# Patient Record
Sex: Male | Born: 1955 | Race: White | Hispanic: No | Marital: Single | Smoking: Never smoker
Health system: Southern US, Community
[De-identification: ages and names within clinical notes are randomized; demographics above are authoritative.]

## PROBLEM LIST (undated history)

## (undated) DIAGNOSIS — N400 Enlarged prostate without lower urinary tract symptoms: Secondary | ICD-10-CM

## (undated) DIAGNOSIS — I10 Essential (primary) hypertension: Secondary | ICD-10-CM

---

## 2006-11-12 ENCOUNTER — Emergency Department: Payer: Self-pay | Admitting: Emergency Medicine

## 2012-11-06 ENCOUNTER — Ambulatory Visit: Payer: Self-pay | Admitting: Otolaryngology

## 2012-11-14 ENCOUNTER — Emergency Department: Payer: Self-pay | Admitting: Emergency Medicine

## 2012-11-14 ENCOUNTER — Ambulatory Visit: Payer: Self-pay | Admitting: Otolaryngology

## 2012-11-14 LAB — URINALYSIS, COMPLETE
Glucose,UR: >=500 mg/dL (ref 0–75)
Ketone: NEGATIVE
Leukocyte Esterase: NEGATIVE
Nitrite: NEGATIVE
Protein: NEGATIVE
RBC,UR: 1 /HPF (ref 0–5)
Squamous Epithelial: NONE SEEN
WBC UR: <1 /HPF (ref 0–5)

## 2012-11-15 LAB — PATHOLOGY REPORT

## 2014-10-30 NOTE — Op Note (Signed)
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   POSTOPERATIVE DIAGNOSIS: right infranasal Schneiderian  papilloma.   PROCEDURE:  Resection right infranasal Schneiderian  papilloma.   SURGEON: [name], MD   DESCRIPTION OF PROCEDURE: The patient was placed in supine position on the operating room table. After general endotracheal anesthesia had been induced, the patient was turned 90 degrees counterclockwise from anesthesia and placed in the beach chair position. The right side of the nose was topically anesthetized with topical lidocaine with phenylephrine foam pledgets x 3.  Local anesthesia was injected around the mass. The patient was then prepped and draped in the usual fashion. A #15 blade was used to resect the mucosa around the lesion. The deep margin of the resection felt very firm and desmoplastic; therefore, an additional margin of cartilage where the upper lateral cartilage articulates with the dorsal septum was resected. There was ossification of the dorsal septum at that point, and it was felt as though this may represent additional desmoplastic response and potential for malignant transformation.  Therefore, these 2 pieces of tissue which were oriented for the pathologist by me were taken down and once again reviewed with the pathologist, and frozen section analysis was negative. Therefore, meticulous hemostasis was achieved in the wound. Surgiflo was placed, followed by a temporary Telfa which was taped to the side of the cheek. The patient was returned to Anesthesia, allowed to emerge from anesthesia in the operating room, and taken to the recovery room in stable condition. There were no complications.  ESTIMATED BLOOD LOSS: 10 mL.   ____________________________ J. Gertie BaronMadison Lola Czerwonka, MD [email or web address]:cb D: 11/14/2012 21:40:10 ET T: 11/14/2012 23:15:52  ET JOB#: [id number]  cc: Zackery BarefootJ. Madison Yarnell Kozloski, MD, <Dictator>

## 2014-10-30 NOTE — Op Note (Signed)
PATIENT NAME:  Cipriano BunkerSTUART, Kell R MR#:  [id number] DATE OF BIRTH:  1956-03-23  DATE OF PROCEDURE:  11/14/2012  PREOPERATIVE DIAGNOSIS: Right intranasal Schneiderian  papilloma.   POSTOPERATIVE DIAGNOSIS: right intranasal Schneiderian  papilloma.   PROCEDURE:  Resection right intranasal Schneiderian  papilloma.   SURGEON: Zackery BarefootJ. Madison Vincy Feliz, MD   DESCRIPTION OF PROCEDURE: The patient was placed in supine position on the operating room table. After general endotracheal anesthesia had been induced, the patient was turned 90 degrees counterclockwise from anesthesia and placed in the beach chair position. The right side of the nose was topically anesthetized with topical lidocaine with phenylephrine foam pledgets x 3.  Local anesthesia was injected around the mass. The patient was then prepped and draped in the usual fashion. A #15 blade was used to resect the mucosa around the lesion. The deep margin of the resection felt very firm and desmoplastic; therefore, an additional margin of cartilage where the upper lateral cartilage articulates with the dorsal septum was resected. There was ossification of the dorsal septum at that point, and it was felt as though this may represent additional desmoplastic response and potential for malignant transformation.  Therefore, these 2 pieces of tissue which were oriented for the pathologist by me were taken down and once again reviewed with the pathologist, and frozen section analysis was negative. Therefore, meticulous hemostasis was achieved in the wound. Surgiflo was placed, followed by a temporary Telfa which was taped to the side of the cheek. The patient was returned to Anesthesia, allowed to emerge from anesthesia in the operating room, and taken to the recovery room in stable condition. There were no complications.  ESTIMATED BLOOD LOSS: 10 mL.   ____________________________ J. Gertie BaronMadison Nelda Luckey, MD [email or web address]:cb D: 11/14/2012 21:40:00 ET T: 11/14/2012 23:15:52  ET JOB#: [id number]  cc: Zackery BarefootJ. Madison Jamyah Folk, MD, <Dictator> Wendee CoppJMADISON Branson Kranz MD ELECTRONICALLY SIGNED 11/26/2012 6:23

## 2015-02-04 ENCOUNTER — Ambulatory Visit
Admission: EM | Admit: 2015-02-04 | Discharge: 2015-02-04 | Disposition: A | Discharge status: Home / Self Care | Payer: BLUE CROSS/BLUE SHIELD | Attending: Internal Medicine | Admitting: Internal Medicine

## 2015-02-04 ENCOUNTER — Encounter: Payer: Self-pay | Admitting: Emergency Medicine

## 2015-02-04 DIAGNOSIS — R519 Headache, unspecified: Secondary | ICD-10-CM

## 2015-02-04 DIAGNOSIS — R51 Headache: Secondary | ICD-10-CM | POA: Diagnosis not present

## 2015-02-04 DIAGNOSIS — G43909 Migraine, unspecified, not intractable, without status migrainosus: Secondary | ICD-10-CM | POA: Diagnosis present

## 2015-02-04 DIAGNOSIS — T673XXA Heat exhaustion, anhydrotic, initial encounter: Secondary | ICD-10-CM

## 2015-02-04 DIAGNOSIS — I10 Essential (primary) hypertension: Secondary | ICD-10-CM | POA: Insufficient documentation

## 2015-02-04 DIAGNOSIS — N400 Enlarged prostate without lower urinary tract symptoms: Secondary | ICD-10-CM | POA: Insufficient documentation

## 2015-02-04 DIAGNOSIS — X58XXXA Exposure to other specified factors, initial encounter: Secondary | ICD-10-CM | POA: Diagnosis not present

## 2015-02-04 DIAGNOSIS — R1110 Vomiting, unspecified: Secondary | ICD-10-CM | POA: Diagnosis present

## 2015-02-04 HISTORY — DX: Benign prostatic hyperplasia without lower urinary tract symptoms: N40.0

## 2015-02-04 HISTORY — DX: Essential (primary) hypertension: I10

## 2015-02-04 LAB — COMPREHENSIVE METABOLIC PANEL
ALT: 18 U/L (ref 17–63)
ANION GAP: 9 (ref 5–15)
AST: 18 U/L (ref 15–41)
Albumin: 4.3 g/dL (ref 3.5–5.0)
Alkaline Phosphatase: 49 U/L (ref 38–126)
BUN: 19 mg/dL (ref 6–20)
CHLORIDE: 99 mmol/L — AB (ref 101–111)
CO2: 27 mmol/L (ref 22–32)
Calcium: 9.3 mg/dL (ref 8.9–10.3)
Creatinine, Ser: 0.84 mg/dL (ref 0.61–1.24)
GFR calc Af Amer: >60 mL/min (ref >60)
GFR calc non Af Amer: >60 mL/min (ref >60)
Glucose, Bld: 166 mg/dL — ABNORMAL HIGH (ref 65–99)
Potassium: 4.4 mmol/L (ref 3.5–5.1)
SODIUM: 135 mmol/L (ref 135–145)
Total Bilirubin: 1.3 mg/dL — ABNORMAL HIGH (ref 0.3–1.2)
Total Protein: 7.6 g/dL (ref 6.5–8.1)

## 2015-02-04 LAB — CBC WITH DIFFERENTIAL/PLATELET
BASOS ABS: 0 10*3/uL (ref 0–0.1)
BASOS PCT: 0 %
EOS PCT: 0 %
Eosinophils Absolute: 0 10*3/uL (ref 0–0.7)
HEMATOCRIT: 41.2 % (ref 40.0–52.0)
Hemoglobin: 14.2 g/dL (ref 13.0–18.0)
LYMPHS ABS: 1 10*3/uL (ref 1.0–3.6)
Lymphocytes Relative: 7 %
MCH: 29.8 pg (ref 26.0–34.0)
MCHC: 34.5 g/dL (ref 32.0–36.0)
MCV: 86.4 fL (ref 80.0–100.0)
MONO ABS: 0.6 10*3/uL (ref 0.2–1.0)
Monocytes Relative: 4 %
NEUTROS PCT: 89 %
Neutro Abs: 11.9 10*3/uL — ABNORMAL HIGH (ref 1.4–6.5)
Platelets: 177 10*3/uL (ref 150–440)
RBC: 4.77 MIL/uL (ref 4.40–5.90)
RDW: 12.7 % (ref 11.5–14.5)
WBC: 13.5 10*3/uL — AB (ref 3.8–10.6)

## 2015-02-04 MED ORDER — KETOROLAC TROMETHAMINE 60 MG/2ML IM SOLN
60.0000 mg | Freq: Once | INTRAMUSCULAR | Status: AC
  Administered 2015-02-04: 60 mg via INTRAMUSCULAR

## 2015-02-04 MED ORDER — ONDANSETRON HCL 4 MG/2ML IJ SOLN
8.0000 mg | Freq: Once | INTRAMUSCULAR | Status: AC
  Administered 2015-02-04: 8 mg via INTRAMUSCULAR

## 2015-02-04 MED ORDER — SODIUM CHLORIDE 0.9 % IV BOLUS (SEPSIS)
1000.0000 mL | Freq: Once | INTRAVENOUS | Status: AC
  Administered 2015-02-04: 1000 mL via INTRAVENOUS

## 2015-02-04 NOTE — ED Notes (Signed)
Pt states that he has a headache and he has a history of them but nothing like the past day.he states that it has caused him to be very sick on his stomach

## 2015-02-04 NOTE — ED Provider Notes (Signed)
CSN: [id number]     Arrival date & time 02/04/15  1352 History   First MD Initiated Contact with Patient 02/04/15 1513     Chief Complaint  Patient presents with  . Migraine  . Emesis   HPI 59 year old gentleman with past medical history notable for hypertension, "sick" headaches. He presents today after the onset of a really bad headache last evening. Headache similar to previous headaches, and actually improving, but has been vomiting since 10 AM today. The vomiting makes his head feel like it's going to blow open. Additional history is that he works in an [employer] site, in the last 3 days have been extremely hot.  No cough, hasn't felt like he was coming down with anything. No diarrhea. He is achy, has some neck soreness, but is able to put his chin on his chest. Headache starts in the back of his head and neck and radiates across the top of his head to the frontal area and down even around the eyes and nose. This is like other headaches that he has had in the past, but they usually only last a couple of hours, and respond to Tylenol. No focal weakness or clumsiness of an arm or leg, has not been falling down, was able to ambulate into the urgent care today. Tactile temperature last night, resolved now. No speaking or swallowing difficulty, no big change in vision.  Past Medical History  Diagnosis Date  . Hypertension   . BPH (benign prostatic hyperplasia)   "sick" headaches  History reviewed. No pertinent past surgical history. History reviewed. No pertinent family history. History  Substance Use Topics  . Smoking status: Never Smoker   . Smokeless tobacco: Not on file  . Alcohol Use: No    Review of Systems  All other systems reviewed and are negative.   Allergies  Review of patient's allergies indicates no known allergies.  Home Medications   Prior to Admission medications   Medication Sig Start Date End Date Taking? Authorizing Provider  amLODipine  (NORVASC) 5 MG tablet Take 5 mg by mouth daily.   Yes Historical Provider, MD  benazepril (LOTENSIN) 20 MG tablet Take 40 mg by mouth daily.   Yes Historical Provider, MD  finasteride (PROSCAR) 5 MG tablet Take 5 mg by mouth daily.   Yes Historical Provider, MD  tamsulosin (FLOMAX) 0.4 MG CAPS capsule Take 0.4 mg by mouth daily.   Yes Historical Provider, MD   BP 112/54 mmHg  Pulse 77  Temp(Src) 98 F (36.7 C) (Oral)  Resp 16  SpO2 99% Physical Exam  Constitutional: He is oriented to person, place, and time.  Alert, nicely groomed Reclining on stretcher  HENT:  Head: Atraumatic.  Eyes:  Conjugate gaze, no eye redness/drainage  Neck: Neck supple.  Able to put chin on chest  Cardiovascular: Normal rate and regular rhythm.   Pulmonary/Chest: No respiratory distress.  Lungs clear, symmetric breath sounds  Abdominal: Soft. He exhibits no distension. There is no tenderness. There is no guarding.  Musculoskeletal: Normal range of motion.  Neurological: He is alert and oriented to person, place, and time.  Face symmetric, speech clear/coherent  Skin: Skin is warm.  No cyanosis Slightly diaphoretic, maybe a little pale  Nursing note and vitals reviewed.  Orthostatic VS for the past 24 hrs:  BP- Lying Pulse- Lying BP- Sitting Pulse- Sitting BP- Standing at 0 minutes Pulse- Standing at 0 minutes  02/04/15 1554 114/65 mmHg 77 123/53 mmHg 83 107/55 mmHg 80  ED Course  Procedures Results for orders placed or performed during the hospital encounter of 02/04/15  Comprehensive metabolic panel  Result Value Ref Range   Sodium 135 135 - 145 mmol/L   Potassium 4.4 3.5 - 5.1 mmol/L   Chloride 99 (L) 101 - 111 mmol/L   CO2 27 22 - 32 mmol/L   Glucose, Bld 166 (H) 65 - 99 mg/dL   BUN 19 6 - 20 mg/dL   Creatinine, Ser 1.61 0.61 - 1.24 mg/dL   Calcium 9.3 8.9 - 09.6 mg/dL   Total Protein 7.6 6.5 - 8.1 g/dL   Albumin 4.3 3.5 - 5.0 g/dL   AST 18 15 - 41 U/L   ALT 18 17 - 63 U/L    Alkaline Phosphatase 49 38 - 126 U/L   Total Bilirubin 1.3 (H) 0.3 - 1.2 mg/dL   GFR calc non Af Amer >60 >60 mL/min   GFR calc Af Amer >60 >60 mL/min   Anion gap 9 5 - 15  CBC with Differential  Result Value Ref Range   WBC 13.5 (H) 3.8 - 10.6 K/uL   RBC 4.77 4.40 - 5.90 MIL/uL   Hemoglobin 14.2 13.0 - 18.0 g/dL   HCT 04.5 40.9 - 81.1 %   MCV 86.4 80.0 - 100.0 fL   MCH 29.8 26.0 - 34.0 pg   MCHC 34.5 32.0 - 36.0 g/dL   RDW 91.4 78.2 - 95.6 %   Platelets 177 150 - 440 K/uL   Neutrophils Relative % 89 %   Neutro Abs 11.9 (H) 1.4 - 6.5 K/uL   Lymphocytes Relative 7 %   Lymphs Abs 1.0 1.0 - 3.6 K/uL   Monocytes Relative 4 %   Monocytes Absolute 0.6 0.2 - 1.0 K/uL   Eosinophils Relative 0 %   Eosinophils Absolute 0.0 0 - 0.7 K/uL   Basophils Relative 0 %   Basophils Absolute 0.0 0 - 0.1 K/uL     Medications  ketorolac (TORADOL) injection 60 mg (60 mg Intramuscular Given 02/04/15 1442)  ondansetron (ZOFRAN) injection 8 mg (8 mg Intramuscular Given 02/04/15 1441)  sodium chloride 0.9 % bolus 1,000 mL (1,000 mLs Intravenous Given 02/04/15 1553)  sodium chloride 0.9 % bolus 1,000 mL (1,000 mLs Intravenous Given 02/04/15 1636)       MDM   1. Headache disorder   2. Heat exhaustion due to water depletion, initial encounter    Push fluids, rest.  Note for work tomorrow. Recheck if not continuing to improve in the next few days.    Eustace Moore, MD 02/04/15 2022

## 2015-02-04 NOTE — Discharge Instructions (Signed)
Injections of toradol (ketorolac, an anti-inflammatory) and zofran (ondansetron, for vomiting) were given today. 2 liters of normal saline iv fluid was given today. Rest and push fluids today and tomorrow; can return to work 02/06/15 if feeling ok. You should be drinking enough liquid, when it is hot outside, that urine is clear/pale yellow.  Heat-Related Illness Heat-related illnesses occur when the body is unable to properly cool itself. The body normally cools itself by sweating. However, under some conditions sweating is not enough. In these cases, a person's body temperature rises rapidly. Very high body temperatures may damage the brain or other vital organs. Some examples of heat-related illnesses include:  Heat stroke. This occurs when the body is unable to regulate its temperature. The body's temperature rises rapidly, the sweating mechanism fails, and the body is unable to cool down. Body temperature may rise to 106 F (41 C) or higher within 10 to 15 minutes. Heat stroke can cause death or permanent disability if emergency treatment is not provided.  Heat exhaustion. This is a milder form of heat-related illness that can develop after several days of exposure to high temperatures and not enough fluids. It is the body's response to an excessive loss of the water and salt contained in sweat.  Heat cramps. These usually affect people who sweat a lot during heavy activity. This sweating drains the body's salt and moisture. The low salt level in the muscles causes painful cramps. Heat cramps may also be a symptom of heat exhaustion. Heat cramps usually occur in the abdomen, arms, or legs. Get medical attention for cramps if you have heart problems or are on a low-sodium diet. Those that are at greatest risk for heat-related illnesses include:   The elderly.  Infant and the very young.  People with mental illness and chronic diseases.  People who are overweight (obese).  Young and healthy  people can even succumb to heat if they participate in strenuous physical activities during hot weather. CAUSES  Several factors affect the body's ability to cool itself during extremely hot weather. When the humidity is high, sweat will not evaporate as quickly. This prevents the body from releasing heat quickly. Other factors that can affect the body's ability to cool down include:   Age.  Obesity.  Fever.  Dehydration.  Heart disease.  Mental illness.  Poor circulation.  Sunburn.  Prescription drug use.  Alcohol use. SYMPTOMS  Heat stroke: Warning signs of heat stroke vary, but may include:  An extremely high body temperature (above 103F orally).  A fast, strong pulse.  Dizziness.  Confusion.  Red, hot, and dry skin.  No sweating.  Throbbing headache.  Feeling sick to your stomach (nauseous).  Unconsciousness. Heat exhaustion: Warning signs of heat exhaustion include:  Heavy sweating.  Tiredness.  Headache.  Paleness.  Weakness.  Feeling sick to your stomach (nauseous) or vomiting.  Muscle cramps. Heat cramps  Muscle pains or spasms. TREATMENT  Heat stroke  Get into a cool environment. An indoor place that is air-conditioned may be best.  Take a cool shower or bath. Have someone around to make sure you are okay.  Take your temperature. Make sure it is going down. Heat exhaustion  Drink plenty of fluids. Do not drink liquids that contain caffeine, alcohol, or large amounts of sugar. These cause you to lose more body fluid. Also, avoid very cold drinks. They can cause stomach cramps.  Get into a cool environment. An indoor place that is air-conditioned may be best.  Take a cool shower or bath. Have someone around to make sure you are okay.  Put on lightweight clothing. Heat cramps  Stop whatever activity you were doing. Do not attempt to do that activity for at least 3 hours after the cramps have gone away.  Get into a cool  environment. An indoor place that is air-conditioned may be best. HOME CARE INSTRUCTIONS  To protect your health when temperatures are extremely high, follow these tips:  During heavy exercise in a hot environment, drink two to four glasses (16-32 ounces) of cool fluids each hour. Do not wait until you are thirsty to drink. Warning: If your caregiver limits the amount of fluid you drink or has you on water pills, ask how much you should drink while the weather is hot.  Do not drink liquids that contain caffeine, alcohol, or large amounts of sugar. These cause you to lose more body fluid.  Avoid very cold drinks. They can cause stomach cramps.  Wear appropriate clothing. Choose lightweight, light-colored, loose-fitting clothing.  If you must be outdoors, try to limit your outdoor activity to morning and evening hours. Try to rest often in shady areas.  If you are not used to working or exercising in a hot environment, start slowly and pick up the pace gradually.  Stay cool in an air-conditioned place if possible. If your home does not have air conditioning, go to the shopping mall or [employer].  Taking a cool shower or bath may help you cool off. SEEK MEDICAL CARE IF:   You see any of the symptoms listed above. You may be dealing with a life-threatening emergency.  Symptoms worsen or last longer than 1 hour.  Heat cramps do not get better in 1 hour. MAKE SURE YOU:   Understand these instructions.  Will watch your condition.  Will get help right away if you are not doing well or get worse. Document Released: 04/04/2008 Document Revised: 09/18/2011 Document Reviewed: 04/04/2008 [facility] Patient Information 2015 [place], [place]. This information is not intended to replace advice given to you by your health care provider. Make sure you discuss any questions you have with your health care provider.

## 2015-02-12 ENCOUNTER — Encounter: Payer: Self-pay | Admitting: Family Medicine

## 2015-02-12 ENCOUNTER — Ambulatory Visit (INDEPENDENT_AMBULATORY_CARE_PROVIDER_SITE_OTHER): Payer: BLUE CROSS/BLUE SHIELD | Admitting: Family Medicine

## 2015-02-12 VITALS — BP 125/73 | HR 65 | Temp 99.1°F | Ht 69.2 in | Wt 198.4 lb

## 2015-02-12 DIAGNOSIS — R5383 Other fatigue: Secondary | ICD-10-CM | POA: Diagnosis not present

## 2015-02-12 DIAGNOSIS — R519 Headache, unspecified: Secondary | ICD-10-CM

## 2015-02-12 DIAGNOSIS — R51 Headache: Secondary | ICD-10-CM

## 2015-02-12 DIAGNOSIS — M791 Myalgia, unspecified site: Secondary | ICD-10-CM

## 2015-02-12 DIAGNOSIS — J0100 Acute maxillary sinusitis, unspecified: Secondary | ICD-10-CM | POA: Diagnosis not present

## 2015-02-12 LAB — CBC WITH DIFFERENTIAL/PLATELET
HEMATOCRIT: 39.8 % (ref 37.5–51.0)
Hemoglobin: 14.3 g/dL (ref 12.6–17.7)
LYMPHS: 23 %
Lymphocytes Absolute: 2 10*3/uL (ref 0.7–3.1)
MCH: 31.5 pg (ref 26.6–33.0)
MCHC: 35.9 g/dL — ABNORMAL HIGH (ref 31.5–35.7)
MCV: 88 fL (ref 79–97)
MID (Absolute): 0.5 10*3/uL (ref 0.1–1.6)
MID: 6 %
NEUTROS ABS: 6.1 10*3/uL (ref 1.4–7.0)
Neutrophils: 71 %
Platelets: 217 10*3/uL (ref 150–379)
RBC: 4.54 x10E6/uL (ref 4.14–5.80)
RDW: 12.6 % (ref 12.3–15.4)
WBC: 8.6 10*3/uL (ref 3.4–10.8)

## 2015-02-12 MED ORDER — AMOXICILLIN-POT CLAVULANATE 875-125 MG PO TABS: 1.0000 | ORAL_TABLET | Freq: Two times a day (BID) | ORAL | Status: DC

## 2015-02-12 NOTE — Progress Notes (Signed)
BP 125/73 mmHg  Pulse 65  Temp(Src) 99.1 F (37.3 C)  Ht 5' 9.2" (1.758 m)  Wt 198 lb 6.4 oz (89.994 kg)  BMI 29.12 kg/m2  SpO2 99%   Subjective:    Patient ID: Scott Hester, male    DOB: [birth year], 59 y.o.   MRN: [id number]  HPI: Scott Hester is a 59 y.o. male  Chief Complaint  Patient presents with  . Heat Exposure    patient states that he still does not feel well, he started vomiting again yesterday. He had a bad headache on Wednesday night, after the headache went away he started vomiting, which lasted about 12 hours.Went to UC, did x-ray, blood work, and was given fluid this past  Sunday . He also states that his legs are very sore and stiff when he stretches it out  . URI    Sinus pressure, ear ache, humming in ear, unsure if this may be contributing to the other issues.    UPPER RESPIRATORY TRACT INFECTION X 1-2 WEEKS Worst symptom: headache Fever: yes Cough: yes Shortness of breath: no Wheezing: no Chest pain: no Chest tightness: no Chest congestion: no Nasal congestion: yes Runny nose: yes Post nasal drip: yes Sneezing: no Sore throat: no Swollen glands: no Sinus pressure: yes Headache: yes Face pain: yes Toothache: yes Ear pain: no  Ear pressure: yes "right Eyes red/itching:no Eye drainage/crusting: no  Vomiting: yes Rash: no Fatigue: yes Sick contacts: no Strep contacts: no  Context: stable Recurrent sinusitis: no Relief with OTC cold/cough medications: no  Treatments attempted: none   Has been vomiting and not feeling good. Bad headaches. Works in the heat. No air conditioning at work.    Relevant past medical, surgical, family and social history reviewed and updated as indicated. Interim medical history since our last visit reviewed. Allergies and medications reviewed and updated.  Review of Systems  Constitutional: Negative.   Respiratory: Negative.   Cardiovascular: Negative.   Gastrointestinal: Positive for nausea and vomiting.  Negative for abdominal pain, diarrhea, constipation, blood in stool, abdominal distention, anal bleeding and rectal pain.  Musculoskeletal: Positive for myalgias and arthralgias. Negative for back pain, joint swelling, gait problem, neck pain and neck stiffness.  Psychiatric/Behavioral: Negative.     Per HPI unless specifically indicated above     Objective:    BP 125/73 mmHg  Pulse 65  Temp(Src) 99.1 F (37.3 C)  Ht 5' 9.2" (1.758 m)  Wt 198 lb 6.4 oz (89.994 kg)  BMI 29.12 kg/m2  SpO2 99%  Wt Readings from Last 3 Encounters:  02/12/15 198 lb 6.4 oz (89.994 kg)  09/28/14 211 lb (95.709 kg)    Physical Exam  Constitutional: He is oriented to person, place, and time. He appears well-developed and well-nourished. No distress.  HENT:  Head: Normocephalic and atraumatic.  Right Ear: Hearing normal.  Left Ear: Hearing normal.  Nose: Nose normal.  Eyes: Conjunctivae and lids are normal. Right eye exhibits no discharge. Left eye exhibits no discharge. No scleral icterus.  Cardiovascular: Normal rate, regular rhythm and normal heart sounds.  Exam reveals no gallop and no friction rub.   No murmur heard. Pulmonary/Chest: Effort normal and breath sounds normal. No respiratory distress. He has no wheezes. He has no rales. He exhibits no tenderness.  Abdominal: Soft. Bowel sounds are normal. He exhibits no distension and no mass. There is no tenderness. There is no rebound and no guarding.  Musculoskeletal: Normal range of motion.  Neurological: He is  alert and oriented to person, place, and time.  Skin: Skin is warm, dry and intact. No rash noted. No erythema. No pallor.  Psychiatric: He has a normal mood and affect. His speech is normal and behavior is normal. Judgment and thought content normal. Cognition and memory are normal.  Nursing note and vitals reviewed.   Results for orders placed or performed in visit on 02/12/15  CBC With Differential/Platelet  Result Value Ref Range    WBC 8.6 3.4 - 10.8 x10E3/uL   RBC 4.54 4.14 - 5.80 x10E6/uL   Hemoglobin 14.3 12.6 - 17.7 g/dL   Hematocrit 16.1 09.6 - 51.0 %   MCV 88 79 - 97 fL   MCH 31.5 26.6 - 33.0 pg   MCHC 35.9 (H) 31.5 - 35.7 g/dL   RDW 04.5 40.9 - 81.1 %   Platelets 217 150 - 379 x10E3/uL   Neutrophils 71 %   Lymphs 23 %   MID 6 %   Neutrophils Absolute 6.1 1.4 - 7.0 x10E3/uL   Lymphocytes Absolute 2.0 0.7 - 3.1 x10E3/uL   MID (Absolute) 0.5 0.1 - 1.6 X10E3/uL      Assessment & Plan:   Problem List Items Addressed This Visit    None    Visit Diagnoses    Acute maxillary sinusitis, recurrence not specified    -  Primary    Will treat with augmentin. Call if not getting better or getting worse.     Relevant Medications    amoxicillin-clavulanate (AUGMENTIN) 875-125 MG per tablet    Acute nonintractable headache, unspecified headache type        Possibly due to heat or due to sinusitis. Will check blood work including tick bourne illnesses. Call if not getting better.     Relevant Orders    Comprehensive metabolic panel    TSH    CBC With Differential/Platelet (Completed)    Lyme Ab/Western Blot Reflex    Babesia microti Antibody Panel    Ehrlichia Antibody Panel    Rocky mtn spotted fvr abs pnl(IgG+IgM)    Carboxyhemoglobin    Other fatigue        Advised him to push fluids and rest over the weekend.     Relevant Orders    Comprehensive metabolic panel    TSH    CBC With Differential/Platelet (Completed)    Lyme Ab/Western Blot Reflex    Babesia microti Antibody Panel    Ehrlichia Antibody Panel    Rocky mtn spotted fvr abs pnl(IgG+IgM)    Myalgia        Will check for tick bourne illnesses.     Relevant Orders    Comprehensive metabolic panel    TSH    CBC With Differential/Platelet (Completed)    Lyme Ab/Western Blot Reflex    Babesia microti Antibody Panel    Ehrlichia Antibody Panel    Rocky mtn spotted fvr abs pnl(IgG+IgM)        Follow up plan: No Follow-up on  file.

## 2015-02-13 LAB — COMPREHENSIVE METABOLIC PANEL
A/G RATIO: 1.6 (ref 1.1–2.5)
ALK PHOS: 45 IU/L (ref 39–117)
ALT: 12 IU/L (ref 0–44)
AST: 6 IU/L (ref 0–40)
Albumin: 4.1 g/dL (ref 3.5–5.5)
BILIRUBIN TOTAL: 0.7 mg/dL (ref 0.0–1.2)
BUN/Creatinine Ratio: 14 (ref 9–20)
BUN: 9 mg/dL (ref 6–24)
CHLORIDE: 98 mmol/L (ref 97–108)
CO2: 29 mmol/L (ref 18–29)
Calcium: 9.3 mg/dL (ref 8.7–10.2)
Creatinine, Ser: 0.66 mg/dL — ABNORMAL LOW (ref 0.76–1.27)
GFR calc non Af Amer: 107 mL/min/{1.73_m2} (ref >59)
GFR, EST AFRICAN AMERICAN: 123 mL/min/{1.73_m2} (ref >59)
Globulin, Total: 2.6 g/dL (ref 1.5–4.5)
Glucose: 135 mg/dL — ABNORMAL HIGH (ref 65–99)
Potassium: 5 mmol/L (ref 3.5–5.2)
SODIUM: 140 mmol/L (ref 134–144)
TOTAL PROTEIN: 6.7 g/dL (ref 6.0–8.5)

## 2015-02-15 ENCOUNTER — Encounter: Payer: Self-pay | Admitting: Family Medicine

## 2015-02-15 LAB — LYME AB/WESTERN BLOT REFLEX
LYME DISEASE AB, QUANT, IGM: <0.8 index (ref 0.00–0.79)
Lyme IgG/IgM Ab: <0.91 {ISR} (ref 0.00–0.90)

## 2015-02-15 LAB — CARBOXYHEMOGLOBIN: Carbon Monoxide, Blood: 3 % — ABNORMAL HIGH (ref 0.0–1.9)

## 2015-02-16 LAB — ROCKY MTN SPOTTED FVR ABS PNL(IGG+IGM)
RMSF IgG: NEGATIVE
RMSF IgM: 0.24 index (ref 0.00–0.89)

## 2015-02-16 LAB — EHRLICHIA ANTIBODY PANEL
E. CHAFFEENSIS (HME) IGM TITER: NEGATIVE
E. CHAFFEENSIS IGG AB: NEGATIVE
HGE IGM TITER: NEGATIVE
HGE IgG Titer: NEGATIVE

## 2015-02-17 ENCOUNTER — Encounter: Payer: Self-pay | Admitting: Family Medicine

## 2015-02-17 ENCOUNTER — Telehealth: Payer: Self-pay | Admitting: Family Medicine

## 2015-02-17 LAB — BABESIA MICROTI ANTIBODY PANEL

## 2015-02-17 NOTE — Telephone Encounter (Signed)
No answer, unable to leave message °

## 2015-02-17 NOTE — Telephone Encounter (Signed)
Please let Caileb know that his labs came back normal. Thanks!

## 2015-02-17 NOTE — Telephone Encounter (Signed)
Letter generated and sent to patient with normal results.

## 2015-04-15 ENCOUNTER — Encounter: Payer: Self-pay | Admitting: Family Medicine

## 2015-04-15 ENCOUNTER — Ambulatory Visit (INDEPENDENT_AMBULATORY_CARE_PROVIDER_SITE_OTHER): Payer: BLUE CROSS/BLUE SHIELD | Admitting: Family Medicine

## 2015-04-15 VITALS — BP 113/67 | HR 56 | Temp 97.7°F | Ht 69.0 in | Wt 190.0 lb

## 2015-04-15 DIAGNOSIS — Z0000 Encounter for general adult medical examination without abnormal findings: Secondary | ICD-10-CM | POA: Diagnosis not present

## 2015-04-15 DIAGNOSIS — N401 Enlarged prostate with lower urinary tract symptoms: Secondary | ICD-10-CM | POA: Diagnosis not present

## 2015-04-15 DIAGNOSIS — I10 Essential (primary) hypertension: Secondary | ICD-10-CM | POA: Diagnosis not present

## 2015-04-15 DIAGNOSIS — N138 Other obstructive and reflux uropathy: Secondary | ICD-10-CM

## 2015-04-15 DIAGNOSIS — E1159 Type 2 diabetes mellitus with other circulatory complications: Secondary | ICD-10-CM | POA: Insufficient documentation

## 2015-04-15 LAB — URINALYSIS, ROUTINE W REFLEX MICROSCOPIC
BILIRUBIN UA: NEGATIVE
Glucose, UA: NEGATIVE
KETONES UA: NEGATIVE
Leukocytes, UA: NEGATIVE
NITRITE UA: NEGATIVE
Protein, UA: NEGATIVE
RBC UA: NEGATIVE
SPEC GRAV UA: 1.015 (ref 1.005–1.030)
Urobilinogen, Ur: 0.2 mg/dL (ref 0.2–1.0)
pH, UA: 7 (ref 5.0–7.5)

## 2015-04-15 MED ORDER — TAMSULOSIN HCL 0.4 MG PO CAPS: 0.4000 mg | ORAL_CAPSULE | Freq: Every day | ORAL | Status: DC

## 2015-04-15 MED ORDER — BENAZEPRIL HCL 40 MG PO TABS: 40.0000 mg | ORAL_TABLET | Freq: Every day | ORAL | Status: DC

## 2015-04-15 MED ORDER — FINASTERIDE 5 MG PO TABS: 5.0000 mg | ORAL_TABLET | Freq: Every day | ORAL | Status: DC

## 2015-04-15 MED ORDER — AMLODIPINE BESYLATE 5 MG PO TABS: 5.0000 mg | ORAL_TABLET | Freq: Every day | ORAL | Status: DC

## 2015-04-15 NOTE — Assessment & Plan Note (Signed)
The current medical regimen is effective;  continue present plan and medications.  

## 2015-04-15 NOTE — Progress Notes (Signed)
BP 113/67 mmHg  Pulse 56  Temp(Src) 97.7 F (36.5 C)  Ht  (1.753 m)  Wt 190 lb (86.183 kg)  BMI 28.05 kg/m2  SpO2 99%   Subjective:    Patient ID: Scott Hester, male    DOB: 13-Aug-1955, 59 y.o.   MRN: [id number]  HPI: Scott Hester is a 59 y.o. male  Chief Complaint  Patient presents with  . Annual Exam   patient also taken medicines for blood pressure and BPH doing well with no complaints from medicines   Relevant past medical, surgical, family and social history reviewed and updated as indicated. Interim medical history since our last visit reviewed. Allergies and medications reviewed and updated.  Review of Systems  Constitutional: Negative.   HENT: Negative.   Eyes: Negative.   Respiratory: Negative.   Cardiovascular: Negative.   Gastrointestinal: Negative.   Endocrine: Negative.   Genitourinary: Negative.   Musculoskeletal: Negative.   Skin: Negative.   Allergic/Immunologic: Negative.   Neurological: Negative.   Hematological: Negative.   Psychiatric/Behavioral: Negative.     Per HPI unless specifically indicated above     Objective:    BP 113/67 mmHg  Pulse 56  Temp(Src) 97.7 F (36.5 C)  Ht  (1.753 m)  Wt 190 lb (86.183 kg)  BMI 28.05 kg/m2  SpO2 99%  Wt Readings from Last 3 Encounters:  04/15/15 190 lb (86.183 kg)  02/12/15 198 lb 6.4 oz (89.994 kg)  09/28/14 211 lb (95.709 kg)    Physical Exam  Constitutional: He is oriented to person, place, and time. He appears well-developed and well-nourished.  HENT:  Head: Normocephalic and atraumatic.  Right Ear: External ear normal.  Left Ear: External ear normal.  Eyes: Conjunctivae and EOM are normal. Pupils are equal, round, and reactive to light.  Neck: Normal range of motion. Neck supple.  Cardiovascular: Normal rate, regular rhythm, normal heart sounds and intact distal pulses.   Pulmonary/Chest: Effort normal and breath sounds normal.  Abdominal: Soft. Bowel sounds are normal.  There is no splenomegaly or hepatomegaly.  Genitourinary: Rectum normal and penis normal.  Enlarged prostate  Musculoskeletal: Normal range of motion.  Neurological: He is alert and oriented to person, place, and time. He has normal reflexes.  Skin: No rash noted. No erythema.  Psychiatric: He has a normal mood and affect. His behavior is normal. Judgment and thought content normal.    Results for orders placed or performed in visit on 02/12/15  Comprehensive metabolic panel  Result Value Ref Range   Glucose 135 (H) 65 - 99 mg/dL   BUN 9 6 - 24 mg/dL   Creatinine, Ser 8.41 (L) 0.76 - 1.27 mg/dL   GFR calc non Af Amer 107 >59 mL/min/1.73   GFR calc Af Amer 123 >59 mL/min/1.73   BUN/Creatinine Ratio 14 9 - 20   Sodium 140 134 - 144 mmol/L   Potassium 5.0 3.5 - 5.2 mmol/L   Chloride 98 97 - 108 mmol/L   CO2 29 18 - 29 mmol/L   Calcium 9.3 8.7 - 10.2 mg/dL   Total Protein 6.7 6.0 - 8.5 g/dL   Albumin 4.1 3.5 - 5.5 g/dL   Globulin, Total 2.6 1.5 - 4.5 g/dL   Albumin/Globulin Ratio 1.6 1.1 - 2.5   Bilirubin Total 0.7 0.0 - 1.2 mg/dL   Alkaline Phosphatase 45 39 - 117 IU/L   AST 6 0 - 40 IU/L   ALT 12 0 - 44 IU/L  CBC With  Differential/Platelet  Result Value Ref Range   WBC 8.6 3.4 - 10.8 x10E3/uL   RBC 4.54 4.14 - 5.80 x10E6/uL   Hemoglobin 14.3 12.6 - 17.7 g/dL   Hematocrit 71.6 96.7 - 51.0 %   MCV 88 79 - 97 fL   MCH 31.5 26.6 - 33.0 pg   MCHC 35.9 (H) 31.5 - 35.7 g/dL   RDW 89.3 81.0 - 17.5 %   Platelets 217 150 - 379 x10E3/uL   Neutrophils 71 %   Lymphs 23 %   MID 6 %   Neutrophils Absolute 6.1 1.4 - 7.0 x10E3/uL   Lymphocytes Absolute 2.0 0.7 - 3.1 x10E3/uL   MID (Absolute) 0.5 0.1 - 1.6 X10E3/uL  Lyme Ab/Western Blot Reflex  Result Value Ref Range   Lyme IgG/IgM Ab <0.91 0.00 - 0.90 ISR   LYME DISEASE AB, QUANT, IGM <0.80 0.00 - 0.79 index  Babesia microti Antibody Panel  Result Value Ref Range   Babesia microti IgM <1:10 Neg:<1:10   Babesia microti IgG <1:10  Neg:<1:10  Ehrlichia Antibody Panel  Result Value Ref Range   E.Chaffeensis (HME) IgG Negative Neg:<1:64   E. Chaffeensis (HME) IgM Titer Negative Neg:<1:20   HGE IgG Titer Negative Neg:<1:64   HGE IgM Titer Negative Neg:<1:20  Rocky mtn spotted fvr abs pnl(IgG+IgM)  Result Value Ref Range   RMSF IgG Negative Negative   RMSF IgM 0.24 0.00 - 0.89 index  Carboxyhemoglobin  Result Value Ref Range   Carbon Monoxide, Blood 3.0 (H) 0.0 - 1.9 %      Assessment & Plan:   Problem List Items Addressed This Visit      Cardiovascular and Mediastinum   Essential hypertension    The current medical regimen is effective;  continue present plan and medications.       Relevant Medications   benazepril (LOTENSIN) 40 MG tablet   amLODipine (NORVASC) 5 MG tablet     Genitourinary   BPH with obstruction/lower urinary tract symptoms    The current medical regimen is effective;  continue present plan and medications.       Relevant Medications   tamsulosin (FLOMAX) 0.4 MG CAPS capsule   finasteride (PROSCAR) 5 MG tablet    Other Visit Diagnoses    PE (physical exam), annual    -  Primary    Relevant Orders    Comprehensive metabolic panel    Lipid panel    CBC with Differential/Platelet    PSA    Urinalysis, Routine w reflex microscopic (not at [facility])    TSH        Follow up plan: Return in about 6 months (around 10/14/2015), or if symptoms worsen or fail to improve, for BMP.

## 2015-04-16 ENCOUNTER — Encounter: Payer: Self-pay | Admitting: Family Medicine

## 2015-04-16 LAB — COMPREHENSIVE METABOLIC PANEL
ALBUMIN: 4.3 g/dL (ref 3.5–5.5)
ALK PHOS: 47 IU/L (ref 39–117)
ALT: 13 IU/L (ref 0–44)
AST: 14 IU/L (ref 0–40)
Albumin/Globulin Ratio: 1.5 (ref 1.1–2.5)
BUN/Creatinine Ratio: 13 (ref 9–20)
BUN: 11 mg/dL (ref 6–24)
Bilirubin Total: 1 mg/dL (ref 0.0–1.2)
CO2: 26 mmol/L (ref 18–29)
CREATININE: 0.85 mg/dL (ref 0.76–1.27)
Calcium: 9.4 mg/dL (ref 8.7–10.2)
Chloride: 98 mmol/L (ref 97–108)
GFR calc Af Amer: 111 mL/min/{1.73_m2} (ref >59)
GFR, EST NON AFRICAN AMERICAN: 96 mL/min/{1.73_m2} (ref >59)
GLOBULIN, TOTAL: 2.8 g/dL (ref 1.5–4.5)
Glucose: 125 mg/dL — ABNORMAL HIGH (ref 65–99)
Potassium: 4.5 mmol/L (ref 3.5–5.2)
SODIUM: 140 mmol/L (ref 134–144)
Total Protein: 7.1 g/dL (ref 6.0–8.5)

## 2015-04-16 LAB — CBC WITH DIFFERENTIAL/PLATELET
BASOS ABS: 0 10*3/uL (ref 0.0–0.2)
BASOS: 0 %
EOS (ABSOLUTE): 0.2 10*3/uL (ref 0.0–0.4)
Eos: 3 %
Hematocrit: 42.6 % (ref 37.5–51.0)
Hemoglobin: 14.4 g/dL (ref 12.6–17.7)
IMMATURE GRANS (ABS): 0 10*3/uL (ref 0.0–0.1)
Immature Granulocytes: 0 %
LYMPHS: 29 %
Lymphocytes Absolute: 1.7 10*3/uL (ref 0.7–3.1)
MCH: 29.9 pg (ref 26.6–33.0)
MCHC: 33.8 g/dL (ref 31.5–35.7)
MCV: 88 fL (ref 79–97)
MONOS ABS: 0.4 10*3/uL (ref 0.1–0.9)
Monocytes: 7 %
Neutrophils Absolute: 3.6 10*3/uL (ref 1.4–7.0)
Neutrophils: 61 %
PLATELETS: 192 10*3/uL (ref 150–379)
RBC: 4.82 x10E6/uL (ref 4.14–5.80)
RDW: 13.3 % (ref 12.3–15.4)
WBC: 5.9 10*3/uL (ref 3.4–10.8)

## 2015-04-16 LAB — LIPID PANEL
CHOL/HDL RATIO: 4.7 ratio (ref 0.0–5.0)
CHOLESTEROL TOTAL: 182 mg/dL (ref 100–199)
HDL: 39 mg/dL — ABNORMAL LOW (ref >39)
LDL CALC: 121 mg/dL — AB (ref 0–99)
Triglycerides: 111 mg/dL (ref 0–149)
VLDL CHOLESTEROL CAL: 22 mg/dL (ref 5–40)

## 2015-04-16 LAB — PSA: PROSTATE SPECIFIC AG, SERUM: 0.9 ng/mL (ref 0.0–4.0)

## 2015-04-16 LAB — TSH: TSH: 1.11 u[IU]/mL (ref 0.450–4.500)

## 2015-10-21 ENCOUNTER — Encounter: Payer: Self-pay | Admitting: Family Medicine

## 2015-10-21 ENCOUNTER — Ambulatory Visit (INDEPENDENT_AMBULATORY_CARE_PROVIDER_SITE_OTHER): Payer: BLUE CROSS/BLUE SHIELD | Admitting: Family Medicine

## 2015-10-21 VITALS — BP 114/69 | HR 61 | Temp 97.5°F | Ht 68.8 in | Wt 198.0 lb

## 2015-10-21 DIAGNOSIS — N401 Enlarged prostate with lower urinary tract symptoms: Secondary | ICD-10-CM

## 2015-10-21 DIAGNOSIS — N138 Other obstructive and reflux uropathy: Secondary | ICD-10-CM

## 2015-10-21 DIAGNOSIS — I10 Essential (primary) hypertension: Secondary | ICD-10-CM | POA: Diagnosis not present

## 2015-10-21 NOTE — Assessment & Plan Note (Signed)
The current medical regimen is effective;  continue present plan and medications.  

## 2015-10-21 NOTE — Progress Notes (Signed)
BP 114/69 mmHg  Pulse 61  Temp(Src) 97.5 F (36.4 C)  Ht 5' 8.8" (1.748 m)  Wt 198 lb (89.812 kg)  BMI 29.39 kg/m2  SpO2 96%   Subjective:    Patient ID: Scott Hester, male    DOB: 03-03-1956, 60 y.o.   MRN: [id number]  HPI: Scott Hester is a 60 y.o. male  Chief Complaint  Patient presents with  . Hypertension  . URI  A she'll recheck blood pressure doing well no complaints takes amlodipine Benzapril without problems takes faithfully good control of blood pressure no side effects. BPH symptoms well controlled no side effects or issues with medications takes faithfully. Patient having some chest congestion for the last 2-3 days not really bad and a mild cough fever or chills  Relevant past medical, surgical, family and social history reviewed and updated as indicated. Interim medical history since our last visit reviewed. Allergies and medications reviewed and updated.  Review of Systems  Constitutional: Negative.   Respiratory: Positive for cough. Negative for choking and chest tightness.   Cardiovascular: Negative.     Per HPI unless specifically indicated above     Objective:    BP 114/69 mmHg  Pulse 61  Temp(Src) 97.5 F (36.4 C)  Ht 5' 8.8" (1.748 m)  Wt 198 lb (89.812 kg)  BMI 29.39 kg/m2  SpO2 96%  Wt Readings from Last 3 Encounters:  10/21/15 198 lb (89.812 kg)  04/15/15 190 lb (86.183 kg)  02/12/15 198 lb 6.4 oz (89.994 kg)    Physical Exam  Constitutional: He is oriented to person, place, and time. He appears well-developed and well-nourished. No distress.  HENT:  Head: Normocephalic and atraumatic.  Right Ear: Hearing normal.  Left Ear: Hearing normal.  Nose: Nose normal.  Eyes: Conjunctivae and lids are normal. Right eye exhibits no discharge. Left eye exhibits no discharge. No scleral icterus.  Cardiovascular: Normal rate, regular rhythm and normal heart sounds.   Pulmonary/Chest: Effort normal and breath sounds normal. No respiratory  distress.  Musculoskeletal: Normal range of motion.  Neurological: He is alert and oriented to person, place, and time.  Skin: Skin is intact. No rash noted.  Psychiatric: He has a normal mood and affect. His speech is normal and behavior is normal. Judgment and thought content normal. Cognition and memory are normal.    Results for orders placed or performed in visit on 04/15/15  Comprehensive metabolic panel  Result Value Ref Range   Glucose 125 (H) 65 - 99 mg/dL   BUN 11 6 - 24 mg/dL   Creatinine, Ser 3.080.85 0.76 - 1.27 mg/dL   GFR calc non Af Amer 96 >59 mL/min/1.73   GFR calc Af Amer 111 >59 mL/min/1.73   BUN/Creatinine Ratio 13 9 - 20   Sodium 140 134 - 144 mmol/L   Potassium 4.5 3.5 - 5.2 mmol/L   Chloride 98 97 - 108 mmol/L   CO2 26 18 - 29 mmol/L   Calcium 9.4 8.7 - 10.2 mg/dL   Total Protein 7.1 6.0 - 8.5 g/dL   Albumin 4.3 3.5 - 5.5 g/dL   Globulin, Total 2.8 1.5 - 4.5 g/dL   Albumin/Globulin Ratio 1.5 1.1 - 2.5   Bilirubin Total 1.0 0.0 - 1.2 mg/dL   Alkaline Phosphatase 47 39 - 117 IU/L   AST 14 0 - 40 IU/L   ALT 13 0 - 44 IU/L  Lipid panel  Result Value Ref Range   Cholesterol, Total 182 100 -  199 mg/dL   Triglycerides 045 0 - 149 mg/dL   HDL 39 (L) >40 mg/dL   VLDL Cholesterol Cal 22 5 - 40 mg/dL   LDL Calculated 981 (H) 0 - 99 mg/dL   Chol/HDL Ratio 4.7 0.0 - 5.0 ratio units  CBC with Differential/Platelet  Result Value Ref Range   WBC 5.9 3.4 - 10.8 x10E3/uL   RBC 4.82 4.14 - 5.80 x10E6/uL   Hemoglobin 14.4 12.6 - 17.7 g/dL   Hematocrit 19.1 47.8 - 51.0 %   MCV 88 79 - 97 fL   MCH 29.9 26.6 - 33.0 pg   MCHC 33.8 31.5 - 35.7 g/dL   RDW 29.5 62.1 - 30.8 %   Platelets 192 150 - 379 x10E3/uL   Neutrophils 61 %   Lymphs 29 %   Monocytes 7 %   Eos 3 %   Basos 0 %   Neutrophils Absolute 3.6 1.4 - 7.0 x10E3/uL   Lymphocytes Absolute 1.7 0.7 - 3.1 x10E3/uL   Monocytes Absolute 0.4 0.1 - 0.9 x10E3/uL   EOS (ABSOLUTE) 0.2 0.0 - 0.4 x10E3/uL   Basophils  Absolute 0.0 0.0 - 0.2 x10E3/uL   Immature Granulocytes 0 %   Immature Grans (Abs) 0.0 0.0 - 0.1 x10E3/uL  PSA  Result Value Ref Range   Prostate Specific Ag, Serum 0.9 0.0 - 4.0 ng/mL  Urinalysis, Routine w reflex microscopic (not at [facility])  Result Value Ref Range   Specific Gravity, UA 1.015 1.005 - 1.030   pH, UA 7.0 5.0 - 7.5   Color, UA Yellow Yellow   Appearance Ur Clear Clear   Leukocytes, UA Negative Negative   Protein, UA Negative Negative/Trace   Glucose, UA Negative Negative   Ketones, UA Negative Negative   RBC, UA Negative Negative   Bilirubin, UA Negative Negative   Urobilinogen, Ur 0.2 0.2 - 1.0 mg/dL   Nitrite, UA Negative Negative  TSH  Result Value Ref Range   TSH 1.110 0.450 - 4.500 uIU/mL      Assessment & Plan:   Problem List Items Addressed This Visit      Cardiovascular and Mediastinum   Essential hypertension - Primary    The current medical regimen is effective;  continue present plan and medications.       Relevant Orders   Basic metabolic panel     Genitourinary   BPH with obstruction/lower urinary tract symptoms    The current medical regimen is effective;  continue present plan and medications.          Discuss URI care and treatment use of over-the-counter medications Follow up plan: Return in about 6 months (around 04/21/2016) for Physical Exam.

## 2015-10-22 LAB — BASIC METABOLIC PANEL
BUN/Creatinine Ratio: 22 — ABNORMAL HIGH (ref 9–20)
BUN: 17 mg/dL (ref 6–24)
CALCIUM: 9.2 mg/dL (ref 8.7–10.2)
CO2: 24 mmol/L (ref 18–29)
Chloride: 99 mmol/L (ref 96–106)
Creatinine, Ser: 0.79 mg/dL (ref 0.76–1.27)
GFR calc Af Amer: 114 mL/min/{1.73_m2} (ref >59)
GFR calc non Af Amer: 99 mL/min/{1.73_m2} (ref >59)
GLUCOSE: 126 mg/dL — AB (ref 65–99)
Potassium: 4.4 mmol/L (ref 3.5–5.2)
Sodium: 138 mmol/L (ref 134–144)

## 2015-10-25 ENCOUNTER — Encounter: Payer: Self-pay | Admitting: Family Medicine

## 2016-03-28 ENCOUNTER — Encounter (INDEPENDENT_AMBULATORY_CARE_PROVIDER_SITE_OTHER): Payer: Self-pay

## 2016-04-22 ENCOUNTER — Other Ambulatory Visit: Payer: Self-pay | Admitting: Family Medicine

## 2016-04-22 DIAGNOSIS — I10 Essential (primary) hypertension: Secondary | ICD-10-CM

## 2016-04-27 ENCOUNTER — Encounter: Payer: Self-pay | Admitting: Family Medicine

## 2016-04-27 ENCOUNTER — Ambulatory Visit (INDEPENDENT_AMBULATORY_CARE_PROVIDER_SITE_OTHER): Payer: BLUE CROSS/BLUE SHIELD | Admitting: Family Medicine

## 2016-04-27 VITALS — BP 127/73 | HR 56 | Temp 97.8°F | Ht 69.5 in | Wt 201.0 lb

## 2016-04-27 DIAGNOSIS — N138 Other obstructive and reflux uropathy: Secondary | ICD-10-CM

## 2016-04-27 DIAGNOSIS — Z0000 Encounter for general adult medical examination without abnormal findings: Secondary | ICD-10-CM

## 2016-04-27 DIAGNOSIS — I10 Essential (primary) hypertension: Secondary | ICD-10-CM | POA: Diagnosis not present

## 2016-04-27 DIAGNOSIS — N401 Enlarged prostate with lower urinary tract symptoms: Secondary | ICD-10-CM | POA: Diagnosis not present

## 2016-04-27 LAB — URINALYSIS, ROUTINE W REFLEX MICROSCOPIC
Bilirubin, UA: NEGATIVE
GLUCOSE, UA: NEGATIVE
Ketones, UA: NEGATIVE
Leukocytes, UA: NEGATIVE
Nitrite, UA: NEGATIVE
PROTEIN UA: NEGATIVE
RBC, UA: NEGATIVE
SPEC GRAV UA: 1.01 (ref 1.005–1.030)
UUROB: 0.2 mg/dL (ref 0.2–1.0)
pH, UA: 7 (ref 5.0–7.5)

## 2016-04-27 MED ORDER — FINASTERIDE 5 MG PO TABS: 5.0000 mg | ORAL_TABLET | Freq: Every day | ORAL | 4 refills | Status: DC

## 2016-04-27 MED ORDER — AMLODIPINE BESYLATE 5 MG PO TABS: 5.0000 mg | ORAL_TABLET | Freq: Every day | ORAL | 4 refills | Status: DC

## 2016-04-27 MED ORDER — TAMSULOSIN HCL 0.4 MG PO CAPS: 0.4000 mg | ORAL_CAPSULE | Freq: Every day | ORAL | 4 refills | Status: DC

## 2016-04-27 MED ORDER — BENAZEPRIL HCL 40 MG PO TABS: 40.0000 mg | ORAL_TABLET | Freq: Every day | ORAL | 4 refills | Status: DC

## 2016-04-27 NOTE — Assessment & Plan Note (Signed)
The current medical regimen is effective;  continue present plan and medications.  

## 2016-04-27 NOTE — Progress Notes (Signed)
BP 127/73   Pulse (!) 56   Temp 97.8 F (36.6 C)   Ht 5' 9.5" (1.765 m)   Wt 201 lb (91.2 kg)   SpO2 99%   BMI 29.26 kg/m    Subjective:    Patient ID: Scott Hester, male    DOB: [birth year], 60 y.o.   MRN: [id number]  HPI: Scott Hester is a 60 y.o. male  Chief Complaint  Patient presents with  . Annual Exam    He is not sure if he wants a colonoscopy.    Discussed options for colonoscopy gave: Guarded information patient will pursue with his insurance company Blood pressure doing well no complaints from medications BPH doing well no complaints Taking medications faithfully  Relevant past medical, surgical, family and social history reviewed and updated as indicated. Interim medical history since our last visit reviewed. Allergies and medications reviewed and updated.  Review of Systems  Constitutional: Negative.   HENT: Negative.   Eyes: Negative.   Respiratory: Negative.   Cardiovascular: Negative.   Gastrointestinal: Negative.   Endocrine: Negative.   Genitourinary: Negative.   Musculoskeletal: Negative.   Skin: Negative.   Allergic/Immunologic: Negative.   Neurological: Negative.   Hematological: Negative.   Psychiatric/Behavioral: Negative.     Per HPI unless specifically indicated above     Objective:    BP 127/73   Pulse (!) 56   Temp 97.8 F (36.6 C)   Ht 5' 9.5" (1.765 m)   Wt 201 lb (91.2 kg)   SpO2 99%   BMI 29.26 kg/m   Wt Readings from Last 3 Encounters:  04/27/16 201 lb (91.2 kg)  10/21/15 198 lb (89.8 kg)  04/15/15 190 lb (86.2 kg)    Physical Exam  Constitutional: He is oriented to person, place, and time. He appears well-developed and well-nourished.  HENT:  Head: Normocephalic and atraumatic.  Right Ear: External ear normal.  Left Ear: External ear normal.  Eyes: Conjunctivae and EOM are normal. Pupils are equal, round, and reactive to light.  Neck: Normal range of motion. Neck supple.  Cardiovascular: Normal rate,  regular rhythm, normal heart sounds and intact distal pulses.   Pulmonary/Chest: Effort normal and breath sounds normal.  Abdominal: Soft. Bowel sounds are normal. There is no splenomegaly or hepatomegaly.  Genitourinary: Rectum normal, prostate normal and penis normal.  Musculoskeletal: Normal range of motion.  Neurological: He is alert and oriented to person, place, and time. He has normal reflexes.  Skin: No rash noted. No erythema.  Psychiatric: He has a normal mood and affect. His behavior is normal. Judgment and thought content normal.    Results for orders placed or performed in visit on 10/21/15  Basic metabolic panel  Result Value Ref Range   Glucose 126 (H) 65 - 99 mg/dL   BUN 17 6 - 24 mg/dL   Creatinine, Ser 4.09 0.76 - 1.27 mg/dL   GFR calc non Af Amer 99 >59 mL/min/1.73   GFR calc Af Amer 114 >59 mL/min/1.73   BUN/Creatinine Ratio 22 (H) 9 - 20   Sodium 138 134 - 144 mmol/L   Potassium 4.4 3.5 - 5.2 mmol/L   Chloride 99 96 - 106 mmol/L   CO2 24 18 - 29 mmol/L   Calcium 9.2 8.7 - 10.2 mg/dL      Assessment & Plan:   Problem List Items Addressed This Visit      Cardiovascular and Mediastinum   Essential hypertension    The current medical  regimen is effective;  continue present plan and medications.       Relevant Medications   benazepril (LOTENSIN) 40 MG tablet   amLODipine (NORVASC) 5 MG tablet     Genitourinary   BPH with obstruction/lower urinary tract symptoms    The current medical regimen is effective;  continue present plan and medications.       Relevant Medications   tamsulosin (FLOMAX) 0.4 MG CAPS capsule   finasteride (PROSCAR) 5 MG tablet    Other Visit Diagnoses    PE (physical exam), annual    -  Primary   Relevant Orders   Comprehensive metabolic panel   Lipid panel   CBC with Differential/Platelet   TSH   Urinalysis, Routine w reflex microscopic (not at [facility])   PSA       Follow up plan: Return in about 6 months (around  10/26/2016) for BMP.

## 2016-04-28 LAB — CBC WITH DIFFERENTIAL/PLATELET
Basophils Absolute: 0 10*3/uL (ref 0.0–0.2)
Basos: 0 %
EOS (ABSOLUTE): 0.2 10*3/uL (ref 0.0–0.4)
Eos: 3 %
HEMOGLOBIN: 14.5 g/dL (ref 12.6–17.7)
Hematocrit: 42.9 % (ref 37.5–51.0)
IMMATURE GRANULOCYTES: 0 %
Immature Grans (Abs): 0 10*3/uL (ref 0.0–0.1)
Lymphocytes Absolute: 1.9 10*3/uL (ref 0.7–3.1)
Lymphs: 32 %
MCH: 30.3 pg (ref 26.6–33.0)
MCHC: 33.8 g/dL (ref 31.5–35.7)
MCV: 90 fL (ref 79–97)
MONOCYTES: 7 %
Monocytes Absolute: 0.4 10*3/uL (ref 0.1–0.9)
NEUTROS PCT: 58 %
Neutrophils Absolute: 3.5 10*3/uL (ref 1.4–7.0)
Platelets: 195 10*3/uL (ref 150–379)
RBC: 4.79 x10E6/uL (ref 4.14–5.80)
RDW: 12.7 % (ref 12.3–15.4)
WBC: 6 10*3/uL (ref 3.4–10.8)

## 2016-04-28 LAB — COMPREHENSIVE METABOLIC PANEL
ALK PHOS: 53 IU/L (ref 39–117)
ALT: 18 IU/L (ref 0–44)
AST: 17 IU/L (ref 0–40)
Albumin/Globulin Ratio: 1.6 (ref 1.2–2.2)
Albumin: 4.4 g/dL (ref 3.5–5.5)
BUN/Creatinine Ratio: 16 (ref 9–20)
BUN: 10 mg/dL (ref 6–24)
Bilirubin Total: 0.7 mg/dL (ref 0.0–1.2)
CO2: 30 mmol/L — AB (ref 18–29)
Calcium: 9.5 mg/dL (ref 8.7–10.2)
Chloride: 99 mmol/L (ref 96–106)
Creatinine, Ser: 0.63 mg/dL — ABNORMAL LOW (ref 0.76–1.27)
GFR calc Af Amer: 125 mL/min/{1.73_m2} (ref >59)
GFR calc non Af Amer: 108 mL/min/{1.73_m2} (ref >59)
GLOBULIN, TOTAL: 2.7 g/dL (ref 1.5–4.5)
GLUCOSE: 138 mg/dL — AB (ref 65–99)
Potassium: 4.4 mmol/L (ref 3.5–5.2)
Sodium: 141 mmol/L (ref 134–144)
Total Protein: 7.1 g/dL (ref 6.0–8.5)

## 2016-04-28 LAB — PSA: PROSTATE SPECIFIC AG, SERUM: 1.1 ng/mL (ref 0.0–4.0)

## 2016-04-28 LAB — LIPID PANEL
CHOL/HDL RATIO: 5 ratio (ref 0.0–5.0)
CHOLESTEROL TOTAL: 199 mg/dL (ref 100–199)
HDL: 40 mg/dL (ref >39)
LDL Calculated: 140 mg/dL — ABNORMAL HIGH (ref 0–99)
TRIGLYCERIDES: 96 mg/dL (ref 0–149)
VLDL CHOLESTEROL CAL: 19 mg/dL (ref 5–40)

## 2016-04-28 LAB — TSH: TSH: 1.34 u[IU]/mL (ref 0.450–4.500)

## 2016-08-30 ENCOUNTER — Ambulatory Visit (INDEPENDENT_AMBULATORY_CARE_PROVIDER_SITE_OTHER): Payer: BLUE CROSS/BLUE SHIELD | Admitting: Family Medicine

## 2016-08-30 ENCOUNTER — Encounter: Payer: Self-pay | Admitting: Family Medicine

## 2016-08-30 VITALS — BP 117/64 | HR 78 | Temp 100.6°F | Resp 17 | Ht 69.5 in | Wt 210.0 lb

## 2016-08-30 DIAGNOSIS — J101 Influenza due to other identified influenza virus with other respiratory manifestations: Secondary | ICD-10-CM | POA: Diagnosis not present

## 2016-08-30 LAB — VERITOR FLU A/B WAIVED
INFLUENZA A: POSITIVE — AB
Influenza B: NEGATIVE

## 2016-08-30 MED ORDER — ALBUTEROL SULFATE HFA 108 (90 BASE) MCG/ACT IN AERS: 2.0000 | INHALATION_SPRAY | Freq: Four times a day (QID) | RESPIRATORY_TRACT | 0 refills | Status: DC | PRN

## 2016-08-30 MED ORDER — OSELTAMIVIR PHOSPHATE 75 MG PO CAPS: 75.0000 mg | ORAL_CAPSULE | Freq: Two times a day (BID) | ORAL | 0 refills | Status: DC

## 2016-08-30 NOTE — Patient Instructions (Signed)
Follow up as needed

## 2016-08-30 NOTE — Progress Notes (Signed)
BP 117/64 (BP Location: Left Arm, Patient Position: Sitting, Cuff Size: Normal)   Pulse 78   Temp (!) 100.6 F (38.1 C) (Oral)   Resp 17   Ht 5' 9.5" (1.765 m)   Wt 210 lb (95.3 kg)   SpO2 99%   BMI 30.57 kg/m    Subjective:    Patient ID: Scott Hester, male    DOB: October 20, 1955, 61 y.o.   MRN: [id number]  HPI: Scott Hester is a 61 y.o. male  Chief Complaint  Patient presents with  . Generalized Body Aches    onset yesterday  . Fatigue   Body aches, sweats, chills, sore throat, cough, congestion x 1 day. Denies CP, SOB, N/V/D, ear pain. Taking tylenol and mucinex cough syrup with minimal relief. No sick contacts.   Past Medical History:  Diagnosis Date  . BPH (benign prostatic hyperplasia)   . Hypertension    Social History   Social History  . Marital status: Single    Spouse name: N/A  . Number of children: N/A  . Years of education: N/A   Occupational History  . Not on file.   Social History Main Topics  . Smoking status: Never Smoker  . Smokeless tobacco: Never Used  . Alcohol use No  . Drug use: No  . Sexual activity: Yes   Other Topics Concern  . Not on file   Social History Narrative  . No narrative on file    Relevant past medical, surgical, family and social history reviewed and updated as indicated. Interim medical history since our last visit reviewed. Allergies and medications reviewed and updated.  Review of Systems  Constitutional: Positive for chills, diaphoresis, fatigue and fever.  HENT: Positive for congestion and sore throat.   Respiratory: Positive for cough.   Cardiovascular: Negative.   Gastrointestinal: Negative.   Genitourinary: Negative.   Musculoskeletal: Positive for myalgias.  Neurological: Negative.   Psychiatric/Behavioral: Negative.     Per HPI unless specifically indicated above     Objective:    BP 117/64 (BP Location: Left Arm, Patient Position: Sitting, Cuff Size: Normal)   Pulse 78   Temp (!) 100.6 F  (38.1 C) (Oral)   Resp 17   Ht 5' 9.5" (1.765 m)   Wt 210 lb (95.3 kg)   SpO2 99%   BMI 30.57 kg/m   Wt Readings from Last 3 Encounters:  08/30/16 210 lb (95.3 kg)  04/27/16 201 lb (91.2 kg)  10/21/15 198 lb (89.8 kg)    Physical Exam  Constitutional: He is oriented to person, place, and time. He appears well-developed and well-nourished. No distress.  HENT:  Head: Atraumatic.  Right Ear: External ear normal.  Left Ear: External ear normal.  Mouth/Throat: Oropharynx is clear and moist.  Eyes: Conjunctivae are normal. Pupils are equal, round, and reactive to light. No scleral icterus.  Neck: Normal range of motion. Neck supple.  Cardiovascular: Normal rate and normal heart sounds.   Pulmonary/Chest: Effort normal. No respiratory distress. He has wheezes (mild wheezes).  Musculoskeletal: Normal range of motion.  Lymphadenopathy:    He has no cervical adenopathy.  Neurological: He is alert and oriented to person, place, and time.  Skin: Skin is warm and dry.  Psychiatric: He has a normal mood and affect. His behavior is normal.  Nursing note and vitals reviewed.     Assessment & Plan:   Problem List Items Addressed This Visit    None    Visit Diagnoses  Influenza A    -  Primary   Tamiflu sent, discussed that albuterol is being sent and only needs to take it if he feels chest tightness or wheezing.    Relevant Medications   oseltamivir (TAMIFLU) 75 MG capsule   Other Relevant Orders   Influenza A & B (STAT) (Completed)       Follow up plan: Return for as scheduled.

## 2016-10-26 ENCOUNTER — Ambulatory Visit: Payer: BLUE CROSS/BLUE SHIELD | Admitting: Family Medicine

## 2016-10-31 ENCOUNTER — Ambulatory Visit (INDEPENDENT_AMBULATORY_CARE_PROVIDER_SITE_OTHER): Payer: BLUE CROSS/BLUE SHIELD | Admitting: Family Medicine

## 2016-10-31 ENCOUNTER — Encounter: Payer: Self-pay | Admitting: Family Medicine

## 2016-10-31 VITALS — BP 125/72 | HR 59 | Ht 71.0 in | Wt 210.0 lb

## 2016-10-31 DIAGNOSIS — N401 Enlarged prostate with lower urinary tract symptoms: Secondary | ICD-10-CM

## 2016-10-31 DIAGNOSIS — I10 Essential (primary) hypertension: Secondary | ICD-10-CM

## 2016-10-31 DIAGNOSIS — N138 Other obstructive and reflux uropathy: Secondary | ICD-10-CM

## 2016-10-31 NOTE — Assessment & Plan Note (Signed)
The current medical regimen is effective;  continue present plan and medications.  

## 2016-10-31 NOTE — Progress Notes (Signed)
   BP 125/72   Pulse (!) 59   Ht  (1.803 m)   Wt 210 lb (95.3 kg)   SpO2 99%   BMI 29.29 kg/m    Subjective:    Patient ID: Scott Hester, male    DOB: 01-Aug-1955, 61 y.o.   MRN: [id number]  HPI: Scott Hester is a 61 y.o. male  Chief Complaint  Patient presents with  . Follow-up   Patient follow-up blood pressure BPH doing well with no complaints taking medications faithfully with no side effects BPH symptoms are stable [place] doing okay. Relevant past medical, surgical, family and social history reviewed and updated as indicated. Interim medical history since our last visit reviewed. Allergies and medications reviewed and updated.  Review of Systems  Constitutional: Negative.   Respiratory: Negative.   Cardiovascular: Negative.     Per HPI unless specifically indicated above     Objective:    BP 125/72   Pulse (!) 59   Ht  (1.803 m)   Wt 210 lb (95.3 kg)   SpO2 99%   BMI 29.29 kg/m   Wt Readings from Last 3 Encounters:  10/31/16 210 lb (95.3 kg)  08/30/16 210 lb (95.3 kg)  04/27/16 201 lb (91.2 kg)    Physical Exam  Constitutional: He is oriented to person, place, and time. He appears well-developed and well-nourished.  HENT:  Head: Normocephalic and atraumatic.  Eyes: Conjunctivae and EOM are normal.  Neck: Normal range of motion.  Cardiovascular: Normal rate, regular rhythm and normal heart sounds.   Pulmonary/Chest: Effort normal and breath sounds normal.  Musculoskeletal: Normal range of motion.  Neurological: He is alert and oriented to person, place, and time.  Skin: No erythema.  Psychiatric: He has a normal mood and affect. His behavior is normal. Judgment and thought content normal.    Results for orders placed or performed in visit on 08/30/16  Influenza A & B (STAT)  Result Value Ref Range   Influenza A Positive (A) Negative   Influenza B Negative Negative      Assessment & Plan:   Problem List Items Addressed This  Visit      Cardiovascular and Mediastinum   Essential hypertension - Primary    The current medical regimen is effective;  continue present plan and medications.       Relevant Orders   Basic metabolic panel     Genitourinary   BPH with obstruction/lower urinary tract symptoms    The current medical regimen is effective;  continue present plan and medications.           Follow up plan: Return in about 6 months (around 05/02/2017) for Physical Exam.

## 2016-11-01 ENCOUNTER — Encounter: Payer: Self-pay | Admitting: Family Medicine

## 2016-11-01 LAB — BASIC METABOLIC PANEL
BUN/Creatinine Ratio: 12 (ref 9–20)
BUN: 9 mg/dL (ref 6–24)
CO2: 27 mmol/L (ref 18–29)
CREATININE: 0.77 mg/dL (ref 0.76–1.27)
Calcium: 9.8 mg/dL (ref 8.7–10.2)
Chloride: 100 mmol/L (ref 96–106)
GFR calc Af Amer: 115 mL/min/{1.73_m2} (ref >59)
GFR, EST NON AFRICAN AMERICAN: 99 mL/min/{1.73_m2} (ref >59)
Glucose: 136 mg/dL — ABNORMAL HIGH (ref 65–99)
Potassium: 4.9 mmol/L (ref 3.5–5.2)
Sodium: 139 mmol/L (ref 134–144)

## 2017-05-02 ENCOUNTER — Ambulatory Visit (INDEPENDENT_AMBULATORY_CARE_PROVIDER_SITE_OTHER): Payer: BLUE CROSS/BLUE SHIELD | Admitting: Family Medicine

## 2017-05-02 ENCOUNTER — Encounter: Payer: Self-pay | Admitting: Family Medicine

## 2017-05-02 VITALS — BP 137/74 | HR 66 | Wt 205.0 lb

## 2017-05-02 DIAGNOSIS — N138 Other obstructive and reflux uropathy: Secondary | ICD-10-CM

## 2017-05-02 DIAGNOSIS — N401 Enlarged prostate with lower urinary tract symptoms: Secondary | ICD-10-CM | POA: Diagnosis not present

## 2017-05-02 DIAGNOSIS — I10 Essential (primary) hypertension: Secondary | ICD-10-CM

## 2017-05-02 DIAGNOSIS — Z23 Encounter for immunization: Secondary | ICD-10-CM | POA: Diagnosis not present

## 2017-05-02 DIAGNOSIS — Z0000 Encounter for general adult medical examination without abnormal findings: Secondary | ICD-10-CM

## 2017-05-02 LAB — URINALYSIS, ROUTINE W REFLEX MICROSCOPIC
Bilirubin, UA: NEGATIVE
Glucose, UA: NEGATIVE
Ketones, UA: NEGATIVE
Leukocytes, UA: NEGATIVE
NITRITE UA: NEGATIVE
PH UA: 7 (ref 5.0–7.5)
Protein, UA: NEGATIVE
RBC, UA: NEGATIVE
Specific Gravity, UA: 1.01 (ref 1.005–1.030)
UUROB: 0.2 mg/dL (ref 0.2–1.0)

## 2017-05-02 LAB — MICROSCOPIC EXAMINATION: BACTERIA UA: NONE SEEN

## 2017-05-02 MED ORDER — BENAZEPRIL HCL 40 MG PO TABS: 40.0000 mg | ORAL_TABLET | Freq: Every day | ORAL | 4 refills | Status: DC

## 2017-05-02 MED ORDER — TAMSULOSIN HCL 0.4 MG PO CAPS: 0.4000 mg | ORAL_CAPSULE | Freq: Every day | ORAL | 4 refills | Status: DC

## 2017-05-02 MED ORDER — AMLODIPINE BESYLATE 5 MG PO TABS: 5.0000 mg | ORAL_TABLET | Freq: Every day | ORAL | 4 refills | Status: DC

## 2017-05-02 MED ORDER — FINASTERIDE 5 MG PO TABS: 5.0000 mg | ORAL_TABLET | Freq: Every day | ORAL | 4 refills | Status: DC

## 2017-05-02 NOTE — Assessment & Plan Note (Signed)
The current medical regimen is effective;  continue present plan and medications.  

## 2017-05-02 NOTE — Patient Instructions (Signed)

## 2017-05-02 NOTE — Progress Notes (Signed)
BP 137/74   Pulse 66   Wt 205 lb (93 kg)   SpO2 99%   BMI 28.59 kg/m    Subjective:    Patient ID: Scott Hester, male    DOB: 1955/10/20, 61 y.o.   MRN: [id number]  HPI: Scott Hester is a 61 y.o. male  Annual exam  Patient all in all doing well recheck hypertension no complaints. Taking medications faithfully without problems. BPH doing well taking medications without problems and good flow of water without issues retention blood in stool or urine. Patient also with fire ant bites on both legs reviewed fire ant control and treatment  Relevant past medical, surgical, family and social history reviewed and updated as indicated. Interim medical history since our last visit reviewed. Allergies and medications reviewed and updated.  Review of Systems  Constitutional: Negative.   HENT: Negative.   Eyes: Negative.   Respiratory: Negative.   Cardiovascular: Negative.   Gastrointestinal: Negative.   Endocrine: Negative.   Genitourinary: Negative.   Musculoskeletal: Negative.   Skin: Negative.   Allergic/Immunologic: Negative.   Neurological: Negative.   Hematological: Negative.   Psychiatric/Behavioral: Negative.     Per HPI unless specifically indicated above     Objective:    BP 137/74   Pulse 66   Wt 205 lb (93 kg)   SpO2 99%   BMI 28.59 kg/m   Wt Readings from Last 3 Encounters:  05/02/17 205 lb (93 kg)  10/31/16 210 lb (95.3 kg)  08/30/16 210 lb (95.3 kg)    Physical Exam  Constitutional: He is oriented to person, place, and time. He appears well-developed and well-nourished.  HENT:  Head: Normocephalic and atraumatic.  Right Ear: External ear normal.  Left Ear: External ear normal.  Eyes: Pupils are equal, round, and reactive to light. Conjunctivae and EOM are normal.  Neck: Normal range of motion. Neck supple.  Cardiovascular: Normal rate, regular rhythm, normal heart sounds and intact distal pulses.   Pulmonary/Chest: Effort normal and breath  sounds normal.  Abdominal: Soft. Bowel sounds are normal. There is no splenomegaly or hepatomegaly.  Genitourinary: Rectum normal and penis normal.  Genitourinary Comments: BPH changes  Musculoskeletal: Normal range of motion.  Neurological: He is alert and oriented to person, place, and time. He has normal reflexes.  Skin: No rash noted. No erythema.  Psychiatric: He has a normal mood and affect. His behavior is normal. Judgment and thought content normal.    Results for orders placed or performed in visit on 10/31/16  Basic metabolic panel  Result Value Ref Range   Glucose 136 (H) 65 - 99 mg/dL   BUN 9 6 - 24 mg/dL   Creatinine, Ser 7.82 0.76 - 1.27 mg/dL   GFR calc non Af Amer 99 >59 mL/min/1.73   GFR calc Af Amer 115 >59 mL/min/1.73   BUN/Creatinine Ratio 12 9 - 20   Sodium 139 134 - 144 mmol/L   Potassium 4.9 3.5 - 5.2 mmol/L   Chloride 100 96 - 106 mmol/L   CO2 27 18 - 29 mmol/L   Calcium 9.8 8.7 - 10.2 mg/dL      Assessment & Plan:   Problem List Items Addressed This Visit      Cardiovascular and Mediastinum   Essential hypertension - Primary    The current medical regimen is effective;  continue present plan and medications.       Relevant Medications   amLODipine (NORVASC) 5 MG tablet   benazepril (LOTENSIN)  40 MG tablet   Other Relevant Orders   CBC with Differential/Platelet     Genitourinary   BPH with obstruction/lower urinary tract symptoms    The current medical regimen is effective;  continue present plan and medications.       Relevant Medications   finasteride (PROSCAR) 5 MG tablet   tamsulosin (FLOMAX) 0.4 MG CAPS capsule   Other Relevant Orders   PSA    Other Visit Diagnoses    Needs flu shot       Relevant Orders   Flu Vaccine QUAD 6+ mos PF IM (Fluarix Quad PF)   PE (physical exam), annual       Relevant Orders   CBC with Differential/Platelet   Comprehensive metabolic panel   Lipid panel   PSA   TSH   Urinalysis, Routine w reflex  microscopic       Follow up plan: Return in about 6 months (around 10/31/2017) for BMP.

## 2017-05-03 LAB — COMPREHENSIVE METABOLIC PANEL
A/G RATIO: 1.6 (ref 1.2–2.2)
ALBUMIN: 4.4 g/dL (ref 3.6–4.8)
ALT: 21 IU/L (ref 0–44)
AST: 13 IU/L (ref 0–40)
Alkaline Phosphatase: 53 IU/L (ref 39–117)
BILIRUBIN TOTAL: 0.8 mg/dL (ref 0.0–1.2)
BUN/Creatinine Ratio: 14 (ref 10–24)
BUN: 12 mg/dL (ref 8–27)
CHLORIDE: 98 mmol/L (ref 96–106)
CO2: 27 mmol/L (ref 20–29)
Calcium: 9.4 mg/dL (ref 8.6–10.2)
Creatinine, Ser: 0.83 mg/dL (ref 0.76–1.27)
GFR calc non Af Amer: 96 mL/min/{1.73_m2} (ref >59)
GFR, EST AFRICAN AMERICAN: 111 mL/min/{1.73_m2} (ref >59)
GLOBULIN, TOTAL: 2.8 g/dL (ref 1.5–4.5)
GLUCOSE: 144 mg/dL — AB (ref 65–99)
Potassium: 4.2 mmol/L (ref 3.5–5.2)
SODIUM: 140 mmol/L (ref 134–144)
TOTAL PROTEIN: 7.2 g/dL (ref 6.0–8.5)

## 2017-05-03 LAB — CBC WITH DIFFERENTIAL/PLATELET
Basophils Absolute: 0 10*3/uL (ref 0.0–0.2)
Basos: 1 %
EOS (ABSOLUTE): 0.1 10*3/uL (ref 0.0–0.4)
EOS: 2 %
HEMATOCRIT: 42.3 % (ref 37.5–51.0)
HEMOGLOBIN: 14.8 g/dL (ref 13.0–17.7)
Immature Grans (Abs): 0 10*3/uL (ref 0.0–0.1)
Immature Granulocytes: 0 %
LYMPHS ABS: 1.8 10*3/uL (ref 0.7–3.1)
Lymphs: 29 %
MCH: 30.5 pg (ref 26.6–33.0)
MCHC: 35 g/dL (ref 31.5–35.7)
MCV: 87 fL (ref 79–97)
Monocytes Absolute: 0.5 10*3/uL (ref 0.1–0.9)
Monocytes: 7 %
NEUTROS ABS: 3.8 10*3/uL (ref 1.4–7.0)
Neutrophils: 61 %
Platelets: 191 10*3/uL (ref 150–379)
RBC: 4.85 x10E6/uL (ref 4.14–5.80)
RDW: 13.3 % (ref 12.3–15.4)
WBC: 6.3 10*3/uL (ref 3.4–10.8)

## 2017-05-03 LAB — LIPID PANEL
Chol/HDL Ratio: 4.9 ratio (ref 0.0–5.0)
Cholesterol, Total: 202 mg/dL — ABNORMAL HIGH (ref 100–199)
HDL: 41 mg/dL (ref >39)
LDL Calculated: 138 mg/dL — ABNORMAL HIGH (ref 0–99)
Triglycerides: 117 mg/dL (ref 0–149)
VLDL CHOLESTEROL CAL: 23 mg/dL (ref 5–40)

## 2017-05-03 LAB — TSH: TSH: 1.24 u[IU]/mL (ref 0.450–4.500)

## 2017-05-03 LAB — PSA: Prostate Specific Ag, Serum: 0.9 ng/mL (ref 0.0–4.0)

## 2017-05-07 ENCOUNTER — Telehealth: Payer: Self-pay | Admitting: Family Medicine

## 2017-05-07 DIAGNOSIS — R7309 Other abnormal glucose: Secondary | ICD-10-CM

## 2017-05-07 NOTE — Telephone Encounter (Signed)
-----   Message from Pablo LedgerBrittany N Russell, CMA sent at 05/07/2017 11:49 AM EDT ----- Phone call with patient's wife (signed DPR).

## 2017-05-07 NOTE — Telephone Encounter (Signed)
Phone call Discussed with patient's wife elevated blood glucose. Other blood work normal will check hemoglobin A1c

## 2017-05-22 ENCOUNTER — Encounter: Payer: Self-pay | Admitting: Family Medicine

## 2017-05-22 ENCOUNTER — Telehealth: Payer: Self-pay | Admitting: Family Medicine

## 2017-05-22 DIAGNOSIS — E1129 Type 2 diabetes mellitus with other diabetic kidney complication: Secondary | ICD-10-CM | POA: Insufficient documentation

## 2017-05-22 DIAGNOSIS — E119 Type 2 diabetes mellitus without complications: Secondary | ICD-10-CM | POA: Insufficient documentation

## 2017-05-22 DIAGNOSIS — E1169 Type 2 diabetes mellitus with other specified complication: Secondary | ICD-10-CM | POA: Insufficient documentation

## 2017-05-22 NOTE — Telephone Encounter (Signed)
Trying to close note

## 2017-05-22 NOTE — Telephone Encounter (Signed)
Wrong click  ignore this note

## 2017-05-29 ENCOUNTER — Telehealth: Payer: Self-pay | Admitting: Family Medicine

## 2017-05-29 ENCOUNTER — Encounter: Payer: Self-pay | Admitting: Family Medicine

## 2017-05-29 LAB — HGB A1C W/O EAG: HEMOGLOBIN A1C: 6.5 % — AB (ref 4.8–5.6)

## 2017-05-29 LAB — SPECIMEN STATUS REPORT

## 2017-05-29 NOTE — Telephone Encounter (Signed)
Phone call Discussed wit diabetes and glucose control will check hemoglobin A1cat next office visit

## 2017-10-31 ENCOUNTER — Encounter: Payer: Self-pay | Admitting: Family Medicine

## 2017-10-31 ENCOUNTER — Ambulatory Visit (INDEPENDENT_AMBULATORY_CARE_PROVIDER_SITE_OTHER): Payer: BLUE CROSS/BLUE SHIELD | Admitting: Family Medicine

## 2017-10-31 VITALS — BP 122/72 | HR 60 | Wt 198.0 lb

## 2017-10-31 DIAGNOSIS — N138 Other obstructive and reflux uropathy: Secondary | ICD-10-CM

## 2017-10-31 DIAGNOSIS — E119 Type 2 diabetes mellitus without complications: Secondary | ICD-10-CM | POA: Diagnosis not present

## 2017-10-31 DIAGNOSIS — I10 Essential (primary) hypertension: Secondary | ICD-10-CM | POA: Diagnosis not present

## 2017-10-31 DIAGNOSIS — N401 Enlarged prostate with lower urinary tract symptoms: Secondary | ICD-10-CM | POA: Diagnosis not present

## 2017-10-31 LAB — BAYER DCA HB A1C WAIVED: HB A1C: 6 % (ref <7.0)

## 2017-10-31 NOTE — Assessment & Plan Note (Signed)
The current medical regimen is effective;  continue present plan and medications.  

## 2017-10-31 NOTE — Progress Notes (Signed)
BP 122/72   Pulse 60   Wt 198 lb (89.8 kg)   SpO2 99%   BMI 27.62 kg/m    Subjective:    Patient ID: Scott Hester, male    DOB: May 12, 1956, 62 y.o.   MRN: [id number]  HPI: Scott Hester is a 62 y.o. male  Chief Complaint  Patient presents with  . Follow-up  . Hypertension  . Toe Pain    Pinky toe L foot  Patient with corn-like on the lateral left fifth toe area was pared and debrided with resolution of symptoms smaller wart like may need cryotherapy later. Blood pressure doing well no complaints BPH doing well with no complaints. Diet controlled diabetes no complaints. Relevant past medical, surgical, family and social history reviewed and updated as indicated. Interim medical history since our last visit reviewed. Allergies and medications reviewed and updated.  Review of Systems  Constitutional: Negative.   Respiratory: Negative.   Cardiovascular: Negative.     Per HPI unless specifically indicated above     Objective:    BP 122/72   Pulse 60   Wt 198 lb (89.8 kg)   SpO2 99%   BMI 27.62 kg/m   Wt Readings from Last 3 Encounters:  10/31/17 198 lb (89.8 kg)  05/02/17 205 lb (93 kg)  10/31/16 210 lb (95.3 kg)    Physical Exam  Constitutional: He is oriented to person, place, and time. He appears well-developed and well-nourished.  HENT:  Head: Normocephalic and atraumatic.  Eyes: Conjunctivae and EOM are normal.  Neck: Normal range of motion.  Cardiovascular: Normal rate, regular rhythm and normal heart sounds.  Pulmonary/Chest: Effort normal and breath sounds normal.  Musculoskeletal: Normal range of motion.  Neurological: He is alert and oriented to person, place, and time.  Skin: No erythema.  Wart debrided left fifth toe  Psychiatric: He has a normal mood and affect. His behavior is normal. Judgment and thought content normal.    Results for orders placed or performed in visit on 05/02/17  Microscopic Examination  Result Value Ref Range   WBC,  UA 0-5 0 - 5 /hpf   RBC, UA 0-2 0 - 2 /hpf   Epithelial Cells (non renal) CANCELED    Bacteria, UA None seen None seen/Few  CBC with Differential/Platelet  Result Value Ref Range   WBC 6.3 3.4 - 10.8 x10E3/uL   RBC 4.85 4.14 - 5.80 x10E6/uL   Hemoglobin 14.8 13.0 - 17.7 g/dL   Hematocrit 62.142.3 30.837.5 - 51.0 %   MCV 87 79 - 97 fL   MCH 30.5 26.6 - 33.0 pg   MCHC 35.0 31.5 - 35.7 g/dL   RDW 65.713.3 84.612.3 - 96.215.4 %   Platelets 191 150 - 379 x10E3/uL   Neutrophils 61 Not Estab. %   Lymphs 29 Not Estab. %   Monocytes 7 Not Estab. %   Eos 2 Not Estab. %   Basos 1 Not Estab. %   Neutrophils Absolute 3.8 1.4 - 7.0 x10E3/uL   Lymphocytes Absolute 1.8 0.7 - 3.1 x10E3/uL   Monocytes Absolute 0.5 0.1 - 0.9 x10E3/uL   EOS (ABSOLUTE) 0.1 0.0 - 0.4 x10E3/uL   Basophils Absolute 0.0 0.0 - 0.2 x10E3/uL   Immature Granulocytes 0 Not Estab. %   Immature Grans (Abs) 0.0 0.0 - 0.1 x10E3/uL  Comprehensive metabolic panel  Result Value Ref Range   Glucose 144 (H) 65 - 99 mg/dL   BUN 12 8 - 27 mg/dL   Creatinine,  Ser 0.83 0.76 - 1.27 mg/dL   GFR calc non Af Amer 96 >59 mL/min/1.73   GFR calc Af Amer 111 >59 mL/min/1.73   BUN/Creatinine Ratio 14 10 - 24   Sodium 140 134 - 144 mmol/L   Potassium 4.2 3.5 - 5.2 mmol/L   Chloride 98 96 - 106 mmol/L   CO2 27 20 - 29 mmol/L   Calcium 9.4 8.6 - 10.2 mg/dL   Total Protein 7.2 6.0 - 8.5 g/dL   Albumin 4.4 3.6 - 4.8 g/dL   Globulin, Total 2.8 1.5 - 4.5 g/dL   Albumin/Globulin Ratio 1.6 1.2 - 2.2   Bilirubin Total 0.8 0.0 - 1.2 mg/dL   Alkaline Phosphatase 53 39 - 117 IU/L   AST 13 0 - 40 IU/L   ALT 21 0 - 44 IU/L  Lipid panel  Result Value Ref Range   Cholesterol, Total 202 (H) 100 - 199 mg/dL   Triglycerides 161 0 - 149 mg/dL   HDL 41 >09 mg/dL   VLDL Cholesterol Cal 23 5 - 40 mg/dL   LDL Calculated 604 (H) 0 - 99 mg/dL   Chol/HDL Ratio 4.9 0.0 - 5.0 ratio  PSA  Result Value Ref Range   Prostate Specific Ag, Serum 0.9 0.0 - 4.0 ng/mL  TSH  Result  Value Ref Range   TSH 1.240 0.450 - 4.500 uIU/mL  Urinalysis, Routine w reflex microscopic  Result Value Ref Range   Specific Gravity, UA 1.010 1.005 - 1.030   pH, UA 7.0 5.0 - 7.5   Color, UA Yellow Yellow   Appearance Ur Clear Clear   Leukocytes, UA Negative Negative   Protein, UA Negative Negative/Trace   Glucose, UA Negative Negative   Ketones, UA Negative Negative   RBC, UA Negative Negative   Bilirubin, UA Negative Negative   Urobilinogen, Ur 0.2 0.2 - 1.0 mg/dL   Nitrite, UA Negative Negative   Microscopic Examination See below:   Hgb A1c w/o eAG  Result Value Ref Range   Hgb A1c MFr Bld 6.5 (H) 4.8 - 5.6 %  Specimen status report  Result Value Ref Range   specimen status report Comment       Assessment & Plan:   Problem List Items Addressed This Visit      Cardiovascular and Mediastinum   Essential hypertension - Primary    The current medical regimen is effective;  continue present plan and medications.       Relevant Orders   Basic metabolic panel     Endocrine   DM (diabetes mellitus), type 2 (HCC)    Diet controlled and doing well with hemoglobin A1c of 6 today.      Relevant Orders   Bayer DCA Hb A1c Waived     Genitourinary   BPH with obstruction/lower urinary tract symptoms    The current medical regimen is effective;  continue present plan and medications.           Follow up plan: Return in about 6 months (around 05/02/2018) for Hemoglobin A1c, Physical Exam.

## 2017-10-31 NOTE — Assessment & Plan Note (Signed)
Diet controlled and doing well with hemoglobin A1c of 6 today.

## 2018-05-08 ENCOUNTER — Encounter: Payer: Self-pay | Admitting: Family Medicine

## 2018-05-08 ENCOUNTER — Ambulatory Visit (INDEPENDENT_AMBULATORY_CARE_PROVIDER_SITE_OTHER): Payer: BLUE CROSS/BLUE SHIELD | Admitting: Family Medicine

## 2018-05-08 VITALS — BP 117/67 | HR 65 | Temp 98.1°F | Ht 69.75 in | Wt 197.8 lb

## 2018-05-08 DIAGNOSIS — N401 Enlarged prostate with lower urinary tract symptoms: Secondary | ICD-10-CM | POA: Diagnosis not present

## 2018-05-08 DIAGNOSIS — Z0000 Encounter for general adult medical examination without abnormal findings: Secondary | ICD-10-CM | POA: Diagnosis not present

## 2018-05-08 DIAGNOSIS — E119 Type 2 diabetes mellitus without complications: Secondary | ICD-10-CM | POA: Diagnosis not present

## 2018-05-08 DIAGNOSIS — I10 Essential (primary) hypertension: Secondary | ICD-10-CM

## 2018-05-08 DIAGNOSIS — Z23 Encounter for immunization: Secondary | ICD-10-CM

## 2018-05-08 DIAGNOSIS — N138 Other obstructive and reflux uropathy: Secondary | ICD-10-CM | POA: Diagnosis not present

## 2018-05-08 LAB — URINALYSIS, ROUTINE W REFLEX MICROSCOPIC
BILIRUBIN UA: NEGATIVE
GLUCOSE, UA: NEGATIVE
KETONES UA: NEGATIVE
Leukocytes, UA: NEGATIVE
Nitrite, UA: NEGATIVE
PROTEIN UA: NEGATIVE
RBC UA: NEGATIVE
SPEC GRAV UA: 1.01 (ref 1.005–1.030)
UUROB: 0.2 mg/dL (ref 0.2–1.0)
pH, UA: 7 (ref 5.0–7.5)

## 2018-05-08 LAB — BAYER DCA HB A1C WAIVED: HB A1C: 6.1 % (ref <7.0)

## 2018-05-08 MED ORDER — TAMSULOSIN HCL 0.4 MG PO CAPS: 0.4000 mg | ORAL_CAPSULE | Freq: Every day | ORAL | 4 refills | Status: DC

## 2018-05-08 MED ORDER — AMLODIPINE BESYLATE 5 MG PO TABS: 5.0000 mg | ORAL_TABLET | Freq: Every day | ORAL | 4 refills | Status: DC

## 2018-05-08 MED ORDER — BENAZEPRIL HCL 40 MG PO TABS: 40.0000 mg | ORAL_TABLET | Freq: Every day | ORAL | 4 refills | Status: DC

## 2018-05-08 MED ORDER — FINASTERIDE 5 MG PO TABS: 5.0000 mg | ORAL_TABLET | Freq: Every day | ORAL | 4 refills | Status: DC

## 2018-05-08 NOTE — Assessment & Plan Note (Signed)
The current medical regimen is effective;  continue present plan and medications.  

## 2018-05-08 NOTE — Assessment & Plan Note (Signed)
Diet controled 

## 2018-05-08 NOTE — Progress Notes (Signed)
BP 117/67 (BP Location: Left Arm, Patient Position: Sitting, Cuff Size: Normal)   Pulse 65   Temp 98.1 F (36.7 C) (Oral)   Ht 5' 9.75" (1.772 m)   Wt 197 lb 12.8 oz (89.7 kg)   SpO2 98%   BMI 28.59 kg/m    Subjective:    Patient ID: Scott Hester, male    DOB: [birth year], 62 y.o.   MRN: [id number]  HPI: Scott Hester is a 62 y.o. male  Chief Complaint  Patient presents with  . Annual Exam  Patient with right knee medially sore tender intermittently is been ongoing for a month especially bothersome when he does full flexion with weight.  No clicking locking no giving way.  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  Blood pressures been doing well no issues with medications. BPH is stable on medications. Patient's diabetes diet controlled has not been eating is much candy. Relevant past medical, surgical, family and social history reviewed and updated as indicated. Interim medical history since our last visit reviewed. Allergies and medications reviewed and updated.  Review of Systems  Constitutional: Negative.   HENT: Negative.   Eyes: Negative.   Respiratory: Negative.   Cardiovascular: Negative.   Gastrointestinal: Negative.   Endocrine: Negative.   Genitourinary: Negative.   Musculoskeletal: Negative.   Skin: Negative.   Allergic/Immunologic: Negative.   Neurological: Negative.   Hematological: Negative.   Psychiatric/Behavioral: Negative.     Per HPI unless specifically indicated above     Objective:    BP 117/67 (BP Location: Left Arm, Patient Position: Sitting, Cuff Size: Normal)   Pulse 65   Temp 98.1 F (36.7 C) (Oral)   Ht 5' 9.75" (1.772 m)   Wt 197 lb 12.8 oz (89.7 kg)   SpO2 98%   BMI 28.59 kg/m   Wt Readings from Last 3 Encounters:  05/08/18 197 lb 12.8 oz (89.7 kg)  10/31/17 198 lb (89.8 kg)  05/02/17 205 lb (93 kg)    Physical Exam  Constitutional: He is oriented to person, place, and time. He  appears well-developed and well-nourished.  HENT:  Head: Normocephalic and atraumatic.  Right Ear: External ear normal.  Left Ear: External ear normal.  Eyes: Pupils are equal, round, and reactive to light. Conjunctivae and EOM are normal.  Neck: Normal range of motion. Neck supple.  Cardiovascular: Normal rate, regular rhythm, normal heart sounds and intact distal pulses.  Pulmonary/Chest: Effort normal and breath sounds normal.  Abdominal: Soft. Bowel sounds are normal. There is no splenomegaly or hepatomegaly.  Genitourinary: Rectum normal and penis normal.  Genitourinary Comments: BPH changes  Musculoskeletal: Normal range of motion.  Neurological: He is alert and oriented to person, place, and time. He has normal reflexes.  Skin: No rash noted. No erythema.  Psychiatric: He has a normal mood and affect. His behavior is normal. Judgment and thought content normal.    Results for orders placed or performed in visit on 10/31/17  Bayer DCA Hb A1c Waived  Result Value Ref Range   HB A1C (BAYER DCA - WAIVED) 6.0 <7.0 %      Assessment & Plan:   Problem List Items Addressed This Visit      Cardiovascular and Mediastinum   Essential hypertension    The current medical regimen is effective;  continue present plan and medications.       Relevant Medications   benazepril (LOTENSIN) 40 MG tablet  amLODipine (NORVASC) 5 MG tablet   Other Relevant Orders   Comprehensive metabolic panel   Lipid panel   CBC with Differential/Platelet   TSH   Urinalysis, Routine w reflex microscopic     Endocrine   DM (diabetes mellitus), type 2 (HCC)    Diet controled      Relevant Medications   benazepril (LOTENSIN) 40 MG tablet   Other Relevant Orders   Bayer DCA Hb A1c Waived   Lipid panel   CBC with Differential/Platelet   TSH   Urinalysis, Routine w reflex microscopic     Genitourinary   BPH with obstruction/lower urinary tract symptoms    The current medical regimen is  effective;  continue present plan and medications.       Relevant Medications   tamsulosin (FLOMAX) 0.4 MG CAPS capsule   finasteride (PROSCAR) 5 MG tablet   Other Relevant Orders   Comprehensive metabolic panel   Lipid panel   CBC with Differential/Platelet   TSH   Urinalysis, Routine w reflex microscopic   PSA    Other Visit Diagnoses    Need for influenza vaccination    -  Primary   Relevant Orders   Flu Vaccine QUAD 6+ mos PF IM (Fluarix Quad PF)       Follow up plan: Return in about 6 months (around 11/07/2018) for Hemoglobin A1c, BMP.

## 2018-05-09 ENCOUNTER — Encounter: Payer: Self-pay | Admitting: Family Medicine

## 2018-05-09 LAB — COMPREHENSIVE METABOLIC PANEL
ALK PHOS: 49 IU/L (ref 39–117)
ALT: 24 IU/L (ref 0–44)
AST: 17 IU/L (ref 0–40)
Albumin/Globulin Ratio: 1.9 (ref 1.2–2.2)
Albumin: 4.5 g/dL (ref 3.6–4.8)
BUN/Creatinine Ratio: 17 (ref 10–24)
BUN: 13 mg/dL (ref 8–27)
Bilirubin Total: 0.6 mg/dL (ref 0.0–1.2)
CO2: 27 mmol/L (ref 20–29)
CREATININE: 0.77 mg/dL (ref 0.76–1.27)
Calcium: 9.3 mg/dL (ref 8.6–10.2)
Chloride: 100 mmol/L (ref 96–106)
GFR calc Af Amer: 113 mL/min/{1.73_m2} (ref >59)
GFR calc non Af Amer: 98 mL/min/{1.73_m2} (ref >59)
GLUCOSE: 145 mg/dL — AB (ref 65–99)
Globulin, Total: 2.4 g/dL (ref 1.5–4.5)
Potassium: 4.5 mmol/L (ref 3.5–5.2)
SODIUM: 139 mmol/L (ref 134–144)
Total Protein: 6.9 g/dL (ref 6.0–8.5)

## 2018-05-09 LAB — CBC WITH DIFFERENTIAL/PLATELET
BASOS: 1 %
Basophils Absolute: 0 10*3/uL (ref 0.0–0.2)
EOS (ABSOLUTE): 0.1 10*3/uL (ref 0.0–0.4)
Eos: 2 %
HEMOGLOBIN: 14.4 g/dL (ref 13.0–17.7)
Hematocrit: 43 % (ref 37.5–51.0)
IMMATURE GRANS (ABS): 0 10*3/uL (ref 0.0–0.1)
Immature Granulocytes: 0 %
LYMPHS ABS: 1.6 10*3/uL (ref 0.7–3.1)
LYMPHS: 27 %
MCH: 30.1 pg (ref 26.6–33.0)
MCHC: 33.5 g/dL (ref 31.5–35.7)
MCV: 90 fL (ref 79–97)
MONOCYTES: 6 %
Monocytes Absolute: 0.4 10*3/uL (ref 0.1–0.9)
NEUTROS ABS: 3.7 10*3/uL (ref 1.4–7.0)
Neutrophils: 64 %
Platelets: 195 10*3/uL (ref 150–450)
RBC: 4.79 x10E6/uL (ref 4.14–5.80)
RDW: 12.4 % (ref 12.3–15.4)
WBC: 5.8 10*3/uL (ref 3.4–10.8)

## 2018-05-09 LAB — LIPID PANEL
CHOLESTEROL TOTAL: 183 mg/dL (ref 100–199)
Chol/HDL Ratio: 4.7 ratio (ref 0.0–5.0)
HDL: 39 mg/dL — AB (ref >39)
LDL CALC: 125 mg/dL — AB (ref 0–99)
Triglycerides: 95 mg/dL (ref 0–149)
VLDL Cholesterol Cal: 19 mg/dL (ref 5–40)

## 2018-05-09 LAB — TSH: TSH: 1.24 u[IU]/mL (ref 0.450–4.500)

## 2018-05-09 LAB — PSA: Prostate Specific Ag, Serum: 0.8 ng/mL (ref 0.0–4.0)

## 2018-11-06 ENCOUNTER — Ambulatory Visit (INDEPENDENT_AMBULATORY_CARE_PROVIDER_SITE_OTHER): Payer: BLUE CROSS/BLUE SHIELD | Admitting: Family Medicine

## 2018-11-06 ENCOUNTER — Encounter: Payer: Self-pay | Admitting: Family Medicine

## 2018-11-06 ENCOUNTER — Other Ambulatory Visit: Payer: Self-pay

## 2018-11-06 DIAGNOSIS — J069 Acute upper respiratory infection, unspecified: Secondary | ICD-10-CM

## 2018-11-06 NOTE — Progress Notes (Signed)
There were no vitals taken for this visit.   Subjective:    Patient ID: Scott Hester, male    DOB: 10/26/1955, 63 y.o.   MRN: [id number]  HPI: Scott Hester is a 63 y.o. male  Chills   Telemedicine using audio/video telecommunications for a synchronous communication visit. Today's visit due to COVID-19 isolation precautions I connected with and verified that I am speaking with the correct person using two identifiers.   I discussed the limitations, risks, security and privacy concerns of performing an evaluation and management service by telecommunication and the availability of in person appointments. I also discussed with the patient that there may be a patient responsible charge related to this service. The patient expressed understanding and agreed to proceed. The patient's location is home I am at home.  Discussed with patient generalized fever chills achiness started yesterday.  Patient's been and in but possibly exposed to COVID-19.  Has tried some Tylenol with minimal relief.  Has dry cough.  Has had similar symptoms in the past and was diagnosed with sinus infection.  No known tick exposure. Reviewed medications and no complaints. Blood pressure stable BPH symptoms stable.  Relevant past medical, surgical, family and social history reviewed and updated as indicated. Interim medical history since our last visit reviewed. Allergies and medications reviewed and updated.  Review of Systems  Constitutional: Positive for chills, diaphoresis, fatigue and fever.  HENT: Negative.   Respiratory: Positive for cough.   Cardiovascular: Negative.     Per HPI unless specifically indicated above     Objective:    There were no vitals taken for this visit.  Wt Readings from Last 3 Encounters:  05/08/18 197 lb 12.8 oz (89.7 kg)  10/31/17 198 lb (89.8 kg)  05/02/17 205 lb (93 kg)    Physical Exam  Results for orders placed or performed in visit on 05/08/18  Bayer DCA Hb  A1c Waived  Result Value Ref Range   HB A1C (BAYER DCA - WAIVED) 6.1 <7.0 %  Comprehensive metabolic panel  Result Value Ref Range   Glucose 145 (H) 65 - 99 mg/dL   BUN 13 8 - 27 mg/dL   Creatinine, Ser 4.090.77 0.76 - 1.27 mg/dL   GFR calc non Af Amer 98 >59 mL/min/1.73   GFR calc Af Amer 113 >59 mL/min/1.73   BUN/Creatinine Ratio 17 10 - 24   Sodium 139 134 - 144 mmol/L   Potassium 4.5 3.5 - 5.2 mmol/L   Chloride 100 96 - 106 mmol/L   CO2 27 20 - 29 mmol/L   Calcium 9.3 8.6 - 10.2 mg/dL   Total Protein 6.9 6.0 - 8.5 g/dL   Albumin 4.5 3.6 - 4.8 g/dL   Globulin, Total 2.4 1.5 - 4.5 g/dL   Albumin/Globulin Ratio 1.9 1.2 - 2.2   Bilirubin Total 0.6 0.0 - 1.2 mg/dL   Alkaline Phosphatase 49 39 - 117 IU/L   AST 17 0 - 40 IU/L   ALT 24 0 - 44 IU/L  Lipid panel  Result Value Ref Range   Cholesterol, Total 183 100 - 199 mg/dL   Triglycerides 95 0 - 149 mg/dL   HDL 39 (L) >81>39 mg/dL   VLDL Cholesterol Cal 19 5 - 40 mg/dL   LDL Calculated 191125 (H) 0 - 99 mg/dL   Chol/HDL Ratio 4.7 0.0 - 5.0 ratio  CBC with Differential/Platelet  Result Value Ref Range   WBC 5.8 3.4 - 10.8 x10E3/uL   RBC 4.79  4.14 - 5.80 x10E6/uL   Hemoglobin 14.4 13.0 - 17.7 g/dL   Hematocrit 34.0 37.0 - 51.0 %   MCV 90 79 - 97 fL   MCH 30.1 26.6 - 33.0 pg   MCHC 33.5 31.5 - 35.7 g/dL   RDW 96.4 38.3 - 81.8 %   Platelets 195 150 - 450 x10E3/uL   Neutrophils 64 Not Estab. %   Lymphs 27 Not Estab. %   Monocytes 6 Not Estab. %   Eos 2 Not Estab. %   Basos 1 Not Estab. %   Neutrophils Absolute 3.7 1.4 - 7.0 x10E3/uL   Lymphocytes Absolute 1.6 0.7 - 3.1 x10E3/uL   Monocytes Absolute 0.4 0.1 - 0.9 x10E3/uL   EOS (ABSOLUTE) 0.1 0.0 - 0.4 x10E3/uL   Basophils Absolute 0.0 0.0 - 0.2 x10E3/uL   Immature Granulocytes 0 Not Estab. %   Immature Grans (Abs) 0.0 0.0 - 0.1 x10E3/uL  TSH  Result Value Ref Range   TSH 1.240 0.450 - 4.500 uIU/mL  Urinalysis, Routine w reflex microscopic  Result Value Ref Range   Specific  Gravity, UA 1.010 1.005 - 1.030   pH, UA 7.0 5.0 - 7.5   Color, UA Yellow Yellow   Appearance Ur Clear Clear   Leukocytes, UA Negative Negative   Protein, UA Negative Negative/Trace   Glucose, UA Negative Negative   Ketones, UA Negative Negative   RBC, UA Negative Negative   Bilirubin, UA Negative Negative   Urobilinogen, Ur 0.2 0.2 - 1.0 mg/dL   Nitrite, UA Negative Negative  PSA  Result Value Ref Range   Prostate Specific Ag, Serum 0.8 0.0 - 4.0 ng/mL      Assessment & Plan:   Problem List Items Addressed This Visit    None    Visit Diagnoses    Viral upper respiratory tract infection    -  Primary    Discussed possibility of upper respiratory tract viral type infection care and treatment with continued Mucinex Tylenol. But because of patient's potential exposure and mild COVID-19 symptoms will refer to the emergency room for further evaluation and testing as indicated.  Patient may also just have the flu. Consideration for RMSF or other tickborne illness also.  I discussed the assessment and treatment plan with the patient. The patient was provided an opportunity to ask questions and all were answered. The patient agreed with the plan and demonstrated an understanding of the instructions.   The patient was advised to call back or seek an in-person evaluation if the symptoms worsen or if the condition fails to improve as anticipated.   I provided 21+ minutes of time during this encounter. Follow up plan: Return if symptoms worsen or fail to improve, for As scheduled.

## 2018-11-14 ENCOUNTER — Emergency Department
Admission: EM | Admit: 2018-11-14 | Discharge: 2018-11-14 | Disposition: A | Discharge status: Other Acute Inpatient Hospital | Payer: HRSA Program | Attending: Student in an Organized Health Care Education/Training Program | Admitting: Student in an Organized Health Care Education/Training Program

## 2018-11-14 ENCOUNTER — Emergency Department: Payer: HRSA Program

## 2018-11-14 ENCOUNTER — Inpatient Hospital Stay (HOSPITAL_COMMUNITY)
Admission: AD | Admit: 2018-11-14 | Discharge: 2018-11-18 | DRG: 177 | Disposition: A | Discharge status: Home / Self Care | Payer: BLUE CROSS/BLUE SHIELD | Source: Other Acute Inpatient Hospital | Attending: Internal Medicine | Admitting: Internal Medicine

## 2018-11-14 ENCOUNTER — Encounter: Payer: Self-pay | Admitting: Emergency Medicine

## 2018-11-14 ENCOUNTER — Other Ambulatory Visit: Payer: Self-pay

## 2018-11-14 DIAGNOSIS — R0602 Shortness of breath: Secondary | ICD-10-CM | POA: Diagnosis present

## 2018-11-14 DIAGNOSIS — Z79899 Other long term (current) drug therapy: Secondary | ICD-10-CM | POA: Diagnosis not present

## 2018-11-14 DIAGNOSIS — R739 Hyperglycemia, unspecified: Secondary | ICD-10-CM | POA: Diagnosis not present

## 2018-11-14 DIAGNOSIS — J189 Pneumonia, unspecified organism: Secondary | ICD-10-CM

## 2018-11-14 DIAGNOSIS — Z833 Family history of diabetes mellitus: Secondary | ICD-10-CM

## 2018-11-14 DIAGNOSIS — E119 Type 2 diabetes mellitus without complications: Secondary | ICD-10-CM | POA: Insufficient documentation

## 2018-11-14 DIAGNOSIS — J181 Lobar pneumonia, unspecified organism: Secondary | ICD-10-CM

## 2018-11-14 DIAGNOSIS — E1165 Type 2 diabetes mellitus with hyperglycemia: Secondary | ICD-10-CM | POA: Diagnosis not present

## 2018-11-14 DIAGNOSIS — I10 Essential (primary) hypertension: Secondary | ICD-10-CM | POA: Insufficient documentation

## 2018-11-14 DIAGNOSIS — J1289 Other viral pneumonia: Secondary | ICD-10-CM | POA: Diagnosis present

## 2018-11-14 DIAGNOSIS — N400 Enlarged prostate without lower urinary tract symptoms: Secondary | ICD-10-CM | POA: Diagnosis present

## 2018-11-14 DIAGNOSIS — U071 COVID-19: Principal | ICD-10-CM | POA: Diagnosis present

## 2018-11-14 DIAGNOSIS — Z8249 Family history of ischemic heart disease and other diseases of the circulatory system: Secondary | ICD-10-CM | POA: Diagnosis not present

## 2018-11-14 DIAGNOSIS — D72819 Decreased white blood cell count, unspecified: Secondary | ICD-10-CM | POA: Diagnosis not present

## 2018-11-14 LAB — COMPREHENSIVE METABOLIC PANEL
ALT: 18 U/L (ref 0–44)
AST: 21 U/L (ref 15–41)
Albumin: 3.7 g/dL (ref 3.5–5.0)
Alkaline Phosphatase: 43 U/L (ref 38–126)
Anion gap: 11 (ref 5–15)
BUN: 16 mg/dL (ref 8–23)
CO2: 26 mmol/L (ref 22–32)
Calcium: 8 mg/dL — ABNORMAL LOW (ref 8.9–10.3)
Chloride: 96 mmol/L — ABNORMAL LOW (ref 98–111)
Creatinine, Ser: 0.89 mg/dL (ref 0.61–1.24)
GFR calc Af Amer: >60 mL/min (ref >60)
GFR calc non Af Amer: >60 mL/min (ref >60)
Glucose, Bld: 238 mg/dL — ABNORMAL HIGH (ref 70–99)
Potassium: 3.8 mmol/L (ref 3.5–5.1)
Sodium: 133 mmol/L — ABNORMAL LOW (ref 135–145)
Total Bilirubin: 0.8 mg/dL (ref 0.3–1.2)
Total Protein: 7 g/dL (ref 6.5–8.1)

## 2018-11-14 LAB — CBC WITH DIFFERENTIAL/PLATELET
Abs Immature Granulocytes: 0.01 10*3/uL (ref 0.00–0.07)
Basophils Absolute: 0 10*3/uL (ref 0.0–0.1)
Basophils Relative: 0 %
Eosinophils Absolute: 0 10*3/uL (ref 0.0–0.5)
Eosinophils Relative: 0 %
HCT: 37.5 % — ABNORMAL LOW (ref 39.0–52.0)
Hemoglobin: 13.1 g/dL (ref 13.0–17.0)
Immature Granulocytes: 0 %
Lymphocytes Relative: 10 %
Lymphs Abs: 0.4 10*3/uL — ABNORMAL LOW (ref 0.7–4.0)
MCH: 30 pg (ref 26.0–34.0)
MCHC: 34.9 g/dL (ref 30.0–36.0)
MCV: 86 fL (ref 80.0–100.0)
Monocytes Absolute: 0.3 10*3/uL (ref 0.1–1.0)
Monocytes Relative: 8 %
Neutro Abs: 3.5 10*3/uL (ref 1.7–7.7)
Neutrophils Relative %: 82 %
Platelets: 126 10*3/uL — ABNORMAL LOW (ref 150–400)
RBC: 4.36 MIL/uL (ref 4.22–5.81)
RDW: 11.6 % (ref 11.5–15.5)
WBC: 4.3 10*3/uL (ref 4.0–10.5)
nRBC: 0 % (ref 0.0–0.2)

## 2018-11-14 LAB — URINALYSIS, COMPLETE (UACMP) WITH MICROSCOPIC
Bacteria, UA: NONE SEEN
Bilirubin Urine: NEGATIVE
Glucose, UA: NEGATIVE mg/dL
Ketones, ur: NEGATIVE mg/dL
Leukocytes,Ua: NEGATIVE
Nitrite: NEGATIVE
Protein, ur: 30 mg/dL — AB
Specific Gravity, Urine: 1.027 (ref 1.005–1.030)
pH: 5 (ref 5.0–8.0)

## 2018-11-14 LAB — CBC
HCT: 35.3 % — ABNORMAL LOW (ref 39.0–52.0)
Hemoglobin: 12.3 g/dL — ABNORMAL LOW (ref 13.0–17.0)
MCH: 30.5 pg (ref 26.0–34.0)
MCHC: 34.8 g/dL (ref 30.0–36.0)
MCV: 87.6 fL (ref 80.0–100.0)
Platelets: 115 10*3/uL — ABNORMAL LOW (ref 150–400)
RBC: 4.03 MIL/uL — ABNORMAL LOW (ref 4.22–5.81)
RDW: 11.7 % (ref 11.5–15.5)
WBC: 3.9 10*3/uL — ABNORMAL LOW (ref 4.0–10.5)
nRBC: 0 % (ref 0.0–0.2)

## 2018-11-14 LAB — FIBRIN DERIVATIVES D-DIMER (ARMC ONLY): Fibrin derivatives D-dimer (ARMC): 563.51 ng/mL (FEU) — ABNORMAL HIGH (ref 0.00–499.00)

## 2018-11-14 LAB — LACTIC ACID, PLASMA
Lactic Acid, Venous: 2 mmol/L (ref 0.5–1.9)
Lactic Acid, Venous: 1.3 mmol/L (ref 0.5–1.9)

## 2018-11-14 LAB — CREATININE, SERUM
Creatinine, Ser: 0.8 mg/dL (ref 0.61–1.24)
GFR calc Af Amer: >60 mL/min (ref >60)
GFR calc non Af Amer: >60 mL/min (ref >60)

## 2018-11-14 LAB — PROCALCITONIN: Procalcitonin: <0.1 ng/mL

## 2018-11-14 LAB — FERRITIN: Ferritin: 598 ng/mL — ABNORMAL HIGH (ref 24–336)

## 2018-11-14 LAB — C-REACTIVE PROTEIN
CRP: 6.3 mg/dL — ABNORMAL HIGH (ref <1.0)
CRP: 7 mg/dL — ABNORMAL HIGH (ref <1.0)

## 2018-11-14 LAB — SARS CORONAVIRUS 2 BY RT PCR (HOSPITAL ORDER, PERFORMED IN ~~LOC~~ HOSPITAL LAB): SARS Coronavirus 2: POSITIVE — AB

## 2018-11-14 LAB — BRAIN NATRIURETIC PEPTIDE: B Natriuretic Peptide: 21.9 pg/mL (ref 0.0–100.0)

## 2018-11-14 LAB — D-DIMER, QUANTITATIVE: D-Dimer, Quant: 0.44 ug/mL-FEU (ref 0.00–0.50)

## 2018-11-14 LAB — ABO/RH: ABO/RH(D): O POS

## 2018-11-14 LAB — MAGNESIUM: Magnesium: 2.2 mg/dL (ref 1.7–2.4)

## 2018-11-14 MED ORDER — ONDANSETRON HCL 4 MG/2ML IJ SOLN: 4.0000 mg | Freq: Four times a day (QID) | INTRAMUSCULAR | Status: DC | PRN

## 2018-11-14 MED ORDER — LACTATED RINGERS IV SOLN
INTRAVENOUS | Status: DC
  Administered 2018-11-14 – 2018-11-15 (×2): via INTRAVENOUS

## 2018-11-14 MED ORDER — SODIUM CHLORIDE 0.9 % IV SOLN
500.0000 mg | INTRAVENOUS | Status: DC
  Administered 2018-11-14: 500 mg via INTRAVENOUS
  Filled 2018-11-14: qty 500

## 2018-11-14 MED ORDER — HYDRALAZINE HCL 20 MG/ML IJ SOLN: 10.0000 mg | Freq: Four times a day (QID) | INTRAMUSCULAR | Status: DC | PRN

## 2018-11-14 MED ORDER — METHYLPREDNISOLONE SODIUM SUCC 125 MG IJ SOLR
80.0000 mg | Freq: Two times a day (BID) | INTRAMUSCULAR | Status: DC
  Administered 2018-11-14 – 2018-11-15 (×2): 80 mg via INTRAVENOUS
  Filled 2018-11-14 (×2): qty 2

## 2018-11-14 MED ORDER — ONDANSETRON HCL 4 MG PO TABS: 4.0000 mg | ORAL_TABLET | Freq: Four times a day (QID) | ORAL | Status: DC | PRN

## 2018-11-14 MED ORDER — SODIUM CHLORIDE 0.9% FLUSH
3.0000 mL | Freq: Two times a day (BID) | INTRAVENOUS | Status: DC
  Administered 2018-11-14 – 2018-11-18 (×8): 3 mL via INTRAVENOUS

## 2018-11-14 MED ORDER — POLYETHYLENE GLYCOL 3350 17 G PO PACK: 17.0000 g | PACK | Freq: Every day | ORAL | Status: DC | PRN

## 2018-11-14 MED ORDER — SODIUM CHLORIDE 0.9 % IV SOLN
2.0000 g | INTRAVENOUS | Status: DC
  Administered 2018-11-14: 2 g via INTRAVENOUS
  Filled 2018-11-14: qty 20

## 2018-11-14 MED ORDER — ACETAMINOPHEN 325 MG PO TABS
650.0000 mg | ORAL_TABLET | Freq: Four times a day (QID) | ORAL | Status: DC | PRN
  Administered 2018-11-14 – 2018-11-16 (×2): 650 mg via ORAL
  Filled 2018-11-14 (×2): qty 2

## 2018-11-14 MED ORDER — LACTATED RINGERS IV SOLN
INTRAVENOUS | Status: DC
  Administered 2018-11-14: 15:00:00 via INTRAVENOUS

## 2018-11-14 MED ORDER — ENOXAPARIN SODIUM 40 MG/0.4ML ~~LOC~~ SOLN
40.0000 mg | SUBCUTANEOUS | Status: DC
  Administered 2018-11-14 – 2018-11-17 (×4): 40 mg via SUBCUTANEOUS
  Filled 2018-11-14 (×4): qty 0.4

## 2018-11-14 MED ORDER — ACETAMINOPHEN 500 MG PO TABS
1000.0000 mg | ORAL_TABLET | Freq: Once | ORAL | Status: AC
  Administered 2018-11-14: 1000 mg via ORAL
  Filled 2018-11-14: qty 2

## 2018-11-14 NOTE — ED Notes (Signed)
Wife Gavin Pound # [phone]

## 2018-11-14 NOTE — H&P (Signed)
TRH H&P   Patient Demographics:    Scott Hester, is a 63 y.o. male  MRN: [id number]   DOB - [birth year]  Admit Date - 11/14/2018  Outpatient Primary MD for the patient is Steele Sizer, MD  Patient coming from: Home >> [facility]  CC - Cough    HPI:    Scott Hester  is a 63 y.o. 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  Chest x-ray suggestive of COVID-19 pneumonia, he was thought to have mild exertional hypoxia and he was transferred to [facility] for further care.    Review of systems:    A full 10 point Review of Systems was done, except as stated above, all other Review of Systems were negative.   With Past History of the following :    Past Medical History:  Diagnosis Date  . BPH (benign prostatic hyperplasia)   . Hypertension      No major surgeries in the past.   Social History:     Social History   Tobacco Use  . Smoking status: Never Smoker  . Smokeless tobacco: Never Used  Substance Use Topics  . Alcohol use: No         Family History :     Family History  Problem Relation Age of Onset  . Hypertension Mother   . Hypertension Father   . Diabetes Father   . Hypertension Maternal Grandmother   . Diabetes Maternal Grandmother        Home Medications:   Prior to Admission medications   Medication Sig Start Date End Date Taking? Authorizing Provider  amLODipine (NORVASC) 5 MG tablet  Take 1 tablet (5 mg total) by mouth daily. 05/08/18   Steele Sizer, MD  benazepril (LOTENSIN) 40 MG tablet Take 1 tablet (40 mg total) by mouth daily. 05/08/18   Steele Sizer, MD  finasteride (PROSCAR) 5 MG tablet Take 1 tablet (5 mg total) by mouth daily. 05/08/18   Steele Sizer, MD  tamsulosin [facility])  0.4 MG CAPS capsule Take 1 capsule (0.4 mg total) by mouth daily. 05/08/18   Steele Sizerrissman, Mark A, MD     Allergies:    No Known Allergies   Physical Exam:   Vitals  There were no vitals taken for this visit.   1. General obese middle-aged Caucasian male lying in hospital bed in no distress,  2. Normal affect and insight, Not Suicidal or Homicidal, Awake Alert, Oriented X 3.  3. No F.N deficits, ALL C.Nerves Intact, Strength 5/5 all 4 extremities, Sensation intact all 4 extremities, Plantars down going.  4. Ears and Eyes appear Normal, Conjunctivae clear, PERRLA. Moist Oral Mucosa.  5. Supple Neck, No JVD, No cervical lymphadenopathy appriciated, No Carotid Bruits.  6. Symmetrical Chest wall movement, Good air movement bilaterally, fine crackles bilaterally,  7. RRR, No Gallops, Rubs or Murmurs, No Parasternal Heave.  8. Positive Bowel Sounds, Abdomen Soft, No tenderness, No organomegaly appriciated,No rebound -guarding or rigidity.  9.  No Cyanosis, Normal Skin Turgor, No Skin Rash or Bruise.  10. Good muscle tone,  joints appear normal , no effusions, Normal ROM.  11. No Palpable Lymph Nodes in Neck or Axillae       Data Review:    CBC Recent Labs  Lab 11/14/18 1136  WBC 4.3  HGB 13.1  HCT 37.5*  PLT 126*  MCV 86.0  MCH 30.0  MCHC 34.9  RDW 11.6  LYMPHSABS 0.4*  MONOABS 0.3  EOSABS 0.0  BASOSABS 0.0   ------------------------------------------------------------------------------------------------------------------  Chemistries  Recent Labs  Lab 11/14/18 1136 11/14/18 1401  NA 133*  --   K 3.8  --   CL 96*  --   CO2 26  --   GLUCOSE 238*   --   BUN 16  --   CREATININE 0.89  --   CALCIUM 8.0*  --   MG  --  2.2  AST 21  --   ALT 18  --   ALKPHOS 43  --   BILITOT 0.8  --    ------------------------------------------------------------------------------------------------------------------ estimated creatinine clearance is 90 mL/min (by C-G formula based on SCr of 0.89 mg/dL). ------------------------------------------------------------------------------------------------------------------ No results for input(s): TSH, T4TOTAL, T3FREE, THYROIDAB in the last 72 hours.  Invalid input(s): FREET3  Coagulation profile No results for input(s): INR, PROTIME in the last 168 hours. ------------------------------------------------------------------------------------------------------------------- No results for input(s): DDIMER in the last 72 hours. -------------------------------------------------------------------------------------------------------------------  Cardiac Enzymes No results for input(s): CKMB, TROPONINI, MYOGLOBIN in the last 168 hours.  Invalid input(s): CK ------------------------------------------------------------------------------------------------------------------ No results found for: BNP   ---------------------------------------------------------------------------------------------------------------  Urinalysis    Component Value Date/Time   COLORURINE AMBER (A) 11/14/2018 1136   APPEARANCEUR HAZY (A) 11/14/2018 1136   APPEARANCEUR Clear 05/08/2018 0842   LABSPEC 1.027 11/14/2018 1136   LABSPEC 1.008 11/14/2012 1949   PHURINE 5.0 11/14/2018 1136   GLUCOSEU NEGATIVE 11/14/2018 1136   GLUCOSEU >=500 11/14/2012 1949   HGBUR SMALL (A) 11/14/2018 1136   BILIRUBINUR NEGATIVE 11/14/2018 1136   BILIRUBINUR Negative 05/08/2018 0842   BILIRUBINUR Negative 11/14/2012 1949   KETONESUR NEGATIVE 11/14/2018 1136   PROTEINUR 30 (A) 11/14/2018 1136   NITRITE NEGATIVE 11/14/2018 1136   LEUKOCYTESUR NEGATIVE  11/14/2018 1136   LEUKOCYTESUR Negative 11/14/2012 1949    ----------------------------------------------------------------------------------------------------------------  COVID-19 Labs  Recent Labs    11/14/18 1401  FERRITIN 598*    Lab Results  Component Value Date   SARSCOV2NAA POSITIVE (A) 11/14/2018     Imaging Results:    [address]  Result Date: 11/14/2018 CLINICAL DATA:  Worsening shortness of breath and cough. EXAM: PORTABLE CHEST 1 VIEW COMPARISON:  None. FINDINGS: Heart size upper limits of normal. There appears to be pulmonary vascular prominence without frank edema. Patient has taken a poor inspiration. Question patchy bilateral pulmonary infiltrates without lobar consolidation, collapse or effusion. No acute bone finding. IMPRESSION: Poor inspiration. Heart size upper limits of normal. Question pulmonary venous hypertension without frank edema. Patchy bilateral pulmonary densities which could relate in part to the poor inspiration or could represent patchy bilateral bronchopneumonia. Consider two-view chest radiography when able. Electronically Signed   By: Paulina Fusi M.D.   On: 11/14/2018 11:54    My personal review of EKG: Rhythm NSR, no acute changes, QTC 415 ms   Assessment & Plan:      1.  Acute COVID 19 infection with pneumonia.  He is currently not hypoxic, there was some reported exertional hypoxia at [facility] ER but none here.  His ferritin was mildly elevated, will check d-dimer along with CRP.  He currently does not appear to be toxic, will place him on IV Solu-Medrol, continue supportive care with oxygen nebulizer treatments.  Monitor inflammatory markers.  Since he has fine crackles wheeze also check BNP x1.  Trend inflammatory markers, trend clinically, if develops hypoxia or inflammation increases low threshold for giving Actemra.  Gentle hydration overnight.  2.  Hypertension.  Vital signs noted from [place] ER blood pressure last was  127/81, will add hydralazine as needed for now.  3.  BPH.  Home medications will be continued.    DVT Prophylaxis    Lovenox    AM Labs Ordered, also please review Full Orders  Family Communication: Admission, patients condition and plan of care including tests being ordered have been discussed with the patient  who indicates understanding and agree with the plan and Code Status.  Code Status Full  Likely DC to  Home  Condition Fair  Consults called: None    Admission status: Inpt    Time spent in minutes : 35   Susa Raring M.D on 11/14/2018 at 6:06 PM  To page go to [email or web address] - password [email or web address]

## 2018-11-14 NOTE — ED Notes (Signed)
Patient signed transfer consent, [facility] here to transport patient to [facility]

## 2018-11-14 NOTE — ED Notes (Signed)
Patient's wife updated on [facility] room and talked to patient. [facility] 20 min out, given report.

## 2018-11-14 NOTE — ED Notes (Signed)
Report given to [facility] at [facility], will call wife before transfer

## 2018-11-14 NOTE — ED Notes (Signed)
Date and time results received: 11/14/18 1221 (use smartphrase ".now" to insert current time)  Test: lactic acid Critical Value: 2.0  Name of Provider Notified: Roxan Hockey  Orders Received? Or Actions Taken?: Actions Taken: abx

## 2018-11-14 NOTE — ED Notes (Signed)
EMTALA reviewed. 

## 2018-11-14 NOTE — ED Triage Notes (Signed)
Patient arrived to [facility] with persistent respiratory symptoms since he was seen last week for a sinus infection. Reports some shortness of breath, GI upset, cough, fatigue. [facility] sent patient over here with reports of "desats to low 80s". Patient 99% RA currently.

## 2018-11-14 NOTE — ED Provider Notes (Signed)
[facility] Emergency Department Provider Note    First MD Initiated Contact with Patient 11/14/18 1127     (approximate)  I have reviewed the triage vital signs and the nursing notes.   HISTORY  Chief Complaint Shortness of Breath    HPI Scott Hester is a 63 y.o. male [place] past medical history presents the ER for progressively worsening shortness of breath cough nausea and vomiting.  States that he was seen earlier this week for sinus infection was sent home on antibiotics without improvement.  States he is having frequent chills particularly at night.  Does have some muscle aches and myalgias associated with this.  Initially went to the [facility] this morning for reevaluation was found to be hypoxic to 80% on room air and directed to the ER for further evaluation.    Past Medical History:  Diagnosis Date  . BPH (benign prostatic hyperplasia)   . Hypertension    Family History  Problem Relation Age of Onset  . Hypertension Mother   . Hypertension Father   . Diabetes Father   . Hypertension Maternal Grandmother   . Diabetes Maternal Grandmother    History reviewed. No pertinent surgical history. Patient Active Problem List   Diagnosis Date Noted  . DM (diabetes mellitus), type 2 (HCC) 05/22/2017  . Essential hypertension 04/15/2015  . BPH with obstruction/lower urinary tract symptoms 04/15/2015      Prior to Admission medications   Medication Sig Start Date End Date Taking? Authorizing Provider  amLODipine (NORVASC) 5 MG tablet Take 1 tablet (5 mg total) by mouth daily. 05/08/18   Steele Sizer, MD  benazepril (LOTENSIN) 40 MG tablet Take 1 tablet (40 mg total) by mouth daily. 05/08/18   Steele Sizer, MD  finasteride (PROSCAR) 5 MG tablet Take 1 tablet (5 mg total) by mouth daily. 05/08/18   Steele Sizer, MD  tamsulosin (FLOMAX) 0.4 MG CAPS capsule Take 1 capsule (0.4 mg total) by mouth daily. 05/08/18   Steele Sizer, MD     Allergies Patient has no known allergies.    Social History Social History   Tobacco Use  . Smoking status: Never Smoker  . Smokeless tobacco: Never Used  Substance Use Topics  . Alcohol use: No  . Drug use: No    Review of Systems Patient denies headaches, rhinorrhea, blurry vision, numbness, shortness of breath, chest pain, edema, cough, abdominal pain, nausea, vomiting, diarrhea, dysuria, fevers, rashes or hallucinations unless otherwise stated above in HPI. ____________________________________________   PHYSICAL EXAM:  VITAL SIGNS: Vitals:   11/14/18 1127 11/14/18 1132  BP:  136/66  Pulse:  73  Resp:  (!) 21  Temp:  (!) 101.5 F (38.6 C)  SpO2: 96% 99%    Constitutional: Alert and oriented.  Eyes: Conjunctivae are normal.  Head: Atraumatic. Nose: No congestion/rhinnorhea. Mouth/Throat: Mucous membranes are moist.   Neck: No stridor. Painless ROM.  Cardiovascular: Normal rate, regular rhythm. Grossly normal heart sounds.  Good peripheral circulation. Respiratory: Normal respiratory effort.  No retractions. Lungs with coarse bibasilar breathsounds Gastrointestinal: Soft and nontender. No distention. No abdominal bruits. No CVA tenderness. Genitourinary:  Musculoskeletal: No lower extremity tenderness nor edema.  No joint effusions. Neurologic:  Normal speech and language. No gross focal neurologic deficits are appreciated. No facial droop Skin:  Skin is warm, dry and intact. No rash noted. Psychiatric: Mood and affect are normal. Speech and behavior are normal.  ____________________________________________   LABS (all labs ordered  are listed, but only abnormal results are displayed)  Results for orders placed or performed during the hospital encounter of 11/14/18 (from the past 24 hour(s))  Lactic acid, plasma     Status: Abnormal   Collection Time: 11/14/18 11:36 AM  Result Value Ref Range   Lactic Acid, Venous 2.0 (HH) 0.5 - 1.9 mmol/L   Comprehensive metabolic panel     Status: Abnormal   Collection Time: 11/14/18 11:36 AM  Result Value Ref Range   Sodium 133 (L) 135 - 145 mmol/L   Potassium 3.8 3.5 - 5.1 mmol/L   Chloride 96 (L) 98 - 111 mmol/L   CO2 26 22 - 32 mmol/L   Glucose, Bld 238 (H) 70 - 99 mg/dL   BUN 16 8 - 23 mg/dL   Creatinine, Ser 1.610.89 0.61 - 1.24 mg/dL   Calcium 8.0 (L) 8.9 - 10.3 mg/dL   Total Protein 7.0 6.5 - 8.1 g/dL   Albumin 3.7 3.5 - 5.0 g/dL   AST 21 15 - 41 U/L   ALT 18 0 - 44 U/L   Alkaline Phosphatase 43 38 - 126 U/L   Total Bilirubin 0.8 0.3 - 1.2 mg/dL   GFR calc non Af Amer >60 >60 mL/min   GFR calc Af Amer >60 >60 mL/min   Anion gap 11 5 - 15  CBC WITH DIFFERENTIAL     Status: Abnormal   Collection Time: 11/14/18 11:36 AM  Result Value Ref Range   WBC 4.3 4.0 - 10.5 K/uL   RBC 4.36 4.22 - 5.81 MIL/uL   Hemoglobin 13.1 13.0 - 17.0 g/dL   HCT 09.637.5 (L) 04.539.0 - 40.952.0 %   MCV 86.0 80.0 - 100.0 fL   MCH 30.0 26.0 - 34.0 pg   MCHC 34.9 30.0 - 36.0 g/dL   RDW 81.111.6 91.411.5 - 78.215.5 %   Platelets 126 (L) 150 - 400 K/uL   nRBC 0.0 0.0 - 0.2 %   Neutrophils Relative % 82 %   Neutro Abs 3.5 1.7 - 7.7 K/uL   Lymphocytes Relative 10 %   Lymphs Abs 0.4 (L) 0.7 - 4.0 K/uL   Monocytes Relative 8 %   Monocytes Absolute 0.3 0.1 - 1.0 K/uL   Eosinophils Relative 0 %   Eosinophils Absolute 0.0 0.0 - 0.5 K/uL   Basophils Relative 0 %   Basophils Absolute 0.0 0.0 - 0.1 K/uL   Immature Granulocytes 0 %   Abs Immature Granulocytes 0.01 0.00 - 0.07 K/uL  Procalcitonin     Status: None   Collection Time: 11/14/18 11:36 AM  Result Value Ref Range   Procalcitonin <0.10 ng/mL  SARS Coronavirus 2 [facility](Hospital order, Performed in [facility] lab)     Status: Abnormal   Collection Time: 11/14/18 11:51 AM  Result Value Ref Range   SARS Coronavirus 2 POSITIVE (A) NEGATIVE   ____________________________________________  EKG My review and personal interpretation at Time: 11:40   Indication: sob   Rate: 70  Rhythm: sinus Axis: normal Other: normal intervals, no stemi ____________________________________________  RADIOLOGY  I personally reviewed all radiographic images ordered to evaluate for the above acute complaints and reviewed radiology reports and findings.  These findings were personally discussed with the patient.  Please see medical record for radiology report.  ____________________________________________   PROCEDURES  Procedure(s) performed:  Procedures    Critical Care performed: no ____________________________________________   INITIAL IMPRESSION / ASSESSMENT AND PLAN / ED COURSE  Pertinent labs & imaging results that were available  during my care of the patient were reviewed by me and considered in my medical decision making (see chart for details).   DDX: Asthma, copd, CHF, pna, ptx, malignancy, Pe, anemia   Dashaun Daskal Kissner is a 64 y.o. who presents to the ED with symptoms as described above.  Patient with evidence of hypoxia with exertion.  Does appear frail and weak.  Protecting his airway right now and at rest does not need any supplemental oxygen but with any movement does become tachypneic and weak.  The patient will be placed on continuous pulse oximetry and telemetry for monitoring.  Laboratory evaluation will be sent to evaluate for the above complaints.     Clinical Course as of Nov 14 1426  Thu Nov 14, 2018  1343 Cover test is positive.  Given his hypoxia on arrival which could know to clinic with persistent symptoms I do believe patient will benefit from observation.   [PR]    Clinical Course User Index [PR] Willy Eddy, MD   The patient was evaluated in Emergency Department today for the symptoms described in the history of present illness. He/she was evaluated in the context of the global COVID-19 pandemic, which necessitated consideration that the patient might be at risk for infection with the SARS-CoV-2 virus that causes COVID-19.  Institutional protocols and algorithms that pertain to the evaluation of patients at risk for COVID-19 are in a state of rapid change based on information released by regulatory bodies including the CDC and federal and state organizations. These policies and algorithms were followed during the patient's care in the ED.   As part of my medical decision making, I reviewed the following data within the electronic MEDICAL RECORD NUMBER Nursing notes reviewed and incorporated, Labs reviewed, notes from prior ED visits and [place] Controlled Substance Database   ____________________________________________   FINAL CLINICAL IMPRESSION(S) / ED DIAGNOSES  Final diagnoses:  SOB (shortness of breath)  COVID-19 virus detected  Pneumonia of both lower lobes due to infectious organism [facility])      NEW MEDICATIONS STARTED DURING THIS VISIT:  New Prescriptions   No medications on file     Note:  This document was prepared using Dragon voice recognition software and may include unintentional dictation errors.    Willy Eddy, MD 11/14/18 [phone]

## 2018-11-15 ENCOUNTER — Inpatient Hospital Stay (HOSPITAL_COMMUNITY): Payer: BLUE CROSS/BLUE SHIELD

## 2018-11-15 DIAGNOSIS — D72819 Decreased white blood cell count, unspecified: Secondary | ICD-10-CM

## 2018-11-15 LAB — CBC WITH DIFFERENTIAL/PLATELET
Abs Immature Granulocytes: 0.02 10*3/uL (ref 0.00–0.07)
Basophils Absolute: 0 10*3/uL (ref 0.0–0.1)
Basophils Relative: 0 %
Eosinophils Absolute: 0 10*3/uL (ref 0.0–0.5)
Eosinophils Relative: 0 %
HCT: 39.8 % (ref 39.0–52.0)
Hemoglobin: 13.5 g/dL (ref 13.0–17.0)
Immature Granulocytes: 1 %
Lymphocytes Relative: 15 %
Lymphs Abs: 0.4 10*3/uL — ABNORMAL LOW (ref 0.7–4.0)
MCH: 29.7 pg (ref 26.0–34.0)
MCHC: 33.9 g/dL (ref 30.0–36.0)
MCV: 87.7 fL (ref 80.0–100.0)
Monocytes Absolute: 0.1 10*3/uL (ref 0.1–1.0)
Monocytes Relative: 4 %
Neutro Abs: 2.4 10*3/uL (ref 1.7–7.7)
Neutrophils Relative %: 80 %
Platelets: 128 10*3/uL — ABNORMAL LOW (ref 150–400)
RBC: 4.54 MIL/uL (ref 4.22–5.81)
RDW: 11.6 % (ref 11.5–15.5)
WBC: 2.9 10*3/uL — ABNORMAL LOW (ref 4.0–10.5)
nRBC: 0 % (ref 0.0–0.2)

## 2018-11-15 LAB — D-DIMER, QUANTITATIVE: D-Dimer, Quant: 0.41 ug/mL-FEU (ref 0.00–0.50)

## 2018-11-15 LAB — HIV ANTIBODY (ROUTINE TESTING W REFLEX): HIV Screen 4th Generation wRfx: NONREACTIVE

## 2018-11-15 LAB — COMPREHENSIVE METABOLIC PANEL
ALT: 18 U/L (ref 0–44)
AST: 20 U/L (ref 15–41)
Albumin: 3.5 g/dL (ref 3.5–5.0)
Alkaline Phosphatase: 41 U/L (ref 38–126)
Anion gap: 8 (ref 5–15)
BUN: 16 mg/dL (ref 8–23)
CO2: 28 mmol/L (ref 22–32)
Calcium: 8.4 mg/dL — ABNORMAL LOW (ref 8.9–10.3)
Chloride: 100 mmol/L (ref 98–111)
Creatinine, Ser: 0.77 mg/dL (ref 0.61–1.24)
GFR calc Af Amer: >60 mL/min (ref >60)
GFR calc non Af Amer: >60 mL/min (ref >60)
Glucose, Bld: 226 mg/dL — ABNORMAL HIGH (ref 70–99)
Potassium: 4.3 mmol/L (ref 3.5–5.1)
Sodium: 136 mmol/L (ref 135–145)
Total Bilirubin: 0.8 mg/dL (ref 0.3–1.2)
Total Protein: 7.1 g/dL (ref 6.5–8.1)

## 2018-11-15 LAB — URINE CULTURE
Culture: NO GROWTH
Special Requests: NORMAL

## 2018-11-15 LAB — GLUCOSE, CAPILLARY
Glucose-Capillary: 297 mg/dL — ABNORMAL HIGH (ref 70–99)
Glucose-Capillary: 262 mg/dL — ABNORMAL HIGH (ref 70–99)
Glucose-Capillary: 197 mg/dL — ABNORMAL HIGH (ref 70–99)

## 2018-11-15 LAB — MAGNESIUM: Magnesium: 2.3 mg/dL (ref 1.7–2.4)

## 2018-11-15 LAB — C-REACTIVE PROTEIN: CRP: 8.4 mg/dL — ABNORMAL HIGH (ref <1.0)

## 2018-11-15 LAB — FERRITIN: Ferritin: 801 ng/mL — ABNORMAL HIGH (ref 24–336)

## 2018-11-15 MED ORDER — VITAMIN C 500 MG PO TABS
500.0000 mg | ORAL_TABLET | Freq: Every day | ORAL | Status: DC
  Administered 2018-11-15 – 2018-11-18 (×4): 500 mg via ORAL
  Filled 2018-11-15 (×4): qty 1

## 2018-11-15 MED ORDER — FAMOTIDINE 20 MG PO TABS
20.0000 mg | ORAL_TABLET | Freq: Every day | ORAL | Status: DC
  Administered 2018-11-15 – 2018-11-18 (×4): 20 mg via ORAL
  Filled 2018-11-15 (×4): qty 1

## 2018-11-15 MED ORDER — INSULIN ASPART 100 UNIT/ML ~~LOC~~ SOLN
0.0000 [IU] | Freq: Three times a day (TID) | SUBCUTANEOUS | Status: DC
  Administered 2018-11-15: 18:00:00 8 [IU] via SUBCUTANEOUS
  Administered 2018-11-16: 2 [IU] via SUBCUTANEOUS
  Administered 2018-11-16 – 2018-11-17 (×3): 3 [IU] via SUBCUTANEOUS
  Administered 2018-11-17: 18:00:00 8 [IU] via SUBCUTANEOUS
  Administered 2018-11-17 – 2018-11-18 (×2): 3 [IU] via SUBCUTANEOUS
  Administered 2018-11-18: 11 [IU] via SUBCUTANEOUS

## 2018-11-15 MED ORDER — PREDNISONE 20 MG PO TABS
40.0000 mg | ORAL_TABLET | Freq: Two times a day (BID) | ORAL | Status: DC
  Administered 2018-11-15 – 2018-11-18 (×6): 40 mg via ORAL
  Filled 2018-11-15: qty 4
  Filled 2018-11-15 (×2): qty 2
  Filled 2018-11-15: qty 4
  Filled 2018-11-15: qty 2
  Filled 2018-11-15: qty 4

## 2018-11-15 MED ORDER — ZINC SULFATE 220 (50 ZN) MG PO CAPS
220.0000 mg | ORAL_CAPSULE | Freq: Every day | ORAL | Status: DC
  Administered 2018-11-15 – 2018-11-18 (×4): 220 mg via ORAL
  Filled 2018-11-15 (×4): qty 1

## 2018-11-15 NOTE — Progress Notes (Addendum)
PROGRESS NOTE  Scott Hester ZOX:[id number] DOB: May 10, 1956 DOA: 11/14/2018  PCP: Steele Sizer, MD  Brief History/Interval Summary: 63 y.o. 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  Chest x-ray suggestive of COVID-19 pneumonia, he was thought to have mild exertional hypoxia and he was transferred to [facility] for further care.  Reason for Visit: Acute COVID-19 infection with acute hypoxic respiratory failure  Consultants: None  Procedures: None  Antibiotics: Anti-infectives (From admission, onward)   None       Subjective/Interval History: Patient states that he feels so well this morning.  Denies any difficulty breathing.  Cough is minimal.  No chest pain.  No nausea vomiting.   Assessment/Plan:  Acute Hypoxic Resp. Failure due to Acute Covid 19 Viral Illness during the ongoing 2020 Covid 19 Pandemic  COVID-19 Labs  Recent Labs    11/14/18 1401 11/14/18 1930 11/15/18 0440  DDIMER  --  0.44 0.41  FERRITIN 598*  --  801*  CRP 6.3* 7.0*  --     Lab Results  Component Value Date   SARSCOV2NAA POSITIVE (A) 11/14/2018     Last episode of fever: Temperature of 100.2 on 5/7 at 3 PM.  Patient's T-max was 101.5 at 11 AM on 5/7. Oxygen requirements: None currently.  Respiratory status has improved. Antibiotics: None Steroids: Placed on IV Solu-Medrol as he was wheezing.  Change to oral steroids today. Actemra: Not indicated Vitramin C and Zinc: Ordered  Prone positioning: Patient encouraged to stay in prone position as much as possible.  Inflammatory markers and d-dimer as above.  Ferritin noted to be slightly higher today.  D-dimer is normal.  Leukopenia is noted.  BNP was  normal at 21.9.  Lactic acid level normal at 1.3.  Procalcitonin less than 0.1.   The treatment plan and use of medications and known side effects were discussed with patient/family. It was clearly explained that there is no proven definitive treatment for COVID-19 infection yet. Any medications used here are based on case reports/anecdotal data which are not peer-reviewed and has not been studied using randomized control trials.  Complete risks and long-term side effects are unknown, however in the best clinical judgment they seem to be of some clinical benefit rather than medical risks.  Patient/family agree with the treatment plan and want to receive these treatments as indicated.  Essential hypertension Blood pressure is reasonably well controlled.  Continue to monitor.  History of BPH Continue home medications.  Hyperglycemia Possibly due to steroids.  Patient with history of diet-controlled diabetes.  Check HbA1c.  SSI.  DVT Prophylaxis:  Lovenox  Lab Results  Component Value Date   PLT 128 (L) 11/15/2018     PUD Prophylaxis: Start Pepcid Code Status: Full code Family Communication: Discussed with the patient.  Will discuss with his spouse later today. Disposition Plan: Hopefully home in the next 1 to 2 days.    Medications:  Scheduled: . enoxaparin (LOVENOX) injection  40 mg Subcutaneous Q24H  . predniSONE  40 mg Oral BID WC  . sodium chloride flush  3 mL Intravenous Q12H  . vitamin C  500 mg Oral Daily  . zinc sulfate  220 mg Oral  Daily   Continuous:  ZOX:[id number]:acetaminophen, hydrALAZINE, ondansetron **OR** ondansetron (ZOFRAN) IV, polyethylene glycol   Objective:  Vital Signs  Vitals:   11/14/18 2320 11/15/18 0500 11/15/18 0841 11/15/18 1000  BP: (!) 102/56  113/66   Pulse: 63     Resp: 16     Temp: 99.2 F (37.3 C) 99.8 F (37.7 C)  99.1 F (37.3 C)  TempSrc: Oral   Oral  SpO2: 96%     Weight:      Height:        Intake/Output Summary (Last 24 hours)  at 11/15/2018 1058 Last data filed at 11/15/2018 0635 Gross per 24 hour  Intake 724.2 ml  Output 250 ml  Net 474.2 ml   Filed Weights   11/14/18 1800  Weight: 86.2 kg    General appearance: Awake alert.  In no distress Resp: Normal effort.  Coarse breath sounds bilaterally.  No wheezing rales or rhonchi today. Cardio: S1-S2 is normal regular.  No S3-S4.  No rubs murmurs or bruit GI: Abdomen is soft.  Nontender nondistended.  Bowel sounds are present normal.  No masses organomegaly Extremities: No edema.  Full range of motion of lower extremities. Neurologic: Alert and oriented x3.  No focal neurological deficits.    Lab Results:  Data Reviewed: I have personally reviewed following labs and imaging studies  CBC: Recent Labs  Lab 11/14/18 1136 11/14/18 1930 11/15/18 0440  WBC 4.3 3.9* 2.9*  NEUTROABS 3.5  --  2.4  HGB 13.1 12.3* 13.5  HCT 37.5* 35.3* 39.8  MCV 86.0 87.6 87.7  PLT 126* 115* 128*    Basic Metabolic Panel: Recent Labs  Lab 11/14/18 1136 11/14/18 1401 11/14/18 1930 11/15/18 0440  NA 133*  --   --  136  K 3.8  --   --  4.3  CL 96*  --   --  100  CO2 26  --   --  28  GLUCOSE 238*  --   --  226*  BUN 16  --   --  16  CREATININE 0.89  --  0.80 0.77  CALCIUM 8.0*  --   --  8.4*  MG  --  2.2  --  2.3    GFR: Estimated Creatinine Clearance: 100.1 mL/min (by C-G formula based on SCr of 0.77 mg/dL).  Liver Function Tests: Recent Labs  Lab 11/14/18 1136 11/15/18 0440  AST 21 20  ALT 18 18  ALKPHOS 43 41  BILITOT 0.8 0.8  PROT 7.0 7.1  ALBUMIN 3.7 3.5    Anemia Panel: Recent Labs    11/14/18 1401 11/15/18 0440  FERRITIN 598* 801*    Recent Results (from the past 240 hour(s))  Blood Culture (routine x 2)     Status: None (Preliminary result)   Collection Time: 11/14/18 11:51 AM  Result Value Ref Range Status   Specimen Description BLOOD RIGHT ANTECUBITAL  Final   Special Requests   Final    BOTTLES DRAWN AEROBIC AND ANAEROBIC Blood  Culture results may not be optimal due to an excessive volume of blood received in culture bottles   Culture   Final    NO GROWTH < 24 HOURS Performed at [facility], [address] [place], [place] [address]    Report Status PENDING  Incomplete  SARS Coronavirus 2 [facility](Hospital order, Performed in [facility] hospital lab)     Status: Abnormal   Collection Time: 11/14/18 11:51 AM  Result Value Ref Range Status  SARS Coronavirus 2 POSITIVE (A) NEGATIVE Final    Comment: RESULT CALLED TO, READ BACK BY AND VERIFIED WITH:  KATE BUCKNUM AT 1326 11/14/2018 SDR (NOTE) If result is NEGATIVE SARS-CoV-2 target nucleic acids are NOT DETECTED. The SARS-CoV-2 RNA is generally detectable in upper and lower  respiratory specimens during the acute phase of infection. The lowest  concentration of SARS-CoV-2 viral copies this assay can detect is 250  copies / mL. A negative result does not preclude SARS-CoV-2 infection  and should not be used as the sole basis for treatment or other  patient management decisions.  A negative result may occur with  improper specimen collection / handling, submission of specimen other  than nasopharyngeal swab, presence of viral mutation(s) within the  areas targeted by this assay, and inadequate number of viral copies  (<250 copies / mL). A negative result must be combined with clinical  observations, patient history, and epidemiological information. If result is POSITIVE SARS-CoV-2 target nucleic acids are DETECTED. Th e SARS-CoV-2 RNA is generally detectable in upper and lower  respiratory specimens during the acute phase of infection.  Positive  results are indicative of active infection with SARS-CoV-2.  Clinical  correlation with patient history and other diagnostic information is  necessary to determine patient infection status.  Positive results do  not rule out bacterial infection or co-infection with other viruses. If result is PRESUMPTIVE  POSTIVE SARS-CoV-2 nucleic acids MAY BE PRESENT.   A presumptive positive result was obtained on the submitted specimen  and confirmed on repeat testing.  While 2019 novel coronavirus  (SARS-CoV-2) nucleic acids may be present in the submitted sample  additional confirmatory testing may be necessary for epidemiological  and / or clinical management purposes  to differentiate between  SARS-CoV-2 and other Sarbecovirus currently known to infect humans.  If clinically indicated additional testing with an alternate test  methodology [phone]) is  advised. The SARS-CoV-2 RNA is generally  detectable in upper and lower respiratory specimens during the acute  phase of infection. The expected result is Negative. Fact Sheet for Patients:  BoilerBrush.com.cy Fact Sheet for Healthcare Providers: https://pope.com/ This test is not yet approved or cleared by the [place] FDA and has been authorized for detection and/or diagnosis of SARS-CoV-2 by FDA under an Emergency Use Authorization (EUA).  This EUA will remain in effect (meaning this test can be used) for the duration of the COVID-19 declaration under Section 564(b)(1) of the Act, 21 U.S.C. section 360bbb-3(b)(1), unless the authorization is terminated or revoked sooner. Performed at [facility], [address] [place], [place] [address]   Blood Culture (routine x 2)     Status: None (Preliminary result)   Collection Time: 11/14/18 11:53 AM  Result Value Ref Range Status   Specimen Description BLOOD LEFT ANTECUBITAL  Final   Special Requests   Final    BOTTLES DRAWN AEROBIC AND ANAEROBIC Blood Culture adequate volume   Culture   Final    NO GROWTH < 24 HOURS Performed at [facility], [address], [place], [place] [address]    Report Status PENDING  Incomplete      Radiology Studies: Dg Chest Port 1 View  Result Date: 11/15/2018 CLINICAL DATA:  COVID-19.   Shortness of breath EXAM: PORTABLE CHEST 1 VIEW COMPARISON:  Yesterday FINDINGS: Mild cardiomegaly. Stable mediastinal contours. Mild interstitial coarsening and subtle patchy density. No convincing change from prior. No effusion or pneumothorax. IMPRESSION: Mild pulmonary opacification without definite progression. Electronically Signed  By: Marnee Spring M.D.   On: 11/15/2018 05:23   Dg Chest Port 1 View  Result Date: 11/14/2018 CLINICAL DATA:  Worsening shortness of breath and cough. EXAM: PORTABLE CHEST 1 VIEW COMPARISON:  None. FINDINGS: Heart size upper limits of normal. There appears to be pulmonary vascular prominence without frank edema. Patient has taken a poor inspiration. Question patchy bilateral pulmonary infiltrates without lobar consolidation, collapse or effusion. No acute bone finding. IMPRESSION: Poor inspiration. Heart size upper limits of normal. Question pulmonary venous hypertension without frank edema. Patchy bilateral pulmonary densities which could relate in part to the poor inspiration or could represent patchy bilateral bronchopneumonia. Consider two-view chest radiography when able. Electronically Signed   By: Paulina Fusi M.D.   On: 11/14/2018 11:54       LOS: 1 day   Leith Szafranski  [facility] Pager on [email or web address]  11/15/2018, 10:58 AM

## 2018-11-15 NOTE — Progress Notes (Signed)
Inpatient Diabetes Program Recommendations  AACE/ADA: New Consensus Statement on Inpatient Glycemic Control (2015)  Target Ranges:  Prepandial:   less than 140 mg/dL      Peak postprandial:   less than 180 mg/dL (1-2 hours)      Critically ill patients:  140 - 180 mg/dL   Lab Results  Component Value Date   HGBA1C 6.1 05/08/2018    Review of Glycemic Control Results for EBBIE, BOURBON (MRN [id number]) as of 11/15/2018 13:04  Ref. Range 11/14/2018 11:36 11/15/2018 04:40  Glucose Latest Ref Range: 70 - 99 mg/dL 917 (H) 915 (H)   Diabetes history: Note hx. Of diet controlled DM  Outpatient Diabetes medications:None Current orders for Inpatient glycemic control:  None/Prednisone 40 mg bid  Inpatient Diabetes Program Recommendations:    Please consider adding Novolog moderate correction tid with meals and HS while in the hospital.   Thanks,  Beryl Meager, RN, BC-ADM Inpatient Diabetes Coordinator Pager [phone] (8a-5p)

## 2018-11-16 DIAGNOSIS — R739 Hyperglycemia, unspecified: Secondary | ICD-10-CM

## 2018-11-16 LAB — COMPREHENSIVE METABOLIC PANEL
ALT: 18 U/L (ref 0–44)
AST: 20 U/L (ref 15–41)
Albumin: 3.3 g/dL — ABNORMAL LOW (ref 3.5–5.0)
Alkaline Phosphatase: 38 U/L (ref 38–126)
Anion gap: 11 (ref 5–15)
BUN: 21 mg/dL (ref 8–23)
CO2: 28 mmol/L (ref 22–32)
Calcium: 8.4 mg/dL — ABNORMAL LOW (ref 8.9–10.3)
Chloride: 98 mmol/L (ref 98–111)
Creatinine, Ser: 0.76 mg/dL (ref 0.61–1.24)
GFR calc Af Amer: >60 mL/min (ref >60)
GFR calc non Af Amer: >60 mL/min (ref >60)
Glucose, Bld: 208 mg/dL — ABNORMAL HIGH (ref 70–99)
Potassium: 4.3 mmol/L (ref 3.5–5.1)
Sodium: 137 mmol/L (ref 135–145)
Total Bilirubin: 0.6 mg/dL (ref 0.3–1.2)
Total Protein: 6.6 g/dL (ref 6.5–8.1)

## 2018-11-16 LAB — CBC WITH DIFFERENTIAL/PLATELET
Abs Immature Granulocytes: 0.03 10*3/uL (ref 0.00–0.07)
Basophils Absolute: 0 10*3/uL (ref 0.0–0.1)
Basophils Relative: 0 %
Eosinophils Absolute: 0 10*3/uL (ref 0.0–0.5)
Eosinophils Relative: 0 %
HCT: 37.5 % — ABNORMAL LOW (ref 39.0–52.0)
Hemoglobin: 12.7 g/dL — ABNORMAL LOW (ref 13.0–17.0)
Immature Granulocytes: 0 %
Lymphocytes Relative: 7 %
Lymphs Abs: 0.6 10*3/uL — ABNORMAL LOW (ref 0.7–4.0)
MCH: 29.5 pg (ref 26.0–34.0)
MCHC: 33.9 g/dL (ref 30.0–36.0)
MCV: 87.2 fL (ref 80.0–100.0)
Monocytes Absolute: 0.6 10*3/uL (ref 0.1–1.0)
Monocytes Relative: 7 %
Neutro Abs: 6.9 10*3/uL (ref 1.7–7.7)
Neutrophils Relative %: 86 %
Platelets: 166 10*3/uL (ref 150–400)
RBC: 4.3 MIL/uL (ref 4.22–5.81)
RDW: 11.6 % (ref 11.5–15.5)
WBC: 8.1 10*3/uL (ref 4.0–10.5)
nRBC: 0 % (ref 0.0–0.2)

## 2018-11-16 LAB — D-DIMER, QUANTITATIVE: D-Dimer, Quant: 0.42 ug/mL-FEU (ref 0.00–0.50)

## 2018-11-16 LAB — GLUCOSE, CAPILLARY
Glucose-Capillary: 152 mg/dL — ABNORMAL HIGH (ref 70–99)
Glucose-Capillary: 140 mg/dL — ABNORMAL HIGH (ref 70–99)
Glucose-Capillary: 191 mg/dL — ABNORMAL HIGH (ref 70–99)
Glucose-Capillary: 242 mg/dL — ABNORMAL HIGH (ref 70–99)

## 2018-11-16 LAB — FERRITIN: Ferritin: 896 ng/mL — ABNORMAL HIGH (ref 24–336)

## 2018-11-16 LAB — HEMOGLOBIN A1C
Hgb A1c MFr Bld: 7.3 % — ABNORMAL HIGH (ref 4.8–5.6)
Mean Plasma Glucose: 162.81 mg/dL

## 2018-11-16 LAB — C-REACTIVE PROTEIN: CRP: 3.7 mg/dL — ABNORMAL HIGH (ref <1.0)

## 2018-11-16 LAB — MAGNESIUM: Magnesium: 2.2 mg/dL (ref 1.7–2.4)

## 2018-11-16 MED ORDER — GUAIFENESIN 100 MG/5ML PO SOLN
5.0000 mL | ORAL | Status: DC | PRN
  Administered 2018-11-17 (×4): 100 mg via ORAL
  Filled 2018-11-16 (×4): qty 15

## 2018-11-16 NOTE — Progress Notes (Signed)
PROGRESS NOTE  Scott Hester ZHY:[id number] DOB: 1956/07/04 DOA: 11/14/2018  PCP: Steele Sizerrissman, Mark A, MD  Brief History/Interval Summary: 63 y.o. 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  Chest x-ray suggestive of COVID-19 pneumonia, he was thought to have mild exertional hypoxia and he was transferred to [facility] for further care.  Reason for Visit: Acute COVID-19 infection with acute hypoxic respiratory failure  Consultants: None  Procedures: None  Antibiotics: Anti-infectives (From admission, onward)   None       Subjective/Interval History: Patient states that he feels better.  Denies any shortness of breath.  No issues overnight.  No nausea vomiting.  He is ambulating without any difficulties.    Assessment/Plan:  Acute Hypoxic Resp. Failure due to Acute Covid 19 Viral Illness during the ongoing 2020 Covid 19 Pandemic  COVID-19 Labs  Recent Labs    11/14/18 1401 11/14/18 1930 11/15/18 0440 11/16/18 0505  DDIMER  --  0.44 0.41 0.42  FERRITIN 598*  --  801* 896*  CRP 6.3* 7.0* 8.4* 3.7*    Lab Results  Component Value Date   SARSCOV2NAA POSITIVE (A) 11/14/2018     Last episode of fever: Temperature of 100.2 on 5/7 at 3 PM.  Patient's T-max was 101.5 at 11 AM on 5/7. Oxygen requirements: Saturating in the 90s on room air.   Antibiotics: None Steroids: Initially on IV Solu-Medrol and then changed to oral steroids.   Actemra: Not indicated Vitamin C and Zinc: Ordered Diuretics: No indication currently.  Prone positioning: Patient encouraged to stay in prone position as much as possible.  Continue to mobilize.  Patient is ambulating without any difficulties.  Inflammatory  markers and d-dimer as above.  Ferritin is stable for the most part though elevated.  CRP has improved to 3.7 from 8.4.  D-dimer remains normal.  Patient was noted to have lymphopenia.  BNP was normal at 21.9.  Lactic acid level normal at 1.3.  Procalcitonin less than 0.1.   The treatment plan and use of medications and known side effects were discussed with patient/family. It was clearly explained that there is no proven definitive treatment for COVID-19 infection yet. Any medications used here are based on case reports/anecdotal data which are not peer-reviewed and has not been studied using randomized control trials.  Complete risks and long-term side effects are unknown, however in the best clinical judgment they seem to be of some clinical benefit rather than medical risks.  Patient/family agree with the treatment plan and want to receive these treatments as indicated.  Patient has remained stable.  Has not required any experimental therapies at this time.  Essential hypertension Blood pressure is reasonably well controlled.  Continue to monitor.  History of BPH Continue home medications.  Hyperglycemia/newly diagnosed diabetes mellitus type 2 Possibly due to steroids.  Patient with history of diet-controlled diabetes.  HbA1c 7.3.  Patient likely has undiagnosed diabetes.  Will need outpatient monitoring.  As steroid is tapered down his CBG should improve.  Continue SSI.   DVT Prophylaxis:  Lovenox  Lab Results  Component Value Date   PLT 166 11/16/2018     PUD Prophylaxis: Pepcid Code Status: Full code Family Communication:  Discussed with the patient.  Discussed with his wife yesterday. Disposition Plan: Anticipate discharge tomorrow if he remains afebrile.    Medications:  Scheduled: . enoxaparin (LOVENOX) injection  40 mg Subcutaneous Q24H  . famotidine  20 mg Oral Daily  . insulin aspart  0-15 Units Subcutaneous TID WC  . predniSONE  40 mg Oral BID WC  . sodium chloride  flush  3 mL Intravenous Q12H  . vitamin C  500 mg Oral Daily  . zinc sulfate  220 mg Oral Daily   Continuous:  ZOX:[id number], hydrALAZINE, ondansetron **OR** ondansetron (ZOFRAN) IV, polyethylene glycol   Objective:  Vital Signs  Vitals:   11/15/18 1451 11/15/18 2012 11/16/18 0452 11/16/18 0817  BP:  121/65 127/69 114/77  Pulse:  67 65 68  Resp:  Temp: 98.4 F (36.9 C) 97.8 F (36.6 C) 99.1 F (37.3 C) 99.5 F (37.5 C)  TempSrc: Oral Axillary Oral Oral  SpO2:  97% 99% 98%  Weight:      Height:        Intake/Output Summary (Last 24 hours) at 11/16/2018 1002 Last data filed at 11/16/2018 0453 Gross per 24 hour  Intake 120 ml  Output -  Net 120 ml   Filed Weights   11/14/18 1800  Weight: 86.2 kg   General appearance: Awake alert.  In no distress Resp: Normal effort.  Good air entry bilaterally.  No wheezing rales or rhonchi.   Cardio: S1-S2 is normal regular.  No S3-S4.  No rubs murmurs or bruit GI: Abdomen is soft.  Nontender nondistended.  Bowel sounds are present normal.  No masses organomegaly Extremities: No edema.  Full range of motion of lower extremities. Neurologic: Alert and oriented x3.  No focal neurological deficits.     Lab Results:  Data Reviewed: I have personally reviewed following labs and imaging studies  CBC: Recent Labs  Lab 11/14/18 1136 11/14/18 1930 11/15/18 0440 11/16/18 0505  WBC 4.3 3.9* 2.9* 8.1  NEUTROABS 3.5  --  2.4 6.9  HGB 13.1 12.3* 13.5 12.7*  HCT 37.5* 35.3* 39.8 37.5*  MCV 86.0 87.6 87.7 87.2  PLT 126* 115* 128* 166    Basic Metabolic Panel: Recent Labs  Lab 11/14/18 1136 11/14/18 1401 11/14/18 1930 11/15/18 0440 11/16/18 0505  NA 133*  --   --  136 137  K 3.8  --   --  4.3 4.3  CL 96*  --   --  100 98  CO2 26  --   --  28 28  GLUCOSE 238*  --   --  226* 208*  BUN 16  --   --  16 21  CREATININE 0.89  --  0.80 0.77 0.76  CALCIUM 8.0*  --   --  8.4* 8.4*  MG  --  2.2  --  2.3 2.2     GFR: Estimated Creatinine Clearance: 100.1 mL/min (by C-G formula based on SCr of 0.76 mg/dL).  Liver Function Tests: Recent Labs  Lab 11/14/18 1136 11/15/18 0440 11/16/18 0505  AST ALT ALKPHOS 43 41 38  BILITOT 0.8 0.8 0.6  PROT 7.0 7.1 6.6  ALBUMIN 3.7 3.5 3.3*    Anemia Panel: Recent Labs    11/15/18 0440 11/16/18 0505  FERRITIN 801* 896*    Recent Results (from the past 240 hour(s))  Urine culture     Status: None   Collection Time: 11/14/18 11:36 AM  Result Value  Ref Range Status   Specimen Description   Final    URINE, RANDOM Performed at [facility], [address], [place], [place] [address]    Special Requests   Final    Normal Performed at [facility], [address] [place], [place] [address]    Culture   Final    NO GROWTH Performed at [facility], [address] [place], [place] [address]    Report Status 11/15/2018 FINAL  Final  Blood Culture (routine x 2)     Status: None (Preliminary result)   Collection Time: 11/14/18 11:51 AM  Result Value Ref Range Status   Specimen Description BLOOD RIGHT ANTECUBITAL  Final   Special Requests   Final    BOTTLES DRAWN AEROBIC AND ANAEROBIC Blood Culture results may not be optimal due to an excessive volume of blood received in culture bottles   Culture   Final    NO GROWTH 2 DAYS Performed at [facility], [address] [place], [place] [address]    Report Status PENDING  Incomplete  SARS Coronavirus 2 [facility] order, Performed in [facility] hospital lab)     Status: Abnormal   Collection Time: 11/14/18 11:51 AM  Result Value Ref Range Status   SARS Coronavirus 2 POSITIVE (A) NEGATIVE Final    Comment: RESULT CALLED TO, READ BACK BY AND VERIFIED WITH:  KATE BUCKNUM AT 1326 11/14/2018 SDR (NOTE) If result is NEGATIVE SARS-CoV-2 target nucleic acids are NOT DETECTED. The SARS-CoV-2 RNA is generally detectable in upper and lower   respiratory specimens during the acute phase of infection. The lowest  concentration of SARS-CoV-2 viral copies this assay can detect is 250  copies / mL. A negative result does not preclude SARS-CoV-2 infection  and should not be used as the sole basis for treatment or other  patient management decisions.  A negative result may occur with  improper specimen collection / handling, submission of specimen other  than nasopharyngeal swab, presence of viral mutation(s) within the  areas targeted by this assay, and inadequate number of viral copies  (<250 copies / mL). A negative result must be combined with clinical  observations, patient history, and epidemiological information. If result is POSITIVE SARS-CoV-2 target nucleic acids are DETECTED. Th e SARS-CoV-2 RNA is generally detectable in upper and lower  respiratory specimens during the acute phase of infection.  Positive  results are indicative of active infection with SARS-CoV-2.  Clinical  correlation with patient history and other diagnostic information is  necessary to determine patient infection status.  Positive results do  not rule out bacterial infection or co-infection with other viruses. If result is PRESUMPTIVE POSTIVE SARS-CoV-2 nucleic acids MAY BE PRESENT.   A presumptive positive result was obtained on the submitted specimen  and confirmed on repeat testing.  While 2019 novel coronavirus  (SARS-CoV-2) nucleic acids may be present in the submitted sample  additional confirmatory testing may be necessary for epidemiological  and / or clinical management purposes  to differentiate between  SARS-CoV-2 and other Sarbecovirus currently known to infect humans.  If clinically indicated additional testing with an alternate test  methodology [phone]) is  advised. The SARS-CoV-2 RNA is generally  detectable in upper and lower respiratory specimens during the acute  phase of infection. The expected result is Negative. Fact  Sheet for Patients:  BoilerBrush.com.cy Fact Sheet for Healthcare Providers: https://pope.com/ This test is not yet approved or cleared by the [place] FDA and  has been authorized for detection and/or diagnosis of SARS-CoV-2 by FDA under an Emergency Use Authorization (EUA).  This EUA will remain in effect (meaning this test can be used) for the duration of the COVID-19 declaration under Section 564(b)(1) of the Act, 21 U.S.C. section 360bbb-3(b)(1), unless the authorization is terminated or revoked sooner. Performed at [facility], [address] [place], [place] [address]   Blood Culture (routine x 2)     Status: None (Preliminary result)   Collection Time: 11/14/18 11:53 AM  Result Value Ref Range Status   Specimen Description BLOOD LEFT ANTECUBITAL  Final   Special Requests   Final    BOTTLES DRAWN AEROBIC AND ANAEROBIC Blood Culture adequate volume   Culture   Final    NO GROWTH 2 DAYS Performed at [facility], [address], [place], [place] [address]    Report Status PENDING  Incomplete      Radiology Studies: Dg Chest Port 1 View  Result Date: 11/15/2018 CLINICAL DATA:  COVID-19.  Shortness of breath EXAM: PORTABLE CHEST 1 VIEW COMPARISON:  Yesterday FINDINGS: Mild cardiomegaly. Stable mediastinal contours. Mild interstitial coarsening and subtle patchy density. No convincing change from prior. No effusion or pneumothorax. IMPRESSION: Mild pulmonary opacification without definite progression. Electronically Signed   By: Marnee Spring M.D.   On: 11/15/2018 05:23   Dg Chest Port 1 View  Result Date: 11/14/2018 CLINICAL DATA:  Worsening shortness of breath and cough. EXAM: PORTABLE CHEST 1 VIEW COMPARISON:  None. FINDINGS: Heart size upper limits of normal. There appears to be pulmonary vascular prominence without frank edema. Patient has taken a poor inspiration. Question patchy bilateral pulmonary  infiltrates without lobar consolidation, collapse or effusion. No acute bone finding. IMPRESSION: Poor inspiration. Heart size upper limits of normal. Question pulmonary venous hypertension without frank edema. Patchy bilateral pulmonary densities which could relate in part to the poor inspiration or could represent patchy bilateral bronchopneumonia. Consider two-view chest radiography when able. Electronically Signed   By: Paulina Fusi M.D.   On: 11/14/2018 11:54       LOS: 2 days   Shaan Rhoads Rito Ehrlich  [facility] Pager on [email or web address]  11/16/2018, 10:02 AM

## 2018-11-17 LAB — GLUCOSE, CAPILLARY
Glucose-Capillary: 172 mg/dL — ABNORMAL HIGH (ref 70–99)
Glucose-Capillary: 159 mg/dL — ABNORMAL HIGH (ref 70–99)
Glucose-Capillary: 285 mg/dL — ABNORMAL HIGH (ref 70–99)
Glucose-Capillary: 301 mg/dL — ABNORMAL HIGH (ref 70–99)

## 2018-11-17 LAB — CBC WITH DIFFERENTIAL/PLATELET
Abs Immature Granulocytes: 0.09 10*3/uL — ABNORMAL HIGH (ref 0.00–0.07)
Basophils Absolute: 0 10*3/uL (ref 0.0–0.1)
Basophils Relative: 0 %
Eosinophils Absolute: 0 10*3/uL (ref 0.0–0.5)
Eosinophils Relative: 0 %
HCT: 34.9 % — ABNORMAL LOW (ref 39.0–52.0)
Hemoglobin: 11.9 g/dL — ABNORMAL LOW (ref 13.0–17.0)
Immature Granulocytes: 1 %
Lymphocytes Relative: 7 %
Lymphs Abs: 0.5 10*3/uL — ABNORMAL LOW (ref 0.7–4.0)
MCH: 29.5 pg (ref 26.0–34.0)
MCHC: 34.1 g/dL (ref 30.0–36.0)
MCV: 86.6 fL (ref 80.0–100.0)
Monocytes Absolute: 0.5 10*3/uL (ref 0.1–1.0)
Monocytes Relative: 7 %
Neutro Abs: 6.6 10*3/uL (ref 1.7–7.7)
Neutrophils Relative %: 85 %
Platelets: 181 10*3/uL (ref 150–400)
RBC: 4.03 MIL/uL — ABNORMAL LOW (ref 4.22–5.81)
RDW: 11.6 % (ref 11.5–15.5)
WBC: 7.7 10*3/uL (ref 4.0–10.5)
nRBC: 0 % (ref 0.0–0.2)

## 2018-11-17 LAB — COMPREHENSIVE METABOLIC PANEL
ALT: 20 U/L (ref 0–44)
AST: 20 U/L (ref 15–41)
Albumin: 3 g/dL — ABNORMAL LOW (ref 3.5–5.0)
Alkaline Phosphatase: 40 U/L (ref 38–126)
Anion gap: 11 (ref 5–15)
BUN: 18 mg/dL (ref 8–23)
CO2: 26 mmol/L (ref 22–32)
Calcium: 8 mg/dL — ABNORMAL LOW (ref 8.9–10.3)
Chloride: 100 mmol/L (ref 98–111)
Creatinine, Ser: 0.78 mg/dL (ref 0.61–1.24)
GFR calc Af Amer: >60 mL/min (ref >60)
GFR calc non Af Amer: >60 mL/min (ref >60)
Glucose, Bld: 206 mg/dL — ABNORMAL HIGH (ref 70–99)
Potassium: 4 mmol/L (ref 3.5–5.1)
Sodium: 137 mmol/L (ref 135–145)
Total Bilirubin: 0.7 mg/dL (ref 0.3–1.2)
Total Protein: 6.1 g/dL — ABNORMAL LOW (ref 6.5–8.1)

## 2018-11-17 LAB — C-REACTIVE PROTEIN: CRP: 4 mg/dL — ABNORMAL HIGH (ref <1.0)

## 2018-11-17 MED ORDER — LOPERAMIDE HCL 2 MG PO CAPS
4.0000 mg | ORAL_CAPSULE | Freq: Three times a day (TID) | ORAL | Status: DC | PRN
  Administered 2018-11-17: 09:00:00 4 mg via ORAL
  Filled 2018-11-17 (×2): qty 2

## 2018-11-17 NOTE — Plan of Care (Signed)
  Problem: Education: Goal: Knowledge of General Education information will improve Description Including pain rating scale, medication(s)/side effects and non-pharmacologic comfort measures Outcome: Progressing   Problem: Health Behavior/Discharge Planning: Goal: Ability to manage health-related needs will improve Outcome: Progressing   Problem: Clinical Measurements: Goal: Ability to maintain clinical measurements within normal limits will improve Outcome: Progressing Goal: Diagnostic test results will improve Outcome: Progressing Goal: Respiratory complications will improve Outcome: Progressing   Problem: Activity: Goal: Risk for activity intolerance will decrease Outcome: Progressing   Problem: Nutrition: Goal: Adequate nutrition will be maintained Outcome: Progressing   Problem: Pain Managment: Goal: General experience of comfort will improve Outcome: Progressing   Problem: Safety: Goal: Ability to remain free from injury will improve Outcome: Progressing   Problem: Skin Integrity: Goal: Risk for impaired skin integrity will decrease Outcome: Progressing   Problem: Elimination: Goal: Will not experience complications related to bowel motility Outcome: Not Progressing Note:  C/o multiple soft stools, Immodium ordered today

## 2018-11-17 NOTE — Progress Notes (Signed)
PROGRESS NOTE  Scott Hester HAL:[id number] DOB: Dec 01, 1955 DOA: 11/14/2018  PCP: Steele Sizer, MD  Brief History/Interval Summary: 63 y.o. 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  Chest x-ray suggestive of COVID-19 pneumonia, he was thought to have mild exertional hypoxia and he was transferred to [facility] for further care.  Reason for Visit: Acute COVID-19 infection with acute hypoxic respiratory failure  Consultants: None  Procedures: None  Antibiotics: Anti-infectives (From admission, onward)   None       Subjective/Interval History: Patient unfortunately developed fever yesterday morning.  Denies any respiratory difficulties.  He feels well otherwise.  Continues to have a dry cough.    Assessment/Plan:  Acute Hypoxic Resp. Failure due to Acute Covid 19 Viral Illness during the ongoing 2020 Covid 19 Pandemic  COVID-19 Labs  Recent Labs    11/14/18 1401 11/14/18 1930 11/15/18 0440 11/16/18 0505 11/17/18 0400  DDIMER  --  0.44 0.41 0.42  --   FERRITIN 598*  --  801* 896*  --   CRP 6.3* 7.0* 8.4* 3.7* 4.0*    Lab Results  Component Value Date   SARSCOV2NAA POSITIVE (A) 11/14/2018     Last episode of fever: Had a fever of 101.5 F on 5/9 at 11:20 AM.   Oxygen requirements: Saturating in the 90s on room air.   Antibiotics: None Steroids: Initially on IV Solu-Medrol and then changed to oral steroids.   Actemra: Was not given Vitamin C and Zinc: Ordered Diuretics: No indication currently.  Prone positioning: Patient encouraged to stay in prone position as much as possible.    Patient is ambulating without any difficulties.  He was encouraged to do so today as well.     Inflammatory markers and d-dimer as above.  Ferritin is stable for the most part though elevated.  CRP peaked at 8.4.  Noted to be 4.0 today.  D-dimer has been normal.  WBC has been normal.  He was noted to have lymphopenia.    The treatment plan and use of medications and known side effects were discussed with patient/family. It was clearly explained that there is no proven definitive treatment for COVID-19 infection yet. Any medications used here are based on case reports/anecdotal data which are not peer-reviewed and has not been studied using randomized control trials.  Complete risks and long-term side effects are unknown, however in the best clinical judgment they seem to be of some clinical benefit rather than medical risks.  Patient/family agree with the treatment plan and want to receive these treatments as indicated.  Patient has remained stable.  Has not required any experimental therapies at this time.  Essential hypertension Blood pressure is reasonably well controlled.  Continue to monitor.  History of BPH Continue home medications.  Hyperglycemia/newly diagnosed diabetes mellitus type 2 Possibly due to steroids.  Patient with history of diet-controlled diabetes.  HbA1c 7.3.  Patient likely has undiagnosed diabetes.  Will need outpatient monitoring.  As steroid is tapered down his CBG should improve.  Continue SSI.   DVT Prophylaxis:  Lovenox  Lab Results  Component Value Date   PLT 181 11/17/2018     PUD Prophylaxis: Pepcid Code Status: Full  code Family Communication: Discussed with the patient and his wife on a daily basis. Disposition Plan: Patient had fever yesterday.  His respiratory status remains stable.  If there are no further clinical changes he should be able to go home tomorrow.  He will still need to quarantine himself for 14 days. As long as he is going to go back home he does not have to be fever free for 3 days in the hospital.  This was clarified with  infectious disease.      Medications:  Scheduled: . enoxaparin (LOVENOX) injection  40 mg Subcutaneous Q24H  . famotidine  20 mg Oral Daily  . insulin aspart  0-15 Units Subcutaneous TID WC  . predniSONE  40 mg Oral BID WC  . sodium chloride flush  3 mL Intravenous Q12H  . vitamin C  500 mg Oral Daily  . zinc sulfate  220 mg Oral Daily   Continuous:  ZOX:[id number], guaiFENesin, hydrALAZINE, loperamide, ondansetron **OR** ondansetron (ZOFRAN) IV, polyethylene glycol   Objective:  Vital Signs  Vitals:   11/16/18 2024 11/16/18 2356 11/17/18 0408 11/17/18 0900  BP: (!) 146/67 138/70 132/67 139/69  Pulse: 68 73 68 66  Resp: 18  18 (!) 24  Temp: (!) 100.5 F (38.1 C) 100 F (37.8 C) 98.4 F (36.9 C) 99.9 F (37.7 C)  TempSrc: Oral Oral Oral Oral  SpO2: 100% 95% 92% 97%  Weight:      Height:        Intake/Output Summary (Last 24 hours) at 11/17/2018 1016 Last data filed at 11/17/2018 0933 Gross per 24 hour  Intake 600 ml  Output -  Net 600 ml   Filed Weights   11/14/18 1800  Weight: 86.2 kg   General appearance: Awake alert.  In no distress Resp: Clear to auscultation bilaterally.  Normal effort Cardio: S1-S2 is normal regular.  No S3-S4.  No rubs murmurs or bruit GI: Abdomen is soft.  Nontender nondistended.  Bowel sounds are present normal.  No masses organomegaly Extremities: No edema.  Full range of motion of lower extremities. Neurologic: Alert and oriented x3.  No focal neurological deficits.     Lab Results:  Data Reviewed: I have personally reviewed following labs and imaging studies  CBC: Recent Labs  Lab 11/14/18 1136 11/14/18 1930 11/15/18 0440 11/16/18 0505 11/17/18 0400  WBC 4.3 3.9* 2.9* 8.1 7.7  NEUTROABS 3.5  --  2.4 6.9 6.6  HGB 13.1 12.3* 13.5 12.7* 11.9*  HCT 37.5* 35.3* 39.8 37.5* 34.9*  MCV 86.0 87.6 87.7 87.2 86.6  PLT 126* 115* 128* 166 181    Basic Metabolic Panel: Recent Labs  Lab 11/14/18 1136 11/14/18 1401  11/14/18 1930 11/15/18 0440 11/16/18 0505 11/17/18 0400  NA 133*  --   --  136 137 137  K 3.8  --   --  4.3 4.3 4.0  CL 96*  --   --  100 98 100  CO2 26  --   --  GLUCOSE 238*  --   --  226* 208* 206*  BUN 16  --   --  CREATININE 0.89  --  0.80 0.77 0.76 0.78  CALCIUM 8.0*  --   --  8.4* 8.4* 8.0*  MG  --  2.2  --  2.3 2.2  --     GFR: Estimated Creatinine Clearance: 100.1 mL/min (by C-G formula based on SCr of 0.78 mg/dL).  Liver Function Tests: Recent Labs  Lab 11/14/18 1136 11/15/18 0440 11/16/18 0505 11/17/18 0400  AST ALT ALKPHOS 43 41 38 40  BILITOT 0.8 0.8 0.6 0.7  PROT 7.0 7.1 6.6 6.1*  ALBUMIN 3.7 3.5 3.3* 3.0*    Anemia Panel: Recent Labs    11/15/18 0440 11/16/18 0505  FERRITIN 801* 896*    Recent Results (from the past 240 hour(s))  Urine culture     Status: None   Collection Time: 11/14/18 11:36 AM  Result Value Ref Range Status   Specimen Description   Final    URINE, RANDOM Performed at [facility], [address], [place], [place] [address]    Special Requests   Final    Normal Performed at [facility], [address] [place], [place] [address]    Culture   Final    NO GROWTH Performed at [facility], [address] [place], [place] [address]    Report Status 11/15/2018 FINAL  Final  Blood Culture (routine x 2)     Status: None (Preliminary result)   Collection Time: 11/14/18 11:51 AM  Result Value Ref Range Status   Specimen Description BLOOD RIGHT ANTECUBITAL  Final   Special Requests   Final    BOTTLES DRAWN AEROBIC AND ANAEROBIC Blood Culture results may not be optimal due to an excessive volume of blood received in culture bottles   Culture   Final    NO GROWTH 3 DAYS Performed at [facility], [address] [place], [place] [address]    Report Status PENDING  Incomplete  SARS Coronavirus 2 [facility] order, Performed in [facility]  hospital lab)     Status: Abnormal   Collection Time: 11/14/18 11:51 AM  Result Value Ref Range Status   SARS Coronavirus 2 POSITIVE (A) NEGATIVE Final    Comment: RESULT CALLED TO, READ BACK BY AND VERIFIED WITH:  KATE BUCKNUM AT 1326 11/14/2018 SDR (NOTE) If result is NEGATIVE SARS-CoV-2 target nucleic acids are NOT DETECTED. The SARS-CoV-2 RNA is generally detectable in upper and lower  respiratory specimens during the acute phase of infection. The lowest  concentration of SARS-CoV-2 viral copies this assay can detect is 250  copies / mL. A negative result does not preclude SARS-CoV-2 infection  and should not be used as the sole basis for treatment or other  patient management decisions.  A negative result may occur with  improper specimen collection / handling, submission of specimen other  than nasopharyngeal swab, presence of viral mutation(s) within the  areas targeted by this assay, and inadequate number of viral copies  (<250 copies / mL). A negative result must be combined with clinical  observations, patient history, and epidemiological information. If result is POSITIVE SARS-CoV-2 target nucleic acids are DETECTED. Th e SARS-CoV-2 RNA is generally detectable in upper and lower  respiratory specimens during the acute phase of infection.  Positive  results are indicative of active infection with SARS-CoV-2.  Clinical  correlation with patient history and other diagnostic information is  necessary to determine patient infection status.  Positive results do  not rule out bacterial infection or co-infection with other viruses. If result is PRESUMPTIVE POSTIVE SARS-CoV-2 nucleic acids MAY BE PRESENT.   A presumptive positive result was obtained on the submitted specimen  and confirmed on repeat testing.  While 2019 novel coronavirus  (SARS-CoV-2) nucleic acids may be present in the submitted sample  additional confirmatory testing may be  necessary for epidemiological  and / or  clinical management purposes  to differentiate between  SARS-CoV-2 and other Sarbecovirus currently known to infect humans.  If clinically indicated additional testing with an alternate test  methodology [id number]) is  advised. The SARS-CoV-2 RNA is generally  detectable in upper and lower respiratory specimens during the acute  phase of infection. The expected result is Negative. Fact Sheet for Patients:  BoilerBrush.com.cyhttps://www.fda.gov/media/136312/download Fact Sheet for Healthcare Providers: https://pope.com/https://www.fda.gov/media/136313/download This test is not yet approved or cleared by the [place] FDA and has been authorized for detection and/or diagnosis of SARS-CoV-2 by FDA under an Emergency Use Authorization (EUA).  This EUA will remain in effect (meaning this test can be used) for the duration of the COVID-19 declaration under Section 564(b)(1) of the Act, 21 U.S.C. section 360bbb-3(b)(1), unless the authorization is terminated or revoked sooner. Performed at [facility], [address] [place], [place] [address]   Blood Culture (routine x 2)     Status: None (Preliminary result)   Collection Time: 11/14/18 11:53 AM  Result Value Ref Range Status   Specimen Description BLOOD LEFT ANTECUBITAL  Final   Special Requests   Final    BOTTLES DRAWN AEROBIC AND ANAEROBIC Blood Culture adequate volume   Culture   Final    NO GROWTH 3 DAYS Performed at [facility], [address] [place], [place] [address]    Report Status PENDING  Incomplete      Radiology Studies: No results found.     LOS: 3 days   Normand Damron [employer]rishnan  [facility] Pager on [email or web address]  11/17/2018, 10:16 AM

## 2018-11-18 LAB — COMPREHENSIVE METABOLIC PANEL
ALT: 20 U/L (ref 0–44)
AST: 16 U/L (ref 15–41)
Albumin: 3.1 g/dL — ABNORMAL LOW (ref 3.5–5.0)
Alkaline Phosphatase: 36 U/L — ABNORMAL LOW (ref 38–126)
Anion gap: 8 (ref 5–15)
BUN: 19 mg/dL (ref 8–23)
CO2: 29 mmol/L (ref 22–32)
Calcium: 8.3 mg/dL — ABNORMAL LOW (ref 8.9–10.3)
Chloride: 101 mmol/L (ref 98–111)
Creatinine, Ser: 0.78 mg/dL (ref 0.61–1.24)
GFR calc Af Amer: >60 mL/min (ref >60)
GFR calc non Af Amer: >60 mL/min (ref >60)
Glucose, Bld: 231 mg/dL — ABNORMAL HIGH (ref 70–99)
Potassium: 4.4 mmol/L (ref 3.5–5.1)
Sodium: 138 mmol/L (ref 135–145)
Total Bilirubin: 0.8 mg/dL (ref 0.3–1.2)
Total Protein: 6.4 g/dL — ABNORMAL LOW (ref 6.5–8.1)

## 2018-11-18 LAB — GLUCOSE, CAPILLARY
Glucose-Capillary: 222 mg/dL — ABNORMAL HIGH (ref 70–99)
Glucose-Capillary: 198 mg/dL — ABNORMAL HIGH (ref 70–99)
Glucose-Capillary: 301 mg/dL — ABNORMAL HIGH (ref 70–99)

## 2018-11-18 LAB — C-REACTIVE PROTEIN: CRP: 4.1 mg/dL — ABNORMAL HIGH (ref <1.0)

## 2018-11-18 MED ORDER — FAMOTIDINE 20 MG PO TABS: 20.0000 mg | ORAL_TABLET | Freq: Every day | ORAL | 0 refills | Status: DC

## 2018-11-18 MED ORDER — ZINC SULFATE 220 (50 ZN) MG PO CAPS: 220.0000 mg | ORAL_CAPSULE | Freq: Every day | ORAL | 0 refills | Status: DC

## 2018-11-18 MED ORDER — PREDNISONE 20 MG PO TABS: ORAL_TABLET | ORAL | 0 refills | Status: DC

## 2018-11-18 MED ORDER — LOPERAMIDE HCL 2 MG PO CAPS: 4.0000 mg | ORAL_CAPSULE | Freq: Three times a day (TID) | ORAL | 0 refills | Status: DC | PRN

## 2018-11-18 MED ORDER — ASCORBIC ACID 500 MG PO TABS: 500.0000 mg | ORAL_TABLET | Freq: Every day | ORAL | 0 refills | Status: AC

## 2018-11-18 MED ORDER — ZINC SULFATE 220 (50 ZN) MG PO CAPS: 220.0000 mg | ORAL_CAPSULE | Freq: Every day | ORAL | 0 refills | Status: AC

## 2018-11-18 MED ORDER — ASCORBIC ACID 500 MG PO TABS: 500.0000 mg | ORAL_TABLET | Freq: Every day | ORAL | 0 refills | Status: DC

## 2018-11-18 NOTE — Progress Notes (Signed)
Inpatient Diabetes Program Recommendations  AACE/ADA: New Consensus Statement on Inpatient Glycemic Control (2015)  Target Ranges:  Prepandial:   less than 140 mg/dL      Peak postprandial:   less than 180 mg/dL (1-2 hours)      Critically ill patients:  140 - 180 mg/dL   Lab Results  Component Value Date   GLUCAP 198 (H) 11/18/2018   HGBA1C 7.3 (H) 11/16/2018    Review of Glycemic Control Results for Scott Hester, Scott Hester (MRN [id number]) as of 11/18/2018 10:08  Ref. Range 11/17/2018 07:45 11/17/2018 11:49 11/17/2018 17:47 11/17/2018 20:57 11/18/2018 07:38  Glucose-Capillary Latest Ref Range: 70 - 99 mg/dL 409 (H) 811 (H) 914 (H) 301 (H) 198 (H)   Diabetes history: DM 2, diet controlled  Current orders for Inpatient glycemic control: Novolog 0-15 units tid  Inpatient Diabetes Program Recommendations:    Patient on PO prednisone 40 mg bid. Postprandial glucose trends increase up to 301 mg/dl. Please consider adding Novolog 5 units tid meal coverage if patient consumes at least 50% of meals and glucose is at least 80.  Thanks,  Christena Deem RN, MSN, BC-ADM Inpatient Diabetes Coordinator Team Pager [phone] (8a-5p)

## 2018-11-18 NOTE — Discharge Summary (Signed)
[facility]  Physician Discharge Summary   Patient ID: Scott Hester MRN: [id number] DOB/AGE: Jul 27, 1955 63 y.o.  Admit date: 11/14/2018 Discharge date: 11/18/2018  PCP: Steele Sizer, MD  DISCHARGE DIAGNOSES:  Pneumonia secondary to COVID-19, improved Essential hypertension History of BPH Hyperglycemia/newly diagnosed diabetes mellitus  RECOMMENDATIONS FOR OUTPATIENT FOLLOW UP: 1. Patient with elevated HbA1c, likely new onset diabetes mellitus.  Will need outpatient evaluation.   Home Health: None Equipment/Devices: None  CODE STATUS: Full code  DISCHARGE CONDITION: fair  Diet recommendation: Modified carbohydrate  INITIAL HISTORY: 63 y.o.male,with past medical history of hypertension, BPH who works at [employer] as a [employer] and started feeling sick about 5 to 7 days ago when he developed a dry cough, dull back pain and fatigue, he says he might have had subjective fevers, went to seek help at the [facility] ER today where he was diagnosed with COVID-19 infection. Chest x-ray suggestive of COVID-19 pneumonia, he was thought to have mild exertional hypoxia and he was transferred to [facility] for further care.   HOSPITAL COURSE:   Acute Hypoxic Resp. Failure due to Acute Covid 19 Viral Illness during the ongoing 2020 Covid 19 Pandemic  COVID-19 Labs  RecentLabs(last2labs)         Recent Labs    11/14/18 1401 11/14/18 1930 11/15/18 0440 11/16/18 0505 11/17/18 0400  DDIMER  --  0.44 0.41 0.42  --   FERRITIN 598*  --  801* 896*  --   CRP 6.3* 7.0* 8.4* 3.7* 4.0*      Patient was hospitalized.  Initially placed on oxygen.  He had recurrence of fever during the course of this hospitalization.  However since he was quickly weaned off of oxygen.  He has been saturating normal on room air for the last many days.  Last episode of fever was on 5/9.  Patient told to continue to quarantine himself at home for at least 14 days.  Patient did not  require Actemra during this hospitalization.  He was given a course of steroids.  Vitamin C and zinc was also prescribed.  He was kept in prone position.  Inflammatory markers as above.    Essential hypertension Blood pressure is reasonably well controlled.    Continue home medications  History of BPH Continue home medications.  Hyperglycemia/newly diagnosed diabetes mellitus type 2 Possibly due to steroids.  Patient with history of diet-controlled diabetes.  HbA1c 7.3.  Patient likely has undiagnosed diabetes.  Will need outpatient monitoring.  As steroid is tapered down his CBG should improve.  Overall stable.  Okay for discharge home today.   PERTINENT LABS:  The results of significant diagnostics from this hospitalization (including imaging, microbiology, ancillary and laboratory) are listed below for reference.    Microbiology: Recent Results (from the past 240 hour(s))  Urine culture     Status: None   Collection Time: 11/14/18 11:36 AM  Result Value Ref Range Status   Specimen Description   Final    URINE, RANDOM Performed at [facility], [address], [place], [place] [address]    Special Requests   Final    Normal Performed at [facility], [address] [place], [place] [address]    Culture   Final    NO GROWTH Performed at [facility], [address] [place], [place] [address]    Report Status 11/15/2018 FINAL  Final  Blood Culture (routine x 2)     Status: None (Preliminary result)  Collection Time: 11/14/18 11:51 AM  Result Value Ref Range Status   Specimen Description BLOOD RIGHT ANTECUBITAL  Final   Special Requests   Final    BOTTLES DRAWN AEROBIC AND ANAEROBIC Blood Culture results may not be optimal due to an excessive volume of blood received in culture bottles   Culture   Final    NO GROWTH 4 DAYS Performed at [facility], [address] [place], [place] [address]    Report Status PENDING  Incomplete   SARS Coronavirus 2 [facility] order, Performed in [facility] hospital lab)     Status: Abnormal   Collection Time: 11/14/18 11:51 AM  Result Value Ref Range Status   SARS Coronavirus 2 POSITIVE (A) NEGATIVE Final    Comment: RESULT CALLED TO, READ BACK BY AND VERIFIED WITH:  KATE BUCKNUM AT 1326 11/14/2018 SDR (NOTE) If result is NEGATIVE SARS-CoV-2 target nucleic acids are NOT DETECTED. The SARS-CoV-2 RNA is generally detectable in upper and lower  respiratory specimens during the acute phase of infection. The lowest  concentration of SARS-CoV-2 viral copies this assay can detect is 250  copies / mL. A negative result does not preclude SARS-CoV-2 infection  and should not be used as the sole basis for treatment or other  patient management decisions.  A negative result may occur with  improper specimen collection / handling, submission of specimen other  than nasopharyngeal swab, presence of viral mutation(s) within the  areas targeted by this assay, and inadequate number of viral copies  (<250 copies / mL). A negative result must be combined with clinical  observations, patient history, and epidemiological information. If result is POSITIVE SARS-CoV-2 target nucleic acids are DETECTED. Th e SARS-CoV-2 RNA is generally detectable in upper and lower  respiratory specimens during the acute phase of infection.  Positive  results are indicative of active infection with SARS-CoV-2.  Clinical  correlation with patient history and other diagnostic information is  necessary to determine patient infection status.  Positive results do  not rule out bacterial infection or co-infection with other viruses. If result is PRESUMPTIVE POSTIVE SARS-CoV-2 nucleic acids MAY BE PRESENT.   A presumptive positive result was obtained on the submitted specimen  and confirmed on repeat testing.  While 2019 novel coronavirus  (SARS-CoV-2) nucleic acids may be present in the submitted sample  additional  confirmatory testing may be necessary for epidemiological  and / or clinical management purposes  to differentiate between  SARS-CoV-2 and other Sarbecovirus currently known to infect humans.  If clinically indicated additional testing with an alternate test  methodology [phone]) is  advised. The SARS-CoV-2 RNA is generally  detectable in upper and lower respiratory specimens during the acute  phase of infection. The expected result is Negative. Fact Sheet for Patients:  BoilerBrush.com.cy Fact Sheet for Healthcare Providers: https://pope.com/ This test is not yet approved or cleared by the [place] FDA and has been authorized for detection and/or diagnosis of SARS-CoV-2 by FDA under an Emergency Use Authorization (EUA).  This EUA will remain in effect (meaning this test can be used) for the duration of the COVID-19 declaration under Section 564(b)(1) of the Act, 21 U.S.C. section 360bbb-3(b)(1), unless the authorization is terminated or revoked sooner. Performed at [facility], [address] [place], [place] [address]   Blood Culture (routine x 2)     Status: None (Preliminary result)   Collection Time: 11/14/18 11:53 AM  Result Value Ref Range Status   Specimen Description BLOOD LEFT  ANTECUBITAL  Final   Special Requests   Final    BOTTLES DRAWN AEROBIC AND ANAEROBIC Blood Culture adequate volume   Culture   Final    NO GROWTH 4 DAYS Performed at [facility], [address] [place], [place] [address]    Report Status PENDING  Incomplete     Labs: Basic Metabolic Panel: Recent Labs  Lab 11/14/18 1136 11/14/18 1401 11/14/18 1930 11/15/18 0440 11/16/18 0505 11/17/18 0400 11/18/18 0350  NA 133*  --   --  136 137 137 138  K 3.8  --   --  4.3 4.3 4.0 4.4  CL 96*  --   --  100 98 100 101  CO2 26  --   --  28 28 26 29   GLUCOSE 238*  --   --  226* 208* 206* 231*  BUN 16  --   --  16 21 18 19    CREATININE 0.89  --  0.80 0.77 0.76 0.78 0.78  CALCIUM 8.0*  --   --  8.4* 8.4* 8.0* 8.3*  MG  --  2.2  --  2.3 2.2  --   --    Liver Function Tests: Recent Labs  Lab 11/14/18 1136 11/15/18 0440 11/16/18 0505 11/17/18 0400 11/18/18 0350  AST 21 20 20 20 16   ALT 18 18 18 20 20   ALKPHOS 43 41 38 40 36*  BILITOT 0.8 0.8 0.6 0.7 0.8  PROT 7.0 7.1 6.6 6.1* 6.4*  ALBUMIN 3.7 3.5 3.3* 3.0* 3.1*   CBC: Recent Labs  Lab 11/14/18 1136 11/14/18 1930 11/15/18 0440 11/16/18 0505 11/17/18 0400  WBC 4.3 3.9* 2.9* 8.1 7.7  NEUTROABS 3.5  --  2.4 6.9 6.6  HGB 13.1 12.3* 13.5 12.7* 11.9*  HCT 37.5* 35.3* 39.8 37.5* 34.9*  MCV 86.0 87.6 87.7 87.2 86.6  PLT 126* 115* 128* 166 181   BNP: BNP (last 3 results) Recent Labs    11/14/18 1930  BNP 21.9   CBG: Recent Labs  Lab 11/17/18 1712 11/17/18 1747 11/17/18 2057 11/18/18 0738 11/18/18 1131  GLUCAP 222* 285* 301* 198* 301*     IMAGING STUDIES Dg Chest Port 1 View  Result Date: 11/15/2018 CLINICAL DATA:  COVID-19.  Shortness of breath EXAM: PORTABLE CHEST 1 VIEW COMPARISON:  Yesterday FINDINGS: Mild cardiomegaly. Stable mediastinal contours. Mild interstitial coarsening and subtle patchy density. No convincing change from prior. No effusion or pneumothorax. IMPRESSION: Mild pulmonary opacification without definite progression. Electronically Signed   By: Marnee SpringJonathon  Watts M.D.   On: 11/15/2018 05:23   Dg Chest Port 1 View  Result Date: 11/14/2018 CLINICAL DATA:  Worsening shortness of breath and cough. EXAM: PORTABLE CHEST 1 VIEW COMPARISON:  None. FINDINGS: Heart size upper limits of normal. There appears to be pulmonary vascular prominence without frank edema. Patient has taken a poor inspiration. Question patchy bilateral pulmonary infiltrates without lobar consolidation, collapse or effusion. No acute bone finding. IMPRESSION: Poor inspiration. Heart size upper limits of normal. Question pulmonary venous hypertension without  frank edema. Patchy bilateral pulmonary densities which could relate in part to the poor inspiration or could represent patchy bilateral bronchopneumonia. Consider two-view chest radiography when able. Electronically Signed   By: Paulina FusiMark  Shogry M.D.   On: 11/14/2018 11:54    DISCHARGE EXAMINATION: Vitals:   11/17/18 1613 11/17/18 2023 11/18/18 0445 11/18/18 0828  BP:  133/67 137/74   Pulse:  63 60   Resp:    18  Temp: 98.9 F (37.2 C)  99.1 F (37.3 C) 99.5 F (37.5 C) 98 F (36.7 C)  TempSrc: Oral Oral Oral Axillary  SpO2:  99% 92% 92%  Weight:      Height:       General appearance: Awake alert.  In no distress Resp: Clear to auscultation bilaterally.  Normal effort Cardio: S1-S2 is normal regular.  No S3-S4.  No rubs murmurs or bruit GI: Abdomen is soft.  Nontender nondistended.  Bowel sounds are present normal.  No masses organomegaly Extremities: No edema.  Full range of motion of lower extremities. Neurologic: Alert and oriented x3.  No focal neurological deficits.    DISPOSITION: Home  Discharge Instructions    Call MD for:  difficulty breathing, headache or visual disturbances   Complete by:  As directed    Call MD for:  extreme fatigue   Complete by:  As directed    Call MD for:  persistant dizziness or light-headedness   Complete by:  As directed    Call MD for:  persistant nausea and vomiting   Complete by:  As directed    Call MD for:  severe uncontrolled pain   Complete by:  As directed    Call MD for:  temperature >100.4   Complete by:  As directed    Diet - low sodium heart healthy   Complete by:  As directed    Discharge instructions   Complete by:  As directed    Please follow up with your PCP as you were found to have high glucose levels in the hospital. This could have been due to steroids but it is also likely that you have diabetes. And so needs further evaluation.  COVID 19 INSTRUCTIONS You are being discharged from the hospital after treatment for  covid-19 infection.  - You are felt to be stable enough to no longer require inpatient monitoring, testing, and treatment, though you will need to follow the recommendations below: - Based on the CDC's non-test criteria for ending self-isolation: You may not return to work/leave the home until at least 14 days since symptom onset AND 3 days without a fever (without taking tylenol, ibuprofen, etc.) AND have improvement in respiratory symptoms. - Do not take NSAID medications (including, but not limited to, ibuprofen, advil, motrin, naproxen, aleve, goody's powder, etc.) - Follow up with your doctor in the next week via telehealth or seek medical attention right away if your symptoms get WORSE.  - Consider donating plasma after you have recovered (either 14 days after a negative test or 28 days after symptoms have completely resolved) because your antibodies to this virus may be helpful to give to others with life-threatening infections. Please go to the website www.[email or web address] if you would like to consider volunteering for plasma donation.    Directions for you at home:  Wear a facemask You should wear a facemask that covers your nose and mouth when you are in the same room with other people and when you visit a healthcare provider. People who live with or visit you should also wear a facemask while they are in the same room with you.  Separate yourself from other people in your home As much as possible, you should stay in a different room from other people in your home. Also, you should use a separate bathroom, if available.  Avoid sharing household items You should not share dishes, drinking glasses, cups, eating utensils, towels, bedding, or other items with other people in your home. After using these items,  you should wash them thoroughly with soap and water.  Cover your coughs and sneezes Cover your mouth and nose with a tissue when you cough or sneeze, or you can cough or sneeze into  your sleeve. Throw used tissues in a lined trash can, and immediately wash your hands with soap and water for at least 20 seconds or use an alcohol-based hand rub.  Wash your [employer] your hands often and thoroughly with soap and water for at least 20 seconds. You can use an alcohol-based hand sanitizer if soap and water are not available and if your hands are not visibly dirty. Avoid touching your eyes, nose, and mouth with unwashed hands.  Directions for those who live with, or provide care at home for you:  Limit the number of people who have contact with the patient If possible, have only one caregiver for the patient. Other household members should stay in another home or place of residence. If this is not possible, they should stay in another room, or be separated from the patient as much as possible. Use a separate bathroom, if available. Restrict visitors who do not have an essential need to be in the home.  Ensure good ventilation Make sure that shared spaces in the home have good air flow, such as from an air conditioner or an opened window, weather permitting.  Wash your hands often Wash your hands often and thoroughly with soap and water for at least 20 seconds. You can use an alcohol based hand sanitizer if soap and water are not available and if your hands are not visibly dirty. Avoid touching your eyes, nose, and mouth with unwashed hands. Use disposable paper towels to dry your hands. If not available, use dedicated cloth towels and replace them when they become wet.  Wear a facemask and gloves Wear a disposable facemask at all times in the room and gloves when you touch or have contact with the patient's blood, body fluids, and/or secretions or excretions, such as sweat, saliva, sputum, nasal mucus, vomit, urine, or feces.  Ensure the mask fits over your nose and mouth tightly, and do not touch it during use. Throw out disposable facemasks and gloves after using them.  Do not reuse. Wash your hands immediately after removing your facemask and gloves. If your personal clothing becomes contaminated, carefully remove clothing and launder. Wash your hands after handling contaminated clothing. Place all used disposable facemasks, gloves, and other waste in a lined container before disposing them with other household waste. Remove gloves and wash your hands immediately after handling these items.  Do not share dishes, glasses, or other household items with the patient Avoid sharing household items. You should not share dishes, drinking glasses, cups, eating utensils, towels, bedding, or other items with a patient who is confirmed to have, or being evaluated for, COVID-19 infection. After the person uses these items, you should wash them thoroughly with soap and water.  Wash laundry thoroughly Immediately remove and wash clothes or bedding that have blood, body fluids, and/or secretions or excretions, such as sweat, saliva, sputum, nasal mucus, vomit, urine, or feces, on them. Wear gloves when handling laundry from the patient. Read and follow directions on labels of laundry or clothing items and detergent. In general, wash and dry with the warmest temperatures recommended on the label.  Clean all areas the individual has used often Clean all touchable surfaces, such as counters, tabletops, doorknobs, bathroom fixtures, toilets, phones, keyboards, tablets, and bedside tables, every day.  Also, clean any surfaces that may have blood, body fluids, and/or secretions or excretions on them. Wear gloves when cleaning surfaces the patient has come in contact with. Use a diluted bleach solution (e.g., dilute bleach with 1 part bleach and 10 parts water) or a household disinfectant with a label that says EPA-registered for coronaviruses. To make a bleach solution at home, add 1 tablespoon of bleach to 1 quart (4 cups) of water. For a larger supply, add  cup of bleach to 1 gallon  (16 cups) of water. Read labels of cleaning products and follow recommendations provided on product labels. Labels contain instructions for safe and effective use of the cleaning product including precautions you should take when applying the product, such as wearing gloves or eye protection and making sure you have good ventilation during use of the product. Remove gloves and wash hands immediately after cleaning.  Monitor yourself for signs and symptoms of illness Caregivers and household members are considered close contacts, should monitor their health, and will be asked to limit movement outside of the home to the extent possible. Follow the monitoring steps for close contacts listed on the symptom monitoring form.   If you have additional questions, contact your local health department or call the epidemiologist on call at [phone] (available 24/7). This guidance is subject to change. For the most up-to-date guidance from [facility], please refer to their website: [email or web address].hu   You were cared for by a hospitalist during your hospital stay. If you have any questions about your discharge medications or the care you received while you were in the hospital after you are discharged, you can call the unit and asked to speak with the hospitalist on call if the hospitalist that took care of you is not available. Once you are discharged, your primary care physician will handle any further medical issues. Please note that NO REFILLS for any discharge medications will be authorized once you are discharged, as it is imperative that you return to your primary care physician (or establish a relationship with a primary care physician if you do not have one) for your aftercare needs so that they can reassess your need for medications and monitor your lab values. If you do not have a primary care physician, you can call [phone] for a physician  referral.   Increase activity slowly   Complete by:  As directed    MyChart COVID-19 home monitoring program   Complete by:  Nov 18, 2018    Is the patient willing to use the MyChart Mobile App for home monitoring?:  Yes   Temperature monitoring   Complete by:  Nov 18, 2018    After how many days would you like to receive a notification of this patient's flowsheet entries?:  1        Allergies as of 11/18/2018   No Known Allergies     Medication List    TAKE these medications   amLODipine 5 MG tablet Commonly known as:  NORVASC Take 1 tablet (5 mg total) by mouth daily.   ascorbic acid 500 MG tablet Commonly known as:  VITAMIN C Take 1 tablet (500 mg total) by mouth daily for 14 days. Start taking on:  Nov 19, 2018   benazepril 40 MG tablet Commonly known as:  LOTENSIN Take 1 tablet (40 mg total) by mouth daily.   famotidine 20 MG tablet Commonly known as:  PEPCID Take 1 tablet (20 mg total) by mouth daily for 14 days. Start  taking on:  Nov 19, 2018   finasteride 5 MG tablet Commonly known as:  PROSCAR Take 1 tablet (5 mg total) by mouth daily.   loperamide 2 MG capsule Commonly known as:  IMODIUM Take 2 capsules (4 mg total) by mouth 3 (three) times daily as needed for diarrhea or loose stools.   predniSONE 20 MG tablet Commonly known as:  DELTASONE Take 2 tablets once daily for 4 days, then take 1 tablet once daily for 4 days, then STOP   tamsulosin 0.4 MG Caps capsule Commonly known as:  FLOMAX Take 1 capsule (0.4 mg total) by mouth daily.   zinc sulfate 220 (50 Zn) MG capsule Take 1 capsule (220 mg total) by mouth daily for 14 days. Start taking on:  Nov 19, 2018        Follow-up Information    Crissman, Redge Gainer, MD. Schedule an appointment as soon as possible for a visit in 3 day(s).   Specialty:  Family Medicine Contact information: [address] [place] [address] [phone]           TOTAL DISCHARGE TIME: 35 minutes  Mendel Binsfeld  Rito Ehrlich  [facility] Pager on [email or web address]  11/18/2018, 2:14 PM

## 2018-11-18 NOTE — TOC Transition Note (Signed)
Transition of Care [facility]) - CM/SW Discharge Note   Patient Details  Name: Scott Hester MRN: [id number] Date of Birth: August 09, 1955  Transition of Care [facility] CM/SW Contact:  Colleen Can RN, BSN, NCM-BC, ACM-RN [phone] Phone Number: 11/18/2018, 10:59 AM   Clinical Narrative:    CM following for dispositional needs. CM informed by the charge nurse that the patient will need assistance with transportation. CM spoke to the patient via phone to discuss the POC. Patient stated that he lives at home with his spouse, who is currently home on quarantine restrictions per her job, and unable to leave home to assist patient with transportation home. Patient confirmed he has active health insurance with [employer], with the information provided; Demographics/PCP verified. Patient denied any difficulty obtaining his medications or any additional needs. CM will arrange PTAR as requested and coordinate transport time for 1230. No further needs from CM.    Final next level of care: Home/Self Care Barriers to Discharge: No Barriers Identified   Patient Goals and CMS Choice Patient states their goals for this hospitalization and ongoing recovery are:: "I'm just ready to get home; I feel better" CMS Medicare.gov Compare Post Acute Care list provided to:: (N/A) Choice offered to / list presented to : NA  Discharge Placement Home with self care via PTAR                     Discharge Plan and Services         DME Arranged: N/A DME Agency: NA HH Arranged: NA HH Agency: NA  Social Determinants of Health (SDOH) Interventions     Readmission Risk Interventions No flowsheet data found.

## 2018-11-19 LAB — CULTURE, BLOOD (ROUTINE X 2)
Culture: NO GROWTH
Culture: NO GROWTH
Special Requests: ADEQUATE

## 2018-11-20 ENCOUNTER — Telehealth: Payer: Self-pay

## 2018-11-20 NOTE — Telephone Encounter (Signed)
Transition Care Management Follow-up Telephone Call  Date of discharge and from where: 11/18/2018 from [facility]  How have you been since you were released from the hospital? "I have sores and blisters in my mouth.feels gritty in my mouth and throat"  Any questions or concerns? Yes  "sores in my mouth"  Items Reviewed:  Did the pt receive and understand the discharge instructions provided? Yes   Medications obtained and verified? Yes   Any new allergies since your discharge? No   Dietary orders reviewed? Yes  Do you have support at home? Yes   Functional Questionnaire: (I = Independent and D = Dependent) ADLs:   Bathing/Dressing-  i  Meal Prep- i  Eating- i  Maintaining continence- i  Transferring/Ambulation- i  Managing Meds- i  Follow up appointments reviewed:   PCP Hospital f/u appt confirmed? Yes  Scheduled to see Dr.Crissman on 11/21/2018 @ 10:30am.   Specialist Hospital f/u appt confirmed? No    Are transportation arrangements needed? no  If their condition worsens, is the pt aware to call PCP or go to the Emergency Dept.? Yes  Was the patient provided with contact information for the PCP's office or ED? Yes  Was to pt encouraged to call back with questions or concerns? Yes

## 2018-11-20 NOTE — Telephone Encounter (Signed)
I have made the 1st attempt to contact the patient or family member in charge, in order to follow up from recently being discharged from the hospital. 

## 2018-11-20 NOTE — Telephone Encounter (Signed)
Pt returning call to office. Please call pt °

## 2018-11-21 ENCOUNTER — Ambulatory Visit (INDEPENDENT_AMBULATORY_CARE_PROVIDER_SITE_OTHER): Payer: BLUE CROSS/BLUE SHIELD | Admitting: Family Medicine

## 2018-11-21 ENCOUNTER — Other Ambulatory Visit: Payer: Self-pay

## 2018-11-21 ENCOUNTER — Encounter: Payer: Self-pay | Admitting: Family Medicine

## 2018-11-21 ENCOUNTER — Inpatient Hospital Stay: Payer: BLUE CROSS/BLUE SHIELD | Admitting: Family Medicine

## 2018-11-21 DIAGNOSIS — U071 COVID-19: Secondary | ICD-10-CM

## 2018-11-21 DIAGNOSIS — E1169 Type 2 diabetes mellitus with other specified complication: Secondary | ICD-10-CM

## 2018-11-21 DIAGNOSIS — I10 Essential (primary) hypertension: Secondary | ICD-10-CM | POA: Diagnosis not present

## 2018-11-21 MED ORDER — HYDROCOD POLST-CPM POLST ER 10-8 MG/5ML PO SUER: 2.5000 mL | Freq: Two times a day (BID) | ORAL | 0 refills | Status: DC | PRN

## 2018-11-21 NOTE — Assessment & Plan Note (Signed)
Diabetes stable on outpatient basis will observe

## 2018-11-21 NOTE — Progress Notes (Signed)
There were no vitals taken for this visit.   Subjective:    Patient ID: Scott Hester, male    DOB: [birth year], 63 y.o.   MRN: [id number]  Hospital F/U  HPI: Scott Hester is a 63 y.o. male  COVID 8719  Transition of Care Hospital Follow up.   Hospital/Facility:[facility]  D/C Physician: Rito EhrlichKrishnan D/C Date: 11-18-18  Records Requested: na Records Received: na Records Reviewed: today  Diagnoses on Discharge: Covid 19  Date of interactive Contact within 48 hours of discharge: 11-20-18 Contact was through: phone  Date of 7 day or 14 day face-to-face visit:    Today 11-21-18  Outpatient Encounter Medications as of 11/21/2018  Medication Sig  . acetaminophen (TYLENOL) 325 MG tablet Take 650 mg by mouth every 6 (six) hours as needed.  Marland Kitchen. amLODipine (NORVASC) 5 MG tablet Take 1 tablet (5 mg total) by mouth daily.  Marland Kitchen. ascorbic acid (VITAMIN C) 500 MG tablet Take 1 tablet (500 mg total) by mouth daily for 14 days.  . benazepril (LOTENSIN) 40 MG tablet Take 1 tablet (40 mg total) by mouth daily.  . chlorpheniramine-HYDROcodone (TUSSIONEX PENNKINETIC ER) 10-8 MG/5ML SUER Take 2.5-5 mLs by mouth every 12 (twelve) hours as needed for cough.  . famotidine (PEPCID) 20 MG tablet Take 1 tablet (20 mg total) by mouth daily for 14 days.  . finasteride (PROSCAR) 5 MG tablet Take 1 tablet (5 mg total) by mouth daily.  Marland Kitchen. guaifenesin (ROBITUSSIN) 100 MG/5ML syrup Take 200 mg by mouth 3 (three) times daily as needed for cough.  . loperamide (IMODIUM) 2 MG capsule Take 2 capsules (4 mg total) by mouth 3 (three) times daily as needed for diarrhea or loose stools.  . predniSONE (DELTASONE) 20 MG tablet Take 2 tablets once daily for 4 days, then take 1 tablet once daily for 4 days, then STOP  . tamsulosin (FLOMAX) 0.4 MG CAPS capsule Take 1 capsule (0.4 mg total) by mouth daily.  Marland Kitchen. zinc sulfate 220 (50 Zn) MG capsule Take 1 capsule (220 mg total) by mouth daily for 14 days.   No facility-administered  encounter medications on file as of 11/21/2018.     Diagnostic Tests Reviewed/Disposition: done  Consults:na  Discharge Instructions: reviewed with pt  Disease/illness Education:done  Home Health/Community Services Discussions/Referrals:na  Establishment or re-establishment of referral orders for community resources:na  Discussion with other health care providers:na  Assessment and Support of treatment regimen adherence:doen  Appointments Coordinated with: na  Education for self-management, independent living, and ADLs: done Telemedicine using audio/video telecommunications for a synchronous communication visit. Today's visit due to COVID-19 isolation precautions I connected with and verified that I am speaking with the correct person using two identifiers.   I discussed the limitations, risks, security and privacy concerns of performing an evaluation and management service by telecommunication and the availability of in person appointments. I also discussed with the patient that there may be a patient responsible charge related to this service. The patient expressed understanding and agreed to proceed. The patient's location is home I am at home.  Discussed with patient has recovered from breathing issues still short of breath but doing okay all in all gets very tired easily. Patient's biggest concern is some mouth sores and discomfort in his mouth.  Patient is doing salt water rinses which seems to help. Blood pressure during hospitalization did well and is also doing well on discharge. Diabetes blood sugar went up got some insulin during hospitalization but is  doing well now with no further issues.   Relevant past medical, surgical, family and social history reviewed and updated as indicated. Interim medical history since our last visit reviewed. Allergies and medications reviewed and updated.   Review of Systems  Constitutional: Positive for appetite change, chills,  diaphoresis, fatigue and fever.  Respiratory: Positive for cough. Negative for choking, chest tightness and shortness of breath.   Cardiovascular: Negative.     Per HPI unless specifically indicated above     Objective:    There were no vitals taken for this visit.  Wt Readings from Last 3 Encounters:  11/14/18 189 lb 15.9 oz (86.2 kg)  11/14/18 190 lb (86.2 kg)  05/08/18 197 lb 12.8 oz (89.7 kg)    Physical Exam  Results for orders placed or performed during the hospital encounter of 11/14/18  HIV antibody (Routine Testing)  Result Value Ref Range   HIV Screen 4th Generation wRfx Non Reactive Non Reactive  CBC  Result Value Ref Range   WBC 3.9 (L) 4.0 - 10.5 K/uL   RBC 4.03 (L) 4.22 - 5.81 MIL/uL   Hemoglobin 12.3 (L) 13.0 - 17.0 g/dL   HCT 16.1 (L) 09.6 - 04.5 %   MCV 87.6 80.0 - 100.0 fL   MCH 30.5 26.0 - 34.0 pg   MCHC 34.8 30.0 - 36.0 g/dL   RDW 40.9 81.1 - 91.4 %   Platelets 115 (L) 150 - 400 K/uL   nRBC 0.0 0.0 - 0.2 %  Creatinine, serum  Result Value Ref Range   Creatinine, Ser 0.80 0.61 - 1.24 mg/dL   GFR calc non Af Amer >60 >60 mL/min   GFR calc Af Amer >60 >60 mL/min  CBC with Differential/Platelet  Result Value Ref Range   WBC 2.9 (L) 4.0 - 10.5 K/uL   RBC 4.54 4.22 - 5.81 MIL/uL   Hemoglobin 13.5 13.0 - 17.0 g/dL   HCT 78.2 95.6 - 21.3 %   MCV 87.7 80.0 - 100.0 fL   MCH 29.7 26.0 - 34.0 pg   MCHC 33.9 30.0 - 36.0 g/dL   RDW 08.6 57.8 - 46.9 %   Platelets 128 (L) 150 - 400 K/uL   nRBC 0.0 0.0 - 0.2 %   Neutrophils Relative % 80 %   Neutro Abs 2.4 1.7 - 7.7 K/uL   Lymphocytes Relative 15 %   Lymphs Abs 0.4 (L) 0.7 - 4.0 K/uL   Monocytes Relative 4 %   Monocytes Absolute 0.1 0.1 - 1.0 K/uL   Eosinophils Relative 0 %   Eosinophils Absolute 0.0 0.0 - 0.5 K/uL   Basophils Relative 0 %   Basophils Absolute 0.0 0.0 - 0.1 K/uL   Immature Granulocytes 1 %   Abs Immature Granulocytes 0.02 0.00 - 0.07 K/uL  Comprehensive metabolic panel  Result Value  Ref Range   Sodium 136 135 - 145 mmol/L   Potassium 4.3 3.5 - 5.1 mmol/L   Chloride 100 98 - 111 mmol/L   CO2 28 22 - 32 mmol/L   Glucose, Bld 226 (H) 70 - 99 mg/dL   BUN 16 8 - 23 mg/dL   Creatinine, Ser 6.29 0.61 - 1.24 mg/dL   Calcium 8.4 (L) 8.9 - 10.3 mg/dL   Total Protein 7.1 6.5 - 8.1 g/dL   Albumin 3.5 3.5 - 5.0 g/dL   AST 20 15 - 41 U/L   ALT 18 0 - 44 U/L   Alkaline Phosphatase 41 38 - 126 U/L   Total  Bilirubin 0.8 0.3 - 1.2 mg/dL   GFR calc non Af Amer >60 >60 mL/min   GFR calc Af Amer >60 >60 mL/min   Anion gap 8 5 - 15  C-reactive protein  Result Value Ref Range   CRP 8.4 (H) <1.0 mg/dL  D-dimer, quantitative (not at [facility])  Result Value Ref Range   D-Dimer, Quant 0.41 0.00 - 0.50 ug/mL-FEU  Magnesium  Result Value Ref Range   Magnesium 2.3 1.7 - 2.4 mg/dL  Ferritin  Result Value Ref Range   Ferritin 801 (H) 24 - 336 ng/mL  C-reactive protein  Result Value Ref Range   CRP 7.0 (H) <1.0 mg/dL  D-dimer, quantitative (not at [facility])  Result Value Ref Range   D-Dimer, Quant 0.44 0.00 - 0.50 ug/mL-FEU  Brain natriuretic peptide  Result Value Ref Range   B Natriuretic Peptide 21.9 0.0 - 100.0 pg/mL  Glucose, capillary  Result Value Ref Range   Glucose-Capillary 297 (H) 70 - 99 mg/dL  CBC with Differential/Platelet  Result Value Ref Range   WBC 8.1 4.0 - 10.5 K/uL   RBC 4.30 4.22 - 5.81 MIL/uL   Hemoglobin 12.7 (L) 13.0 - 17.0 g/dL   HCT 16.1 (L) 09.6 - 04.5 %   MCV 87.2 80.0 - 100.0 fL   MCH 29.5 26.0 - 34.0 pg   MCHC 33.9 30.0 - 36.0 g/dL   RDW 40.9 81.1 - 91.4 %   Platelets 166 150 - 400 K/uL   nRBC 0.0 0.0 - 0.2 %   Neutrophils Relative % 86 %   Neutro Abs 6.9 1.7 - 7.7 K/uL   Lymphocytes Relative 7 %   Lymphs Abs 0.6 (L) 0.7 - 4.0 K/uL   Monocytes Relative 7 %   Monocytes Absolute 0.6 0.1 - 1.0 K/uL   Eosinophils Relative 0 %   Eosinophils Absolute 0.0 0.0 - 0.5 K/uL   Basophils Relative 0 %   Basophils Absolute 0.0 0.0 - 0.1 K/uL   Immature  Granulocytes 0 %   Abs Immature Granulocytes 0.03 0.00 - 0.07 K/uL  Comprehensive metabolic panel  Result Value Ref Range   Sodium 137 135 - 145 mmol/L   Potassium 4.3 3.5 - 5.1 mmol/L   Chloride 98 98 - 111 mmol/L   CO2 28 22 - 32 mmol/L   Glucose, Bld 208 (H) 70 - 99 mg/dL   BUN 21 8 - 23 mg/dL   Creatinine, Ser 7.82 0.61 - 1.24 mg/dL   Calcium 8.4 (L) 8.9 - 10.3 mg/dL   Total Protein 6.6 6.5 - 8.1 g/dL   Albumin 3.3 (L) 3.5 - 5.0 g/dL   AST 20 15 - 41 U/L   ALT 18 0 - 44 U/L   Alkaline Phosphatase 38 38 - 126 U/L   Total Bilirubin 0.6 0.3 - 1.2 mg/dL   GFR calc non Af Amer >60 >60 mL/min   GFR calc Af Amer >60 >60 mL/min   Anion gap 11 5 - 15  C-reactive protein  Result Value Ref Range   CRP 3.7 (H) <1.0 mg/dL  D-dimer, quantitative (not at [facility])  Result Value Ref Range   D-Dimer, Quant 0.42 0.00 - 0.50 ug/mL-FEU  Magnesium  Result Value Ref Range   Magnesium 2.2 1.7 - 2.4 mg/dL  Ferritin  Result Value Ref Range   Ferritin 896 (H) 24 - 336 ng/mL  Hemoglobin A1c  Result Value Ref Range   Hgb A1c MFr Bld 7.3 (H) 4.8 - 5.6 %   Mean  Plasma Glucose 162.81 mg/dL  Glucose, capillary  Result Value Ref Range   Glucose-Capillary 262 (H) 70 - 99 mg/dL  Glucose, capillary  Result Value Ref Range   Glucose-Capillary 197 (H) 70 - 99 mg/dL  Glucose, capillary  Result Value Ref Range   Glucose-Capillary 152 (H) 70 - 99 mg/dL  Glucose, capillary  Result Value Ref Range   Glucose-Capillary 140 (H) 70 - 99 mg/dL  CBC with Differential/Platelet  Result Value Ref Range   WBC 7.7 4.0 - 10.5 K/uL   RBC 4.03 (L) 4.22 - 5.81 MIL/uL   Hemoglobin 11.9 (L) 13.0 - 17.0 g/dL   HCT 17.6 (L) 16.0 - 73.7 %   MCV 86.6 80.0 - 100.0 fL   MCH 29.5 26.0 - 34.0 pg   MCHC 34.1 30.0 - 36.0 g/dL   RDW 10.6 26.9 - 48.5 %   Platelets 181 150 - 400 K/uL   nRBC 0.0 0.0 - 0.2 %   Neutrophils Relative % 85 %   Neutro Abs 6.6 1.7 - 7.7 K/uL   Lymphocytes Relative 7 %   Lymphs Abs 0.5 (L) 0.7 - 4.0  K/uL   Monocytes Relative 7 %   Monocytes Absolute 0.5 0.1 - 1.0 K/uL   Eosinophils Relative 0 %   Eosinophils Absolute 0.0 0.0 - 0.5 K/uL   Basophils Relative 0 %   Basophils Absolute 0.0 0.0 - 0.1 K/uL   Immature Granulocytes 1 %   Abs Immature Granulocytes 0.09 (H) 0.00 - 0.07 K/uL  Comprehensive metabolic panel  Result Value Ref Range   Sodium 137 135 - 145 mmol/L   Potassium 4.0 3.5 - 5.1 mmol/L   Chloride 100 98 - 111 mmol/L   CO2 26 22 - 32 mmol/L   Glucose, Bld 206 (H) 70 - 99 mg/dL   BUN 18 8 - 23 mg/dL   Creatinine, Ser 4.62 0.61 - 1.24 mg/dL   Calcium 8.0 (L) 8.9 - 10.3 mg/dL   Total Protein 6.1 (L) 6.5 - 8.1 g/dL   Albumin 3.0 (L) 3.5 - 5.0 g/dL   AST 20 15 - 41 U/L   ALT 20 0 - 44 U/L   Alkaline Phosphatase 40 38 - 126 U/L   Total Bilirubin 0.7 0.3 - 1.2 mg/dL   GFR calc non Af Amer >60 >60 mL/min   GFR calc Af Amer >60 >60 mL/min   Anion gap 11 5 - 15  C-reactive protein  Result Value Ref Range   CRP 4.0 (H) <1.0 mg/dL  Glucose, capillary  Result Value Ref Range   Glucose-Capillary 191 (H) 70 - 99 mg/dL  Glucose, capillary  Result Value Ref Range   Glucose-Capillary 242 (H) 70 - 99 mg/dL  Glucose, capillary  Result Value Ref Range   Glucose-Capillary 172 (H) 70 - 99 mg/dL  Glucose, capillary  Result Value Ref Range   Glucose-Capillary 159 (H) 70 - 99 mg/dL  Comprehensive metabolic panel  Result Value Ref Range   Sodium 138 135 - 145 mmol/L   Potassium 4.4 3.5 - 5.1 mmol/L   Chloride 101 98 - 111 mmol/L   CO2 29 22 - 32 mmol/L   Glucose, Bld 231 (H) 70 - 99 mg/dL   BUN 19 8 - 23 mg/dL   Creatinine, Ser 7.03 0.61 - 1.24 mg/dL   Calcium 8.3 (L) 8.9 - 10.3 mg/dL   Total Protein 6.4 (L) 6.5 - 8.1 g/dL   Albumin 3.1 (L) 3.5 - 5.0 g/dL   AST 16 15 -  41 U/L   ALT 20 0 - 44 U/L   Alkaline Phosphatase 36 (L) 38 - 126 U/L   Total Bilirubin 0.8 0.3 - 1.2 mg/dL   GFR calc non Af Amer >60 >60 mL/min   GFR calc Af Amer >60 >60 mL/min   Anion gap 8 5 - 15   C-reactive protein  Result Value Ref Range   CRP 4.1 (H) <1.0 mg/dL  Glucose, capillary  Result Value Ref Range   Glucose-Capillary 285 (H) 70 - 99 mg/dL  Glucose, capillary  Result Value Ref Range   Glucose-Capillary 301 (H) 70 - 99 mg/dL  Glucose, capillary  Result Value Ref Range   Glucose-Capillary 198 (H) 70 - 99 mg/dL  Glucose, capillary  Result Value Ref Range   Glucose-Capillary 222 (H) 70 - 99 mg/dL  Glucose, capillary  Result Value Ref Range   Glucose-Capillary 301 (H) 70 - 99 mg/dL  ABO/Rh  Result Value Ref Range   ABO/RH(D)      O POS Performed at [facility], [address] [place], [place] [address]       Assessment & Plan:   Problem List Items Addressed This Visit      Cardiovascular and Mediastinum   Essential hypertension    The current medical regimen is effective;  continue present plan and medications.         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  Patient will continue current care.  Discussed will give Tussionex for cough We will continue isolation.        I discussed the assessment and treatment plan with the patient. The patient was provided an opportunity to ask questions and all were answered. The patient agreed with the plan and demonstrated an understanding of the instructions.   The patient was advised to call back or seek an in-person evaluation if the symptoms worsen or if the condition fails to improve as anticipated.   I provided 21+ minutes of time during this encounter.  Follow up plan: Return if symptoms worsen or fail to improve, for As scheduled.

## 2018-11-21 NOTE — Assessment & Plan Note (Signed)
The current medical regimen is effective;  continue present plan and medications.  

## 2018-11-21 NOTE — Assessment & Plan Note (Signed)
Discussed infection return to work status mouth sores and breathing status.  Patient will continue current care.  Discussed will give Tussionex for cough We will continue isolation.

## 2018-12-03 ENCOUNTER — Ambulatory Visit: Payer: BLUE CROSS/BLUE SHIELD | Admitting: Family Medicine

## 2018-12-06 ENCOUNTER — Ambulatory Visit (INDEPENDENT_AMBULATORY_CARE_PROVIDER_SITE_OTHER): Payer: BLUE CROSS/BLUE SHIELD | Admitting: Family Medicine

## 2018-12-06 ENCOUNTER — Encounter: Payer: Self-pay | Admitting: Family Medicine

## 2018-12-06 ENCOUNTER — Other Ambulatory Visit: Payer: Self-pay

## 2018-12-06 VITALS — BP 128/77 | HR 82 | Temp 99.0°F | Ht 69.0 in | Wt 197.0 lb

## 2018-12-06 DIAGNOSIS — U071 COVID-19: Secondary | ICD-10-CM

## 2018-12-06 DIAGNOSIS — R05 Cough: Secondary | ICD-10-CM | POA: Diagnosis not present

## 2018-12-06 DIAGNOSIS — R059 Cough, unspecified: Secondary | ICD-10-CM

## 2018-12-06 MED ORDER — HYDROCOD POLST-CPM POLST ER 10-8 MG/5ML PO SUER: 2.5000 mL | Freq: Two times a day (BID) | ORAL | 0 refills | Status: DC | PRN

## 2018-12-06 MED ORDER — ALBUTEROL SULFATE HFA 108 (90 BASE) MCG/ACT IN AERS: 2.0000 | INHALATION_SPRAY | Freq: Four times a day (QID) | RESPIRATORY_TRACT | 0 refills | Status: DC | PRN

## 2018-12-06 NOTE — Assessment & Plan Note (Signed)
Resolved. Feeling well. Seems to be doing a lot better. Slowly resolving.

## 2018-12-06 NOTE — Progress Notes (Signed)
BP 128/77   Pulse 82   Temp 99 F (37.2 C) (Oral)   Ht 5\' 9"  (1.753 m)   Wt 197 lb (89.4 kg)   BMI 29.09 kg/m    Subjective:    Patient ID: Scott Hester, male    DOB: 1955/08/01, 63 y.o.   MRN: [id number]  HPI: Scott Hester is a 63 y.o. male  Chief Complaint  Patient presents with  . Covid 19    f/u   Discharged from the hospital on 11/18/18 with COVID-19 pneumonia. He was initially hospitalized on 11/14/18 with +COVID after a week of SOB, cough, nausea, vomiting and hypoxemia at 80%. He followed up with his PCP virtually on 11/21/18 and still had a cough, but was otherwise feeling a lot better. Today, he is feeling a bit better. Still has a bit of a cough. He goes about 5-6 hours and then he coughs for about 30 minutes. Has been using [facility] cough drops and that has been helping. No more shortness of breath. No fevers. States he is 90% better, it's just the cough that is bothering him.  Relevant past medical, surgical, family and social history reviewed and updated as indicated. Interim medical history since our last visit reviewed. Allergies and medications reviewed and updated.  Review of Systems  Constitutional: Negative.   Respiratory: Positive for cough. Negative for apnea, choking, chest tightness, shortness of breath, wheezing and stridor.   Cardiovascular: Negative.   Gastrointestinal: Negative.   Neurological: Negative.   Psychiatric/Behavioral: Negative.     Per HPI unless specifically indicated above     Objective:    BP 128/77   Pulse 82   Temp 99 F (37.2 C) (Oral)   Ht 5\' 9"  (1.753 m)   Wt 197 lb (89.4 kg)   BMI 29.09 kg/m   Wt Readings from Last 3 Encounters:  12/06/18 197 lb (89.4 kg)  11/14/18 189 lb 15.9 oz (86.2 kg)  11/14/18 190 lb (86.2 kg)    Physical Exam Vitals signs and nursing note reviewed.  Constitutional:      General: He is not in acute distress.    Appearance: Normal appearance. He is not ill-appearing, toxic-appearing or  diaphoretic.  HENT:     Head: Normocephalic and atraumatic.     Right Ear: External ear normal.     Left Ear: External ear normal.     Nose: Nose normal.     Mouth/Throat:     Mouth: Mucous membranes are moist.     Pharynx: Oropharynx is clear.  Eyes:     General: No scleral icterus.       Right eye: No discharge.        Left eye: No discharge.     Extraocular Movements: Extraocular movements intact.     Conjunctiva/sclera: Conjunctivae normal.     Pupils: Pupils are equal, round, and reactive to light.  Neck:     Musculoskeletal: Normal range of motion and neck supple.  Cardiovascular:     Rate and Rhythm: Normal rate and regular rhythm.     Pulses: Normal pulses.     Heart sounds: Normal heart sounds. No murmur. No friction rub. No gallop.   Pulmonary:     Effort: Pulmonary effort is normal. No respiratory distress.     Breath sounds: Normal breath sounds. No stridor. No wheezing, rhonchi or rales.  Chest:     Chest wall: No tenderness.  Musculoskeletal: Normal range of motion.  Skin:  General: Skin is warm and dry.     Capillary Refill: Capillary refill takes less than 2 seconds.     Coloration: Skin is not jaundiced or pale.     Findings: No bruising, erythema, lesion or rash.  Neurological:     General: No focal deficit present.     Mental Status: He is alert and oriented to person, place, and time. Mental status is at baseline.  Psychiatric:        Mood and Affect: Mood normal.        Behavior: Behavior normal.        Thought Content: Thought content normal.        Judgment: Judgment normal.     Results for orders placed or performed during the hospital encounter of 11/14/18  HIV antibody (Routine Testing)  Result Value Ref Range   HIV Screen 4th Generation wRfx Non Reactive Non Reactive  CBC  Result Value Ref Range   WBC 3.9 (L) 4.0 - 10.5 K/uL   RBC 4.03 (L) 4.22 - 5.81 MIL/uL   Hemoglobin 12.3 (L) 13.0 - 17.0 g/dL   HCT 78.2 (L) 95.6 - 21.3 %   MCV  87.6 80.0 - 100.0 fL   MCH 30.5 26.0 - 34.0 pg   MCHC 34.8 30.0 - 36.0 g/dL   RDW 08.6 57.8 - 46.9 %   Platelets 115 (L) 150 - 400 K/uL   nRBC 0.0 0.0 - 0.2 %  Creatinine, serum  Result Value Ref Range   Creatinine, Ser 0.80 0.61 - 1.24 mg/dL   GFR calc non Af Amer >60 >60 mL/min   GFR calc Af Amer >60 >60 mL/min  CBC with Differential/Platelet  Result Value Ref Range   WBC 2.9 (L) 4.0 - 10.5 K/uL   RBC 4.54 4.22 - 5.81 MIL/uL   Hemoglobin 13.5 13.0 - 17.0 g/dL   HCT 62.9 52.8 - 41.3 %   MCV 87.7 80.0 - 100.0 fL   MCH 29.7 26.0 - 34.0 pg   MCHC 33.9 30.0 - 36.0 g/dL   RDW 24.4 01.0 - 27.2 %   Platelets 128 (L) 150 - 400 K/uL   nRBC 0.0 0.0 - 0.2 %   Neutrophils Relative % 80 %   Neutro Abs 2.4 1.7 - 7.7 K/uL   Lymphocytes Relative 15 %   Lymphs Abs 0.4 (L) 0.7 - 4.0 K/uL   Monocytes Relative 4 %   Monocytes Absolute 0.1 0.1 - 1.0 K/uL   Eosinophils Relative 0 %   Eosinophils Absolute 0.0 0.0 - 0.5 K/uL   Basophils Relative 0 %   Basophils Absolute 0.0 0.0 - 0.1 K/uL   Immature Granulocytes 1 %   Abs Immature Granulocytes 0.02 0.00 - 0.07 K/uL  Comprehensive metabolic panel  Result Value Ref Range   Sodium 136 135 - 145 mmol/L   Potassium 4.3 3.5 - 5.1 mmol/L   Chloride 100 98 - 111 mmol/L   CO2 28 22 - 32 mmol/L   Glucose, Bld 226 (H) 70 - 99 mg/dL   BUN 16 8 - 23 mg/dL   Creatinine, Ser 5.36 0.61 - 1.24 mg/dL   Calcium 8.4 (L) 8.9 - 10.3 mg/dL   Total Protein 7.1 6.5 - 8.1 g/dL   Albumin 3.5 3.5 - 5.0 g/dL   AST 20 15 - 41 U/L   ALT 18 0 - 44 U/L   Alkaline Phosphatase 41 38 - 126 U/L   Total Bilirubin 0.8 0.3 - 1.2 mg/dL   GFR  calc non Af Amer >60 >60 mL/min   GFR calc Af Amer >60 >60 mL/min   Anion gap 8 5 - 15  C-reactive protein  Result Value Ref Range   CRP 8.4 (H) <1.0 mg/dL  D-dimer, quantitative (not at [facility])  Result Value Ref Range   D-Dimer, Quant 0.41 0.00 - 0.50 ug/mL-FEU  Magnesium  Result Value Ref Range   Magnesium 2.3 1.7 - 2.4 mg/dL   Ferritin  Result Value Ref Range   Ferritin 801 (H) 24 - 336 ng/mL  C-reactive protein  Result Value Ref Range   CRP 7.0 (H) <1.0 mg/dL  D-dimer, quantitative (not at [facility])  Result Value Ref Range   D-Dimer, Quant 0.44 0.00 - 0.50 ug/mL-FEU  Brain natriuretic peptide  Result Value Ref Range   B Natriuretic Peptide 21.9 0.0 - 100.0 pg/mL  Glucose, capillary  Result Value Ref Range   Glucose-Capillary 297 (H) 70 - 99 mg/dL  CBC with Differential/Platelet  Result Value Ref Range   WBC 8.1 4.0 - 10.5 K/uL   RBC 4.30 4.22 - 5.81 MIL/uL   Hemoglobin 12.7 (L) 13.0 - 17.0 g/dL   HCT 16.1 (L) 09.6 - 04.5 %   MCV 87.2 80.0 - 100.0 fL   MCH 29.5 26.0 - 34.0 pg   MCHC 33.9 30.0 - 36.0 g/dL   RDW 40.9 81.1 - 91.4 %   Platelets 166 150 - 400 K/uL   nRBC 0.0 0.0 - 0.2 %   Neutrophils Relative % 86 %   Neutro Abs 6.9 1.7 - 7.7 K/uL   Lymphocytes Relative 7 %   Lymphs Abs 0.6 (L) 0.7 - 4.0 K/uL   Monocytes Relative 7 %   Monocytes Absolute 0.6 0.1 - 1.0 K/uL   Eosinophils Relative 0 %   Eosinophils Absolute 0.0 0.0 - 0.5 K/uL   Basophils Relative 0 %   Basophils Absolute 0.0 0.0 - 0.1 K/uL   Immature Granulocytes 0 %   Abs Immature Granulocytes 0.03 0.00 - 0.07 K/uL  Comprehensive metabolic panel  Result Value Ref Range   Sodium 137 135 - 145 mmol/L   Potassium 4.3 3.5 - 5.1 mmol/L   Chloride 98 98 - 111 mmol/L   CO2 28 22 - 32 mmol/L   Glucose, Bld 208 (H) 70 - 99 mg/dL   BUN 21 8 - 23 mg/dL   Creatinine, Ser 7.82 0.61 - 1.24 mg/dL   Calcium 8.4 (L) 8.9 - 10.3 mg/dL   Total Protein 6.6 6.5 - 8.1 g/dL   Albumin 3.3 (L) 3.5 - 5.0 g/dL   AST 20 15 - 41 U/L   ALT 18 0 - 44 U/L   Alkaline Phosphatase 38 38 - 126 U/L   Total Bilirubin 0.6 0.3 - 1.2 mg/dL   GFR calc non Af Amer >60 >60 mL/min   GFR calc Af Amer >60 >60 mL/min   Anion gap 11 5 - 15  C-reactive protein  Result Value Ref Range   CRP 3.7 (H) <1.0 mg/dL  D-dimer, quantitative (not at [facility])  Result Value Ref Range    D-Dimer, Quant 0.42 0.00 - 0.50 ug/mL-FEU  Magnesium  Result Value Ref Range   Magnesium 2.2 1.7 - 2.4 mg/dL  Ferritin  Result Value Ref Range   Ferritin 896 (H) 24 - 336 ng/mL  Hemoglobin A1c  Result Value Ref Range   Hgb A1c MFr Bld 7.3 (H) 4.8 - 5.6 %   Mean Plasma Glucose 162.81 mg/dL  Glucose, capillary  Result Value Ref Range   Glucose-Capillary 262 (H) 70 - 99 mg/dL  Glucose, capillary  Result Value Ref Range   Glucose-Capillary 197 (H) 70 - 99 mg/dL  Glucose, capillary  Result Value Ref Range   Glucose-Capillary 152 (H) 70 - 99 mg/dL  Glucose, capillary  Result Value Ref Range   Glucose-Capillary 140 (H) 70 - 99 mg/dL  CBC with Differential/Platelet  Result Value Ref Range   WBC 7.7 4.0 - 10.5 K/uL   RBC 4.03 (L) 4.22 - 5.81 MIL/uL   Hemoglobin 11.9 (L) 13.0 - 17.0 g/dL   HCT 16.134.9 (L) 09.639.0 - 04.552.0 %   MCV 86.6 80.0 - 100.0 fL   MCH 29.5 26.0 - 34.0 pg   MCHC 34.1 30.0 - 36.0 g/dL   RDW 40.911.6 81.111.5 - 91.415.5 %   Platelets 181 150 - 400 K/uL   nRBC 0.0 0.0 - 0.2 %   Neutrophils Relative % 85 %   Neutro Abs 6.6 1.7 - 7.7 K/uL   Lymphocytes Relative 7 %   Lymphs Abs 0.5 (L) 0.7 - 4.0 K/uL   Monocytes Relative 7 %   Monocytes Absolute 0.5 0.1 - 1.0 K/uL   Eosinophils Relative 0 %   Eosinophils Absolute 0.0 0.0 - 0.5 K/uL   Basophils Relative 0 %   Basophils Absolute 0.0 0.0 - 0.1 K/uL   Immature Granulocytes 1 %   Abs Immature Granulocytes 0.09 (H) 0.00 - 0.07 K/uL  Comprehensive metabolic panel  Result Value Ref Range   Sodium 137 135 - 145 mmol/L   Potassium 4.0 3.5 - 5.1 mmol/L   Chloride 100 98 - 111 mmol/L   CO2 26 22 - 32 mmol/L   Glucose, Bld 206 (H) 70 - 99 mg/dL   BUN 18 8 - 23 mg/dL   Creatinine, Ser 7.820.78 0.61 - 1.24 mg/dL   Calcium 8.0 (L) 8.9 - 10.3 mg/dL   Total Protein 6.1 (L) 6.5 - 8.1 g/dL   Albumin 3.0 (L) 3.5 - 5.0 g/dL   AST 20 15 - 41 U/L   ALT 20 0 - 44 U/L   Alkaline Phosphatase 40 38 - 126 U/L   Total Bilirubin 0.7 0.3 - 1.2 mg/dL    GFR calc non Af Amer >60 >60 mL/min   GFR calc Af Amer >60 >60 mL/min   Anion gap 11 5 - 15  C-reactive protein  Result Value Ref Range   CRP 4.0 (H) <1.0 mg/dL  Glucose, capillary  Result Value Ref Range   Glucose-Capillary 191 (H) 70 - 99 mg/dL  Glucose, capillary  Result Value Ref Range   Glucose-Capillary 242 (H) 70 - 99 mg/dL  Glucose, capillary  Result Value Ref Range   Glucose-Capillary 172 (H) 70 - 99 mg/dL  Glucose, capillary  Result Value Ref Range   Glucose-Capillary 159 (H) 70 - 99 mg/dL  Comprehensive metabolic panel  Result Value Ref Range   Sodium 138 135 - 145 mmol/L   Potassium 4.4 3.5 - 5.1 mmol/L   Chloride 101 98 - 111 mmol/L   CO2 29 22 - 32 mmol/L   Glucose, Bld 231 (H) 70 - 99 mg/dL   BUN 19 8 - 23 mg/dL   Creatinine, Ser 9.560.78 0.61 - 1.24 mg/dL   Calcium 8.3 (L) 8.9 - 10.3 mg/dL   Total Protein 6.4 (L) 6.5 - 8.1 g/dL   Albumin 3.1 (L) 3.5 - 5.0 g/dL   AST 16 15 - 41 U/L   ALT 20 0 -  44 U/L   Alkaline Phosphatase 36 (L) 38 - 126 U/L   Total Bilirubin 0.8 0.3 - 1.2 mg/dL   GFR calc non Af Amer >60 >60 mL/min   GFR calc Af Amer >60 >60 mL/min   Anion gap 8 5 - 15  C-reactive protein  Result Value Ref Range   CRP 4.1 (H) <1.0 mg/dL  Glucose, capillary  Result Value Ref Range   Glucose-Capillary 285 (H) 70 - 99 mg/dL  Glucose, capillary  Result Value Ref Range   Glucose-Capillary 301 (H) 70 - 99 mg/dL  Glucose, capillary  Result Value Ref Range   Glucose-Capillary 198 (H) 70 - 99 mg/dL  Glucose, capillary  Result Value Ref Range   Glucose-Capillary 222 (H) 70 - 99 mg/dL  Glucose, capillary  Result Value Ref Range   Glucose-Capillary 301 (H) 70 - 99 mg/dL  ABO/Rh  Result Value Ref Range   ABO/RH(D)      O POS Performed at [facility], [address], [place], [place] [address]       Assessment & Plan:   Problem List Items Addressed This Visit      Other   COVID-19 virus infection - Primary    Resolved. Feeling  well. Seems to be doing a lot better. Slowly resolving.       Other Visit Diagnoses    Cough       Post-infectious cough, seems to be resolving, but taking time. Start inhaler, continue tussionex. Recheck on Wednesday to see about work.  Call with concerns.        Follow up plan: Return Wednesday, for follow up with PCP by phone to see about going back to work.

## 2018-12-11 ENCOUNTER — Encounter: Payer: Self-pay | Admitting: Family Medicine

## 2018-12-11 ENCOUNTER — Ambulatory Visit (INDEPENDENT_AMBULATORY_CARE_PROVIDER_SITE_OTHER): Payer: BC Managed Care – PPO | Admitting: Family Medicine

## 2018-12-11 ENCOUNTER — Other Ambulatory Visit: Payer: Self-pay

## 2018-12-11 DIAGNOSIS — E1169 Type 2 diabetes mellitus with other specified complication: Secondary | ICD-10-CM | POA: Diagnosis not present

## 2018-12-11 DIAGNOSIS — E7800 Pure hypercholesterolemia, unspecified: Secondary | ICD-10-CM | POA: Diagnosis not present

## 2018-12-11 DIAGNOSIS — E785 Hyperlipidemia, unspecified: Secondary | ICD-10-CM | POA: Insufficient documentation

## 2018-12-11 DIAGNOSIS — I10 Essential (primary) hypertension: Secondary | ICD-10-CM

## 2018-12-11 NOTE — Progress Notes (Signed)
   There were no vitals taken for this visit.   Subjective:    Patient ID: Scott Hester, male    DOB: 04/24/1956, 63 y.o.   MRN: [id number]  HPI: Scott Hester is a 63 y.o. male  Med check  Telemedicine using audio/video telecommunications for a synchronous communication visit. Today's visit due to COVID-19 isolation precautions I connected with and verified that I am speaking with the correct person using two identifiers.   I discussed the limitations, risks, security and privacy concerns of performing an evaluation and management service by telecommunication and the availability of in person appointments. I also discussed with the patient that there may be a patient responsible charge related to this service. The patient expressed understanding and agreed to proceed. The patient's location is home. I am at home.  Relevant past medical, surgical, family and social history reviewed and updated as indicated. Interim medical history since our last visit reviewed. Allergies and medications reviewed and updated.  Review of Systems  Constitutional: Negative.   Respiratory: Negative.   Cardiovascular: Negative.     Per HPI unless specifically indicated above     Objective:    There were no vitals taken for this visit.  Wt Readings from Last 3 Encounters:  12/06/18 197 lb (89.4 kg)  11/14/18 189 lb 15.9 oz (86.2 kg)  11/14/18 190 lb (86.2 kg)    Physical Exam      Assessment & Plan:   Problem List Items Addressed This Visit      Cardiovascular and Mediastinum   Essential hypertension    The current medical regimen is effective;  continue present plan and medications.         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         Other   Hypercholesteremia    The current medical regimen is effective;  continue present plan and medications.           I discussed the assessment and treatment plan with the patient. The patient was provided an opportunity to ask questions and all were answered. The patient agreed with the plan and demonstrated an understanding of the instructions.   The patient was advised to call back or seek an in-person evaluation if the symptoms worsen or if the condition fails to improve as anticipated.   I provided 21+ minutes of time during this encounter. Follow up plan: Return in about 6 months (around 06/12/2019) for Physical Exam, Hemoglobin A1c.

## 2018-12-11 NOTE — Assessment & Plan Note (Signed)
The current medical regimen is effective;  continue present plan and medications.  

## 2019-05-07 ENCOUNTER — Encounter: Payer: Self-pay | Admitting: Family Medicine

## 2019-05-09 ENCOUNTER — Ambulatory Visit (INDEPENDENT_AMBULATORY_CARE_PROVIDER_SITE_OTHER): Payer: BC Managed Care – PPO

## 2019-05-09 ENCOUNTER — Other Ambulatory Visit: Payer: Self-pay

## 2019-05-09 DIAGNOSIS — Z23 Encounter for immunization: Secondary | ICD-10-CM

## 2019-06-12 ENCOUNTER — Ambulatory Visit: Payer: Self-pay | Admitting: Family Medicine

## 2019-06-16 ENCOUNTER — Ambulatory Visit: Payer: Self-pay | Admitting: Family Medicine

## 2019-06-16 ENCOUNTER — Encounter: Payer: Self-pay | Admitting: Family Medicine

## 2019-06-16 ENCOUNTER — Ambulatory Visit (INDEPENDENT_AMBULATORY_CARE_PROVIDER_SITE_OTHER): Payer: BC Managed Care – PPO | Admitting: Family Medicine

## 2019-06-16 ENCOUNTER — Other Ambulatory Visit: Payer: Self-pay

## 2019-06-16 VITALS — BP 112/71 | HR 61 | Ht 69.49 in | Wt 199.5 lb

## 2019-06-16 DIAGNOSIS — Z23 Encounter for immunization: Secondary | ICD-10-CM

## 2019-06-16 DIAGNOSIS — N138 Other obstructive and reflux uropathy: Secondary | ICD-10-CM

## 2019-06-16 DIAGNOSIS — E7800 Pure hypercholesterolemia, unspecified: Secondary | ICD-10-CM

## 2019-06-16 DIAGNOSIS — Z1159 Encounter for screening for other viral diseases: Secondary | ICD-10-CM

## 2019-06-16 DIAGNOSIS — Z0000 Encounter for general adult medical examination without abnormal findings: Secondary | ICD-10-CM | POA: Diagnosis not present

## 2019-06-16 DIAGNOSIS — E119 Type 2 diabetes mellitus without complications: Secondary | ICD-10-CM

## 2019-06-16 DIAGNOSIS — I10 Essential (primary) hypertension: Secondary | ICD-10-CM

## 2019-06-16 DIAGNOSIS — N401 Enlarged prostate with lower urinary tract symptoms: Secondary | ICD-10-CM | POA: Diagnosis not present

## 2019-06-16 LAB — URINALYSIS, ROUTINE W REFLEX MICROSCOPIC
Bilirubin, UA: NEGATIVE
Glucose, UA: NEGATIVE
Ketones, UA: NEGATIVE
Leukocytes,UA: NEGATIVE
Nitrite, UA: NEGATIVE
Protein,UA: NEGATIVE
RBC, UA: NEGATIVE
Specific Gravity, UA: 1.015 (ref 1.005–1.030)
Urobilinogen, Ur: 0.2 mg/dL (ref 0.2–1.0)
pH, UA: 6 (ref 5.0–7.5)

## 2019-06-16 LAB — BAYER DCA HB A1C WAIVED: HB A1C (BAYER DCA - WAIVED): 6.6 % (ref <7.0)

## 2019-06-16 LAB — MICROALBUMIN, URINE WAIVED
Creatinine, Urine Waived: 50 mg/dL (ref 10–300)
Microalb, Ur Waived: 10 mg/L (ref 0–19)

## 2019-06-16 MED ORDER — BENAZEPRIL HCL 40 MG PO TABS: 40.0000 mg | ORAL_TABLET | Freq: Every day | ORAL | 1 refills | Status: DC

## 2019-06-16 MED ORDER — AMLODIPINE BESYLATE 5 MG PO TABS: 5.0000 mg | ORAL_TABLET | Freq: Every day | ORAL | 1 refills | Status: DC

## 2019-06-16 MED ORDER — FINASTERIDE 5 MG PO TABS: 5.0000 mg | ORAL_TABLET | Freq: Every day | ORAL | 1 refills | Status: DC

## 2019-06-16 MED ORDER — ATORVASTATIN CALCIUM 40 MG PO TABS: 40.0000 mg | ORAL_TABLET | Freq: Every day | ORAL | 1 refills | Status: DC

## 2019-06-16 MED ORDER — TRIAMCINOLONE ACETONIDE 0.5 % EX OINT: 1.0000 "application " | TOPICAL_OINTMENT | Freq: Two times a day (BID) | CUTANEOUS | 1 refills | Status: DC

## 2019-06-16 NOTE — Assessment & Plan Note (Signed)
Under good control on current regimen. Continue current regimen. Continue to monitor. Call with any concerns. Refills given. Labs drawn today.   

## 2019-06-16 NOTE — Assessment & Plan Note (Signed)
Starting atorvastatin. Call with any concerns. Recheck 3 months.

## 2019-06-16 NOTE — Patient Instructions (Signed)

## 2019-06-16 NOTE — Progress Notes (Signed)
BP 112/71   Pulse 61   Ht 5' 9.49" (1.765 m)   Wt 199 lb 8 oz (90.5 kg)   SpO2 99%   BMI 29.05 kg/m    Subjective:    Patient ID: Scott Hester, male    DOB: 01/26/1956, 62 y.o.   MRN: [id number]  HPI: Scott Hester is a 63 y.o. male presenting on 06/16/2019 for comprehensive medical examination. Current medical complaints include:  HYPERTENSION / HYPERLIPIDEMIA Satisfied with current treatment? yes Duration of hypertension: chronic BP monitoring frequency: not checking BP medication side effects: no Past BP meds: benazepril, amlodipine Duration of hyperlipidemia: chronic Cholesterol medication side effects: not on anything Cholesterol supplements: none Past cholesterol medications: none Medication compliance: excellent compliance Aspirin: no Recent stressors: no Recurrent headaches: no Visual changes: no Palpitations: no Dyspnea: no Chest pain: no Lower extremity edema: no Dizzy/lightheaded: no  DIABETES Hypoglycemic episodes:no Polydipsia/polyuria: no Visual disturbance: no Chest pain: no Paresthesias: no Glucose Monitoring: no  Accucheck frequency: Not Checking Taking Insulin?: no Blood Pressure Monitoring: not checking Retinal Examination: Not up to Date Foot Exam: Done today Diabetic Education: Not Completed Pneumovax: Done today Influenza: Up to Date Aspirin: no  BPH BPH status: controlled Satisfied with current treatment?: yes Medication side effects: no Medication compliance: excellent compliance Duration: chronic Nocturia: 1-2x per night Urinary frequency:no Incomplete voiding: no Urgency: no Weak urinary stream: no Straining to start stream: no Dysuria: no Onset: gradual Severity: mild  Interim Problems from his last visit: no  Depression Screen done today and results listed below:  Depression screen [facility] 2/9 06/16/2019 05/08/2018 10/31/2017 05/02/2017 10/31/2016  Decreased Interest 0 0 0 0 0  Down, Depressed, Hopeless 0 0 0 0 0  PHQ  - 2 Score 0 0 0 0 0    Past Medical History:  Past Medical History:  Diagnosis Date  . BPH (benign prostatic hyperplasia)   . Hypertension     Surgical History:  History reviewed. No pertinent surgical history.  Medications:  Current Outpatient Medications on File Prior to Visit  Medication Sig  . tamsulosin (FLOMAX) 0.4 MG CAPS capsule Take 0.4 mg by mouth.  Marland Kitchen acetaminophen (TYLENOL) 325 MG tablet Take 650 mg by mouth every 6 (six) hours as needed.  Marland Kitchen albuterol (VENTOLIN HFA) 108 (90 Base) MCG/ACT inhaler Inhale 2 puffs into the lungs every 6 (six) hours as needed for wheezing or shortness of breath.  . guaifenesin (ROBITUSSIN) 100 MG/5ML syrup Take 200 mg by mouth 3 (three) times daily as needed for cough.   No current facility-administered medications on file prior to visit.     Allergies:  No Known Allergies  Social History:  Social History   Socioeconomic History  . Marital status: Single    Spouse name: Not on file  . Number of children: Not on file  . Years of education: Not on file  . Highest education level: Not on file  Occupational History  . Not on file  Social Needs  . Financial resource strain: Not on file  . Food insecurity    Worry: Not on file    Inability: Not on file  . Transportation needs    Medical: Not on file    Non-medical: Not on file  Tobacco Use  . Smoking status: Never Smoker  . Smokeless tobacco: Never Used  Substance and Sexual Activity  . Alcohol use: No  . Drug use: No  . Sexual activity: Yes  Lifestyle  . Physical activity  Days per week: 7 days    Minutes per session: 30 min  . Stress: Not at all  Relationships  . Social connections    Talks on phone: More than three times a week    Gets together: Patient refused    Attends religious service: Patient refused    Active member of club or organization: No    Attends meetings of clubs or organizations: Never    Relationship status: Married  . Intimate partner violence     Fear of current or ex partner: No    Emotionally abused: No    Physically abused: No    Forced sexual activity: No  Other Topics Concern  . Not on file  Social History Narrative  . Not on file   Social History   Tobacco Use  Smoking Status Never Smoker  Smokeless Tobacco Never Used   Social History   Substance and Sexual Activity  Alcohol Use No    Family History:  Family History  Problem Relation Age of Onset  . Hypertension Mother   . Hypertension Father   . Diabetes Father   . Hypertension Maternal Grandmother   . Diabetes Maternal Grandmother     Past medical history, surgical history, medications, allergies, family history and social history reviewed with patient today and changes made to appropriate areas of the chart.   Review of Systems  Constitutional: Negative.   HENT: Negative.   Eyes: Negative.   Respiratory: Negative.   Cardiovascular: Negative.   Gastrointestinal: Negative.   Genitourinary: Negative.   Musculoskeletal: Negative.   Skin: Positive for rash. Negative for itching.  Neurological: Negative.   Endo/Heme/Allergies: Negative.   Psychiatric/Behavioral: Negative.     All other ROS negative except what is listed above and in the HPI.      Objective:    BP 112/71   Pulse 61   Ht 5' 9.49" (1.765 m)   Wt 199 lb 8 oz (90.5 kg)   SpO2 99%   BMI 29.05 kg/m   Wt Readings from Last 3 Encounters:  06/16/19 199 lb 8 oz (90.5 kg)  12/06/18 197 lb (89.4 kg)  11/14/18 189 lb 15.9 oz (86.2 kg)    Physical Exam Vitals signs and nursing note reviewed.  Constitutional:      General: He is not in acute distress.    Appearance: Normal appearance. He is not ill-appearing, toxic-appearing or diaphoretic.  HENT:     Head: Normocephalic and atraumatic.     Right Ear: Tympanic membrane, ear canal and external ear normal. There is no impacted cerumen.     Left Ear: Tympanic membrane, ear canal and external ear normal. There is no impacted cerumen.      Nose: Nose normal. No congestion or rhinorrhea.     Mouth/Throat:     Mouth: Mucous membranes are moist.     Pharynx: Oropharynx is clear. No oropharyngeal exudate or posterior oropharyngeal erythema.  Eyes:     General: No scleral icterus.       Right eye: No discharge.        Left eye: No discharge.     Extraocular Movements: Extraocular movements intact.     Conjunctiva/sclera: Conjunctivae normal.     Pupils: Pupils are equal, round, and reactive to light.  Neck:     Musculoskeletal: Normal range of motion and neck supple. No neck rigidity or muscular tenderness.     Vascular: No carotid bruit.  Cardiovascular:     Rate and Rhythm: Normal  rate and regular rhythm.     Pulses: Normal pulses.     Heart sounds: No murmur. No friction rub. No gallop.   Pulmonary:     Effort: Pulmonary effort is normal. No respiratory distress.     Breath sounds: Normal breath sounds. No stridor. No wheezing, rhonchi or rales.  Chest:     Chest wall: No tenderness.  Abdominal:     General: Abdomen is flat. Bowel sounds are normal. There is no distension.     Palpations: Abdomen is soft. There is no mass.     Tenderness: There is no abdominal tenderness. There is no right CVA tenderness, left CVA tenderness, guarding or rebound.     Hernia: No hernia is present.  Genitourinary:    Comments: Genital exam deferred with shared decision making Musculoskeletal:        General: No swelling, tenderness, deformity or signs of injury.     Right lower leg: No edema.     Left lower leg: No edema.  Lymphadenopathy:     Cervical: No cervical adenopathy.  Skin:    General: Skin is warm and dry.     Capillary Refill: Capillary refill takes less than 2 seconds.     Coloration: Skin is not jaundiced or pale.     Findings: No bruising, erythema, lesion or rash.  Neurological:     General: No focal deficit present.     Mental Status: He is alert and oriented to person, place, and time.     Cranial Nerves:  No cranial nerve deficit.     Sensory: No sensory deficit.     Motor: No weakness.     Coordination: Coordination normal.     Gait: Gait normal.     Deep Tendon Reflexes: Reflexes normal.  Psychiatric:        Mood and Affect: Mood normal.        Behavior: Behavior normal.        Thought Content: Thought content normal.        Judgment: Judgment normal.     Results for orders placed or performed during the hospital encounter of 11/14/18  HIV antibody (Routine Testing)  Result Value Ref Range   HIV Screen 4th Generation wRfx Non Reactive Non Reactive  CBC  Result Value Ref Range   WBC 3.9 (L) 4.0 - 10.5 K/uL   RBC 4.03 (L) 4.22 - 5.81 MIL/uL   Hemoglobin 12.3 (L) 13.0 - 17.0 g/dL   HCT 40.9 (L) 81.1 - 91.4 %   MCV 87.6 80.0 - 100.0 fL   MCH 30.5 26.0 - 34.0 pg   MCHC 34.8 30.0 - 36.0 g/dL   RDW 78.2 95.6 - 21.3 %   Platelets 115 (L) 150 - 400 K/uL   nRBC 0.0 0.0 - 0.2 %  Creatinine, serum  Result Value Ref Range   Creatinine, Ser 0.80 0.61 - 1.24 mg/dL   GFR calc non Af Amer >60 >60 mL/min   GFR calc Af Amer >60 >60 mL/min  CBC with Differential/Platelet  Result Value Ref Range   WBC 2.9 (L) 4.0 - 10.5 K/uL   RBC 4.54 4.22 - 5.81 MIL/uL   Hemoglobin 13.5 13.0 - 17.0 g/dL   HCT 08.6 57.8 - 46.9 %   MCV 87.7 80.0 - 100.0 fL   MCH 29.7 26.0 - 34.0 pg   MCHC 33.9 30.0 - 36.0 g/dL   RDW 62.9 52.8 - 41.3 %   Platelets 128 (L) 150 - 400 K/uL  nRBC 0.0 0.0 - 0.2 %   Neutrophils Relative % 80 %   Neutro Abs 2.4 1.7 - 7.7 K/uL   Lymphocytes Relative 15 %   Lymphs Abs 0.4 (L) 0.7 - 4.0 K/uL   Monocytes Relative 4 %   Monocytes Absolute 0.1 0.1 - 1.0 K/uL   Eosinophils Relative 0 %   Eosinophils Absolute 0.0 0.0 - 0.5 K/uL   Basophils Relative 0 %   Basophils Absolute 0.0 0.0 - 0.1 K/uL   Immature Granulocytes 1 %   Abs Immature Granulocytes 0.02 0.00 - 0.07 K/uL  Comprehensive metabolic panel  Result Value Ref Range   Sodium 136 135 - 145 mmol/L   Potassium 4.3 3.5 -  5.1 mmol/L   Chloride 100 98 - 111 mmol/L   CO2 28 22 - 32 mmol/L   Glucose, Bld 226 (H) 70 - 99 mg/dL   BUN 16 8 - 23 mg/dL   Creatinine, Ser 1.61 0.61 - 1.24 mg/dL   Calcium 8.4 (L) 8.9 - 10.3 mg/dL   Total Protein 7.1 6.5 - 8.1 g/dL   Albumin 3.5 3.5 - 5.0 g/dL   AST 20 15 - 41 U/L   ALT 18 0 - 44 U/L   Alkaline Phosphatase 41 38 - 126 U/L   Total Bilirubin 0.8 0.3 - 1.2 mg/dL   GFR calc non Af Amer >60 >60 mL/min   GFR calc Af Amer >60 >60 mL/min   Anion gap 8 5 - 15  C-reactive protein  Result Value Ref Range   CRP 8.4 (H) <1.0 mg/dL  D-dimer, quantitative (not at [facility])  Result Value Ref Range   D-Dimer, Quant 0.41 0.00 - 0.50 ug/mL-FEU  Magnesium  Result Value Ref Range   Magnesium 2.3 1.7 - 2.4 mg/dL  Ferritin  Result Value Ref Range   Ferritin 801 (H) 24 - 336 ng/mL  C-reactive protein  Result Value Ref Range   CRP 7.0 (H) <1.0 mg/dL  D-dimer, quantitative (not at [facility])  Result Value Ref Range   D-Dimer, Quant 0.44 0.00 - 0.50 ug/mL-FEU  Brain natriuretic peptide  Result Value Ref Range   B Natriuretic Peptide 21.9 0.0 - 100.0 pg/mL  Glucose, capillary  Result Value Ref Range   Glucose-Capillary 297 (H) 70 - 99 mg/dL  CBC with Differential/Platelet  Result Value Ref Range   WBC 8.1 4.0 - 10.5 K/uL   RBC 4.30 4.22 - 5.81 MIL/uL   Hemoglobin 12.7 (L) 13.0 - 17.0 g/dL   HCT 09.6 (L) 04.5 - 40.9 %   MCV 87.2 80.0 - 100.0 fL   MCH 29.5 26.0 - 34.0 pg   MCHC 33.9 30.0 - 36.0 g/dL   RDW 81.1 91.4 - 78.2 %   Platelets 166 150 - 400 K/uL   nRBC 0.0 0.0 - 0.2 %   Neutrophils Relative % 86 %   Neutro Abs 6.9 1.7 - 7.7 K/uL   Lymphocytes Relative 7 %   Lymphs Abs 0.6 (L) 0.7 - 4.0 K/uL   Monocytes Relative 7 %   Monocytes Absolute 0.6 0.1 - 1.0 K/uL   Eosinophils Relative 0 %   Eosinophils Absolute 0.0 0.0 - 0.5 K/uL   Basophils Relative 0 %   Basophils Absolute 0.0 0.0 - 0.1 K/uL   Immature Granulocytes 0 %   Abs Immature Granulocytes 0.03 0.00 - 0.07 K/uL   Comprehensive metabolic panel  Result Value Ref Range   Sodium 137 135 - 145 mmol/L   Potassium 4.3 3.5 -  5.1 mmol/L   Chloride 98 98 - 111 mmol/L   CO2 28 22 - 32 mmol/L   Glucose, Bld 208 (H) 70 - 99 mg/dL   BUN 21 8 - 23 mg/dL   Creatinine, Ser 7.25 0.61 - 1.24 mg/dL   Calcium 8.4 (L) 8.9 - 10.3 mg/dL   Total Protein 6.6 6.5 - 8.1 g/dL   Albumin 3.3 (L) 3.5 - 5.0 g/dL   AST 20 15 - 41 U/L   ALT 18 0 - 44 U/L   Alkaline Phosphatase 38 38 - 126 U/L   Total Bilirubin 0.6 0.3 - 1.2 mg/dL   GFR calc non Af Amer >60 >60 mL/min   GFR calc Af Amer >60 >60 mL/min   Anion gap 11 5 - 15  C-reactive protein  Result Value Ref Range   CRP 3.7 (H) <1.0 mg/dL  D-dimer, quantitative (not at [facility])  Result Value Ref Range   D-Dimer, Quant 0.42 0.00 - 0.50 ug/mL-FEU  Magnesium  Result Value Ref Range   Magnesium 2.2 1.7 - 2.4 mg/dL  Ferritin  Result Value Ref Range   Ferritin 896 (H) 24 - 336 ng/mL  Hemoglobin A1c  Result Value Ref Range   Hgb A1c MFr Bld 7.3 (H) 4.8 - 5.6 %   Mean Plasma Glucose 162.81 mg/dL  Glucose, capillary  Result Value Ref Range   Glucose-Capillary 262 (H) 70 - 99 mg/dL  Glucose, capillary  Result Value Ref Range   Glucose-Capillary 197 (H) 70 - 99 mg/dL  Glucose, capillary  Result Value Ref Range   Glucose-Capillary 152 (H) 70 - 99 mg/dL  Glucose, capillary  Result Value Ref Range   Glucose-Capillary 140 (H) 70 - 99 mg/dL  CBC with Differential/Platelet  Result Value Ref Range   WBC 7.7 4.0 - 10.5 K/uL   RBC 4.03 (L) 4.22 - 5.81 MIL/uL   Hemoglobin 11.9 (L) 13.0 - 17.0 g/dL   HCT 36.6 (L) 44.0 - 34.7 %   MCV 86.6 80.0 - 100.0 fL   MCH 29.5 26.0 - 34.0 pg   MCHC 34.1 30.0 - 36.0 g/dL   RDW 42.5 95.6 - 38.7 %   Platelets 181 150 - 400 K/uL   nRBC 0.0 0.0 - 0.2 %   Neutrophils Relative % 85 %   Neutro Abs 6.6 1.7 - 7.7 K/uL   Lymphocytes Relative 7 %   Lymphs Abs 0.5 (L) 0.7 - 4.0 K/uL   Monocytes Relative 7 %   Monocytes Absolute 0.5 0.1 - 1.0  K/uL   Eosinophils Relative 0 %   Eosinophils Absolute 0.0 0.0 - 0.5 K/uL   Basophils Relative 0 %   Basophils Absolute 0.0 0.0 - 0.1 K/uL   Immature Granulocytes 1 %   Abs Immature Granulocytes 0.09 (H) 0.00 - 0.07 K/uL  Comprehensive metabolic panel  Result Value Ref Range   Sodium 137 135 - 145 mmol/L   Potassium 4.0 3.5 - 5.1 mmol/L   Chloride 100 98 - 111 mmol/L   CO2 26 22 - 32 mmol/L   Glucose, Bld 206 (H) 70 - 99 mg/dL   BUN 18 8 - 23 mg/dL   Creatinine, Ser 5.64 0.61 - 1.24 mg/dL   Calcium 8.0 (L) 8.9 - 10.3 mg/dL   Total Protein 6.1 (L) 6.5 - 8.1 g/dL   Albumin 3.0 (L) 3.5 - 5.0 g/dL   AST 20 15 - 41 U/L   ALT 20 0 - 44 U/L   Alkaline Phosphatase 40 38 -  126 U/L   Total Bilirubin 0.7 0.3 - 1.2 mg/dL   GFR calc non Af Amer >60 >60 mL/min   GFR calc Af Amer >60 >60 mL/min   Anion gap 11 5 - 15  C-reactive protein  Result Value Ref Range   CRP 4.0 (H) <1.0 mg/dL  Glucose, capillary  Result Value Ref Range   Glucose-Capillary 191 (H) 70 - 99 mg/dL  Glucose, capillary  Result Value Ref Range   Glucose-Capillary 242 (H) 70 - 99 mg/dL  Glucose, capillary  Result Value Ref Range   Glucose-Capillary 172 (H) 70 - 99 mg/dL  Glucose, capillary  Result Value Ref Range   Glucose-Capillary 159 (H) 70 - 99 mg/dL  Comprehensive metabolic panel  Result Value Ref Range   Sodium 138 135 - 145 mmol/L   Potassium 4.4 3.5 - 5.1 mmol/L   Chloride 101 98 - 111 mmol/L   CO2 29 22 - 32 mmol/L   Glucose, Bld 231 (H) 70 - 99 mg/dL   BUN 19 8 - 23 mg/dL   Creatinine, Ser 0.78 0.61 - 1.24 mg/dL   Calcium 8.3 (L) 8.9 - 10.3 mg/dL   Total Protein 6.4 (L) 6.5 - 8.1 g/dL   Albumin 3.1 (L) 3.5 - 5.0 g/dL   AST 16 15 - 41 U/L   ALT 20 0 - 44 U/L   Alkaline Phosphatase 36 (L) 38 - 126 U/L   Total Bilirubin 0.8 0.3 - 1.2 mg/dL   GFR calc non Af Amer >60 >60 mL/min   GFR calc Af Amer >60 >60 mL/min   Anion gap 8 5 - 15  C-reactive protein  Result Value Ref Range   CRP 4.1 (H) <1.0  mg/dL  Glucose, capillary  Result Value Ref Range   Glucose-Capillary 285 (H) 70 - 99 mg/dL  Glucose, capillary  Result Value Ref Range   Glucose-Capillary 301 (H) 70 - 99 mg/dL  Glucose, capillary  Result Value Ref Range   Glucose-Capillary 198 (H) 70 - 99 mg/dL  Glucose, capillary  Result Value Ref Range   Glucose-Capillary 222 (H) 70 - 99 mg/dL  Glucose, capillary  Result Value Ref Range   Glucose-Capillary 301 (H) 70 - 99 mg/dL  ABO/Rh  Result Value Ref Range   ABO/RH(D)      O POS Performed at [facility], [place] [address] [place], [place] [address]       Assessment & Plan:   Problem List Items Addressed This Visit      Cardiovascular and Mediastinum   Essential hypertension    Under good control on current regimen. Continue current regimen. Continue to monitor. Call with any concerns. Refills given. Labs drawn today.       Relevant Medications   benazepril (LOTENSIN) 40 MG tablet   amLODipine (NORVASC) 5 MG tablet   atorvastatin (LIPITOR) 40 MG tablet   Other Relevant Orders   CBC w/ Out   CMP     Endocrine   Controlled type 2 diabetes mellitus without complication, without long-term current use of insulin ([place])    Doing better with A1c of 6.6- continue diet and exercise. Call with any concerns. Continue to monitor. Starting atorvastatin. Call with any concerns. Recheck 3 months.       Relevant Medications   benazepril (LOTENSIN) 40 MG tablet   atorvastatin (LIPITOR) 40 MG tablet   Other Relevant Orders   A1c   Microalbumin, Urine Waived     Genitourinary   BPH with obstruction/lower  urinary tract symptoms    Under good control on current regimen. Continue current regimen. Continue to monitor. Call with any concerns. Refills given. Labs drawn today.        Relevant Medications   tamsulosin (FLOMAX) 0.4 MG CAPS capsule   finasteride (PROSCAR) 5 MG tablet   Other Relevant Orders   PSA   Urinalysis, Routine w reflex microscopic      Other   Hypercholesteremia    Starting atorvastatin. Call with any concerns. Recheck 3 months.       Relevant Medications   benazepril (LOTENSIN) 40 MG tablet   amLODipine (NORVASC) 5 MG tablet   atorvastatin (LIPITOR) 40 MG tablet   Other Relevant Orders   Lipid Out    Other Visit Diagnoses    Routine general medical examination at a health care facility    -  Primary   Vaccines up to date. Screening labs checked today. Colonoscopy declined. Continue diet and exercise. Call with any concerns.    Encounter for hepatitis C screening test for low risk patient       Labs drawn today. Await results.    Relevant Orders   Hepatitis C Antibody       LABORATORY TESTING:  Health maintenance labs ordered today as discussed above.   The natural history of prostate cancer and ongoing controversy regarding screening and potential treatment outcomes of prostate cancer has been discussed with the patient. The meaning of a false positive PSA and a false negative PSA has been discussed. He indicates understanding of the limitations of this screening test and wishes to proceed with screening PSA testing.   IMMUNIZATIONS:   - Tdap: Tetanus vaccination status reviewed: last tetanus booster within 10 years. - Influenza: Up to date - Pneumovax: Administered today - Prevnar: Not applicable  SCREENING: - Colonoscopy: Refused  Discussed with patient purpose of the colonoscopy is to detect colon cancer at curable precancerous or early stages    PATIENT COUNSELING:    Sexuality: Discussed sexually transmitted diseases, partner selection, use of condoms, avoidance of unintended pregnancy  and contraceptive alternatives.   Advised to avoid cigarette smoking.  I discussed with the patient that most people either abstain from alcohol or drink within safe limits (<=14/week and <=4 drinks/occasion for males, <=7/weeks and <= 3 drinks/occasion for females) and that the risk for alcohol disorders and  other health effects rises proportionally with the number of drinks per week and how often a drinker exceeds daily limits.  Discussed cessation/primary prevention of drug use and availability of treatment for abuse.   Diet: Encouraged to adjust caloric intake to maintain  or achieve ideal body weight, to reduce intake of dietary saturated fat and total fat, to limit sodium intake by avoiding high sodium foods and not adding table salt, and to maintain adequate dietary potassium and calcium preferably from fresh fruits, vegetables, and low-fat dairy products.    stressed the importance of regular exercise  Injury prevention: Discussed safety belts, safety helmets, smoke detector, smoking near bedding or upholstery.   Dental health: Discussed importance of regular tooth brushing, flossing, and dental visits.   Follow up plan: NEXT PREVENTATIVE PHYSICAL DUE IN 1 YEAR. Return in about 3 months (around 09/14/2019) for DM/Cholesterol visit.

## 2019-06-16 NOTE — Assessment & Plan Note (Signed)
Doing better with A1c of 6.6- continue diet and exercise. Call with any concerns. Continue to monitor. Starting atorvastatin. Call with any concerns. Recheck 3 months.

## 2019-06-17 ENCOUNTER — Encounter: Payer: Self-pay | Admitting: Family Medicine

## 2019-06-17 LAB — LIPID PANEL W/O CHOL/HDL RATIO
Cholesterol, Total: 213 mg/dL — ABNORMAL HIGH (ref 100–199)
HDL: 44 mg/dL (ref >39)
LDL Chol Calc (NIH): 156 mg/dL — ABNORMAL HIGH (ref 0–99)
Triglycerides: 74 mg/dL (ref 0–149)
VLDL Cholesterol Cal: 13 mg/dL (ref 5–40)

## 2019-06-17 LAB — COMPREHENSIVE METABOLIC PANEL
ALT: 18 IU/L (ref 0–44)
AST: 16 IU/L (ref 0–40)
Albumin/Globulin Ratio: 1.8 (ref 1.2–2.2)
Albumin: 4.6 g/dL (ref 3.8–4.8)
Alkaline Phosphatase: 61 IU/L (ref 39–117)
BUN/Creatinine Ratio: 17 (ref 10–24)
BUN: 16 mg/dL (ref 8–27)
Bilirubin Total: 1 mg/dL (ref 0.0–1.2)
CO2: 26 mmol/L (ref 20–29)
Calcium: 9.4 mg/dL (ref 8.6–10.2)
Chloride: 100 mmol/L (ref 96–106)
Creatinine, Ser: 0.93 mg/dL (ref 0.76–1.27)
GFR calc Af Amer: 101 mL/min/{1.73_m2} (ref >59)
GFR calc non Af Amer: 88 mL/min/{1.73_m2} (ref >59)
Globulin, Total: 2.5 g/dL (ref 1.5–4.5)
Glucose: 147 mg/dL — ABNORMAL HIGH (ref 65–99)
Potassium: 4.3 mmol/L (ref 3.5–5.2)
Sodium: 139 mmol/L (ref 134–144)
Total Protein: 7.1 g/dL (ref 6.0–8.5)

## 2019-06-17 LAB — CBC WITH DIFFERENTIAL/PLATELET
Basophils Absolute: 0.1 10*3/uL (ref 0.0–0.2)
Basos: 1 %
EOS (ABSOLUTE): 0.2 10*3/uL (ref 0.0–0.4)
Eos: 3 %
Hematocrit: 44.1 % (ref 37.5–51.0)
Hemoglobin: 14.7 g/dL (ref 13.0–17.7)
Immature Grans (Abs): 0 10*3/uL (ref 0.0–0.1)
Immature Granulocytes: 0 %
Lymphocytes Absolute: 1.7 10*3/uL (ref 0.7–3.1)
Lymphs: 29 %
MCH: 29.4 pg (ref 26.6–33.0)
MCHC: 33.3 g/dL (ref 31.5–35.7)
MCV: 88 fL (ref 79–97)
Monocytes Absolute: 0.4 10*3/uL (ref 0.1–0.9)
Monocytes: 8 %
Neutrophils Absolute: 3.4 10*3/uL (ref 1.4–7.0)
Neutrophils: 59 %
Platelets: 188 10*3/uL (ref 150–450)
RBC: 5 x10E6/uL (ref 4.14–5.80)
RDW: 12.4 % (ref 11.6–15.4)
WBC: 5.7 10*3/uL (ref 3.4–10.8)

## 2019-06-17 LAB — PSA: Prostate Specific Ag, Serum: 0.7 ng/mL (ref 0.0–4.0)

## 2019-06-17 LAB — HEPATITIS C ANTIBODY: Hep C Virus Ab: <0.1 s/co ratio (ref 0.0–0.9)

## 2019-07-14 ENCOUNTER — Other Ambulatory Visit: Payer: Self-pay | Admitting: Family Medicine

## 2019-07-14 MED ORDER — TAMSULOSIN HCL 0.4 MG PO CAPS: 0.4000 mg | ORAL_CAPSULE | Freq: Every day | ORAL | 1 refills | Status: DC

## 2019-07-14 NOTE — Telephone Encounter (Signed)
Copied from CRM [id number]. Topic: Quick Communication - Rx Refill/Question >> Jul 14, 2019  9:22 AM Dalphine Handing A wrote: Medication: tamsulosin (FLOMAX) 0.4 MG CAPS capsule (Patient stated that pharmacy has tried reaching out multiple times to get medication signed off on)  Has the patient contacted their pharmacy? Yes (Agent: If no, request that the patient contact the pharmacy for the refill.) (Agent: If yes, when and what did the pharmacy advise?)Contact PCP  Preferred Pharmacy (with phone number or street name): [facility] [phone] - [place], [place] - [address]  Phone:  [phone] Fax:  [phone]     Agent: Please be advised that RX refills may take up to 3 business days. We ask that you follow-up with your pharmacy.

## 2019-07-14 NOTE — Telephone Encounter (Signed)
Requested medication (s) are due for refill today: yes  Requested medication (s) are on the active medication list: yes  Future visit scheduled: yes  Notes to clinic:  Last ordered: 4 weeks ago by Historical Provider, MD Review for refill   Requested Prescriptions  Pending Prescriptions Disp Refills   tamsulosin (FLOMAX) 0.4 MG CAPS capsule 30 capsule     Sig: Take 1 capsule (0.4 mg total) by mouth.      Urology: Alpha-Adrenergic Blocker Passed - 07/14/2019  9:26 AM      Passed - Last BP in normal range    BP Readings from Last 1 Encounters:  06/16/19 112/71          Passed - Valid encounter within last 12 months    Recent Outpatient Visits           4 weeks ago Routine general medical examination at a health care facility   [facility], Megan P, DO   7 months ago Essential hypertension   [facility] Steele Sizer, MD   7 months ago COVID-19 virus infection   [facility], Laurel, DO   7 months ago COVID-19 virus infection   [facility], Redge Gainer, MD   8 months ago Viral upper respiratory tract infection   [facility] [place], Redge Gainer, MD       Future Appointments             In 2 months Johnson, Oralia Rud, DO [employer]

## 2019-07-14 NOTE — Telephone Encounter (Signed)
Routing to provider  

## 2019-07-14 NOTE — Telephone Encounter (Signed)
THis is not high priority.

## 2019-09-16 ENCOUNTER — Ambulatory Visit (INDEPENDENT_AMBULATORY_CARE_PROVIDER_SITE_OTHER): Payer: BC Managed Care – PPO | Admitting: Family Medicine

## 2019-09-16 ENCOUNTER — Other Ambulatory Visit: Payer: Self-pay

## 2019-09-16 ENCOUNTER — Encounter: Payer: Self-pay | Admitting: Family Medicine

## 2019-09-16 VITALS — BP 126/72 | HR 63 | Temp 97.7°F

## 2019-09-16 DIAGNOSIS — E7800 Pure hypercholesterolemia, unspecified: Secondary | ICD-10-CM | POA: Diagnosis not present

## 2019-09-16 DIAGNOSIS — E119 Type 2 diabetes mellitus without complications: Secondary | ICD-10-CM | POA: Diagnosis not present

## 2019-09-16 LAB — BAYER DCA HB A1C WAIVED: HB A1C (BAYER DCA - WAIVED): 7.5 % — ABNORMAL HIGH (ref <7.0)

## 2019-09-16 NOTE — Assessment & Plan Note (Signed)
Under good control on current regimen. Continue current regimen. Continue to monitor. Call with any concerns. Labs drawn today.  

## 2019-09-16 NOTE — Progress Notes (Signed)
BP 126/72 (BP Location: Left Arm, Cuff Size: Normal)   Pulse 63   Temp 97.7 F (36.5 C) (Oral)   SpO2 99%    Subjective:    Patient ID: Scott Hester, male    DOB: 21-Oct-1955, 64 y.o.   MRN: [id number]  HPI: Scott Hester is a 64 y.o. male  Chief Complaint  Patient presents with  . Hyperlipidemia  . Diabetes   HYPERLIPIDEMIA Hyperlipidemia status: excellent compliance Satisfied with current treatment?  yes Side effects:  no Medication compliance: excellent compliance Past cholesterol meds: atorvastatin Supplements: none Aspirin:  no The 10-year ASCVD risk score Mikey Bussing DC Jr., et al., 2013) is: 23.7%   Values used to calculate the score:     Age: 44 years     Sex: Male     Is Non-Hispanic African American: No     Diabetic: Yes     Tobacco smoker: No     Systolic Blood Pressure: 762 mmHg     Is BP treated: Yes     HDL Cholesterol: 44 mg/dL     Total Cholesterol: 213 mg/dL Chest pain:  no Coronary artery disease:  no  DIABETES Hypoglycemic episodes:yes Polydipsia/polyuria: no Visual disturbance: no Chest pain: no Paresthesias: no Glucose Monitoring: no  Accucheck frequency: Not Checking Taking Insulin?: no Blood Pressure Monitoring: not checking Retinal Examination: Not up to Date Foot Exam: Up to Date Diabetic Education: Completed Pneumovax: Up to Date Influenza: Up to Date Aspirin: no   Relevant past medical, surgical, family and social history reviewed and updated as indicated. Interim medical history since our last visit reviewed. Allergies and medications reviewed and updated.  Review of Systems  Constitutional: Negative.   Respiratory: Negative.   Cardiovascular: Negative.   Gastrointestinal: Negative.   Musculoskeletal: Negative.   Neurological: Negative.   Psychiatric/Behavioral: Negative.     Per HPI unless specifically indicated above     Objective:    BP 126/72 (BP Location: Left Arm, Cuff Size: Normal)   Pulse 63   Temp 97.7 F  (36.5 C) (Oral)   SpO2 99%   Wt Readings from Last 3 Encounters:  06/16/19 199 lb 8 oz (90.5 kg)  12/06/18 197 lb (89.4 kg)  11/14/18 189 lb 15.9 oz (86.2 kg)    Physical Exam Vitals and nursing note reviewed.  Constitutional:      General: He is not in acute distress.    Appearance: Normal appearance. He is not ill-appearing, toxic-appearing or diaphoretic.  HENT:     Head: Normocephalic and atraumatic.     Right Ear: External ear normal.     Left Ear: External ear normal.     Nose: Nose normal.     Mouth/Throat:     Mouth: Mucous membranes are moist.     Pharynx: Oropharynx is clear.  Eyes:     General: No scleral icterus.       Right eye: No discharge.        Left eye: No discharge.     Extraocular Movements: Extraocular movements intact.     Conjunctiva/sclera: Conjunctivae normal.     Pupils: Pupils are equal, round, and reactive to light.  Cardiovascular:     Rate and Rhythm: Normal rate and regular rhythm.     Pulses: Normal pulses.     Heart sounds: Normal heart sounds. No murmur. No friction rub. No gallop.   Pulmonary:     Effort: Pulmonary effort is normal. No respiratory distress.     Breath  sounds: Normal breath sounds. No stridor. No wheezing, rhonchi or rales.  Chest:     Chest wall: No tenderness.  Musculoskeletal:        General: Normal range of motion.     Cervical back: Normal range of motion and neck supple.  Skin:    General: Skin is warm and dry.     Capillary Refill: Capillary refill takes less than 2 seconds.     Coloration: Skin is not jaundiced or pale.     Findings: No bruising, erythema, lesion or rash.  Neurological:     General: No focal deficit present.     Mental Status: He is alert and oriented to person, place, and time. Mental status is at baseline.  Psychiatric:        Mood and Affect: Mood normal.        Behavior: Behavior normal.        Thought Content: Thought content normal.        Judgment: Judgment normal.     Results  for orders placed or performed in visit on 06/16/19  CBC w/ Out  Result Value Ref Range   WBC 5.7 3.4 - 10.8 x10E3/uL   RBC 5.00 4.14 - 5.80 x10E6/uL   Hemoglobin 14.7 13.0 - 17.7 g/dL   Hematocrit 81.8 56.3 - 51.0 %   MCV 88 79 - 97 fL   MCH 29.4 26.6 - 33.0 pg   MCHC 33.3 31.5 - 35.7 g/dL   RDW 14.9 70.2 - 63.7 %   Platelets 188 150 - 450 x10E3/uL   Neutrophils 59 Not Estab. %   Lymphs 29 Not Estab. %   Monocytes 8 Not Estab. %   Eos 3 Not Estab. %   Basos 1 Not Estab. %   Neutrophils Absolute 3.4 1.4 - 7.0 x10E3/uL   Lymphocytes Absolute 1.7 0.7 - 3.1 x10E3/uL   Monocytes Absolute 0.4 0.1 - 0.9 x10E3/uL   EOS (ABSOLUTE) 0.2 0.0 - 0.4 x10E3/uL   Basophils Absolute 0.1 0.0 - 0.2 x10E3/uL   Immature Granulocytes 0 Not Estab. %   Immature Grans (Abs) 0.0 0.0 - 0.1 x10E3/uL  CMP  Result Value Ref Range   Glucose 147 (H) 65 - 99 mg/dL   BUN 16 8 - 27 mg/dL   Creatinine, Ser 8.58 0.76 - 1.27 mg/dL   GFR calc non Af Amer 88 >59 mL/min/1.73   GFR calc Af Amer 101 >59 mL/min/1.73   BUN/Creatinine Ratio 17 10 - 24   Sodium 139 134 - 144 mmol/L   Potassium 4.3 3.5 - 5.2 mmol/L   Chloride 100 96 - 106 mmol/L   CO2 26 20 - 29 mmol/L   Calcium 9.4 8.6 - 10.2 mg/dL   Total Protein 7.1 6.0 - 8.5 g/dL   Albumin 4.6 3.8 - 4.8 g/dL   Globulin, Total 2.5 1.5 - 4.5 g/dL   Albumin/Globulin Ratio 1.8 1.2 - 2.2   Bilirubin Total 1.0 0.0 - 1.2 mg/dL   Alkaline Phosphatase 61 39 - 117 IU/L   AST 16 0 - 40 IU/L   ALT 18 0 - 44 IU/L  Lipid Out  Result Value Ref Range   Cholesterol, Total 213 (H) 100 - 199 mg/dL   Triglycerides 74 0 - 149 mg/dL   HDL 44 >85 mg/dL   VLDL Cholesterol Cal 13 5 - 40 mg/dL   LDL Chol Calc (NIH) 027 (H) 0 - 99 mg/dL  PSA  Result Value Ref Range   Prostate Specific Ag,  Serum 0.7 0.0 - 4.0 ng/mL  Urinalysis, Routine w reflex microscopic  Result Value Ref Range   Specific Gravity, UA 1.015 1.005 - 1.030   pH, UA 6.0 5.0 - 7.5   Color, UA Yellow Yellow    Appearance Ur Clear Clear   Leukocytes,UA Negative Negative   Protein,UA Negative Negative/Trace   Glucose, UA Negative Negative   Ketones, UA Negative Negative   RBC, UA Negative Negative   Bilirubin, UA Negative Negative   Urobilinogen, Ur 0.2 0.2 - 1.0 mg/dL   Nitrite, UA Negative Negative  A1c  Result Value Ref Range   HB A1C (BAYER DCA - WAIVED) 6.6 <7.0 %  Hepatitis C Antibody  Result Value Ref Range   Hep C Virus Ab <0.1 0.0 - 0.9 s/co ratio  Microalbumin, Urine Waived  Result Value Ref Range   Microalb, Ur Waived 10 0 - 19 mg/L   Creatinine, Urine Waived 50 10 - 300 mg/dL   Microalb/Creat Ratio 30-300 (H) <30 mg/g      Assessment & Plan:   Problem List Items Addressed This Visit      Endocrine   Controlled type 2 diabetes mellitus without complication, without long-term current use of insulin (HCC) - Primary    Not under good control with A1c 7.5 up from 6.6- will work on diet and exercise and recheck 3 months. Call with any concerns.       Relevant Orders   Comprehensive metabolic panel   Bayer DCA Hb N9G Waived     Other   Hypercholesteremia    Under good control on current regimen. Continue current regimen. Continue to monitor. Call with any concerns. Labs drawn today.       Relevant Orders   Comprehensive metabolic panel   Lipid Panel w/o Chol/HDL Ratio       Follow up plan: Return in about 3 months (around 12/17/2019) for 6 month follow up.

## 2019-09-16 NOTE — Patient Instructions (Signed)
.  We are recommending the vaccine to everyone who has not had an allergic reaction to any of the components of the vaccine. If you have specific questions about the vaccine, please bring them up with your health care provider to discuss them.   We will likely not be getting the vaccine in the office for the first rounds of vaccinations. The way they are releasing the vaccines is going to be through the health systems (like [place] [facility] [place], [facility], [facility], [facility]), through your county health department, or through the pharmacies.   The [facility] Department is giving vaccines to those 65+ and Health Care Workers Teachers and Child Care providers start 09/03/19, Essential Workers 09/17/19, People with pre-existing conditions 10/01/19 Call [phone] to schedule  If you are 65+ you can get a vaccine through [facility] by signing up for an appointment.  You can sign up by going to: [email or web address].  You can get more information by going to: [email or web address]  [employer] next door is giving the [employer]- you can call [phone] or stop by there to schedule.

## 2019-09-16 NOTE — Assessment & Plan Note (Signed)
Not under good control with A1c 7.5 up from 6.6- will work on diet and exercise and recheck 3 months. Call with any concerns.

## 2019-09-17 ENCOUNTER — Encounter: Payer: Self-pay | Admitting: Family Medicine

## 2019-09-17 LAB — COMPREHENSIVE METABOLIC PANEL
ALT: 35 IU/L (ref 0–44)
AST: 22 IU/L (ref 0–40)
Albumin/Globulin Ratio: 1.7 (ref 1.2–2.2)
Albumin: 4.3 g/dL (ref 3.8–4.8)
Alkaline Phosphatase: 63 IU/L (ref 39–117)
BUN/Creatinine Ratio: 18 (ref 10–24)
BUN: 15 mg/dL (ref 8–27)
Bilirubin Total: 0.9 mg/dL (ref 0.0–1.2)
CO2: 29 mmol/L (ref 20–29)
Calcium: 9.7 mg/dL (ref 8.6–10.2)
Chloride: 100 mmol/L (ref 96–106)
Creatinine, Ser: 0.85 mg/dL (ref 0.76–1.27)
GFR calc Af Amer: 108 mL/min/{1.73_m2} (ref >59)
GFR calc non Af Amer: 93 mL/min/{1.73_m2} (ref >59)
Globulin, Total: 2.5 g/dL (ref 1.5–4.5)
Glucose: 208 mg/dL — ABNORMAL HIGH (ref 65–99)
Potassium: 4.5 mmol/L (ref 3.5–5.2)
Sodium: 141 mmol/L (ref 134–144)
Total Protein: 6.8 g/dL (ref 6.0–8.5)

## 2019-09-17 LAB — LIPID PANEL W/O CHOL/HDL RATIO
Cholesterol, Total: 126 mg/dL (ref 100–199)
HDL: 40 mg/dL (ref >39)
LDL Chol Calc (NIH): 67 mg/dL (ref 0–99)
Triglycerides: 98 mg/dL (ref 0–149)
VLDL Cholesterol Cal: 19 mg/dL (ref 5–40)

## 2019-10-13 ENCOUNTER — Ambulatory Visit: Payer: Self-pay | Admitting: *Deleted

## 2019-10-13 NOTE — Telephone Encounter (Addendum)
Per intital request, "Pt called stating that he received the shot on Saturday. Pt states that after the shot he had a fever, chills, body aches, and nausea. "; contacted pt and he states he had his 1st dose of Moderna vaccine 10/10/19 at 1000; his symptoms started about 1700; the pt says his temp was 101.2 and after taking Extra Strength Tylenol x 2 tablets, this morning his temp is 97.5 orally; recommendations made per nurse triage protocol; he verbalized understanding; the pt sees Dr Olevia Perches, [facility]; will rote to office for notification of encounter.  Reason for Disposition . COVID-19 vaccine, systemic reactions (e.g., fatigue, fever, muscle aches), questions about  Answer Assessment - Initial Assessment Questions 1. MAIN CONCERN OR SYMPTOM:  "What is your main concern right now?" "What question do you have?" "What's the main symptom you're worried about?" (e.g., fever, pain, redness, swelling)     Fever 2. VACCINE: "What vaccination did you receive?" "Is this your first or second shot?" (e.g., none; Moderna, Pfizer, other)     Moderna 1st dose at 1000 on 10/11/19 3. SYMPTOM ONSET: "When did the begin?" (e.g., not relevant; hours, days)     10/11/19 4. SYMPTOM SEVERITY: "How bad is it?"       5. FEVER: "Is there a fever?" If so, ask: "What is it, how was it measured, and when did it start?"      101.2 oral orally 6. PAST REACTIONS: "Have you reacted to immunizations before?" If so, ask: "What happened?"     no 7. OTHER SYMPTOMS: "Do you have any other symptoms?"     Chills, body aches, nausea  Protocols used: CORONAVIRUS (COVID-19) VACCINE QUESTIONS AND REACTIONS-A-AH

## 2019-10-21 ENCOUNTER — Ambulatory Visit (INDEPENDENT_AMBULATORY_CARE_PROVIDER_SITE_OTHER): Payer: BLUE CROSS/BLUE SHIELD | Admitting: Family Medicine

## 2019-10-21 ENCOUNTER — Other Ambulatory Visit: Payer: Self-pay

## 2019-10-21 ENCOUNTER — Encounter: Payer: Self-pay | Admitting: Family Medicine

## 2019-10-21 VITALS — BP 144/74 | HR 60 | Temp 97.6°F | Ht 69.59 in | Wt 203.2 lb

## 2019-10-21 DIAGNOSIS — G8929 Other chronic pain: Secondary | ICD-10-CM | POA: Diagnosis not present

## 2019-10-21 DIAGNOSIS — M25561 Pain in right knee: Secondary | ICD-10-CM

## 2019-10-21 DIAGNOSIS — L918 Other hypertrophic disorders of the skin: Secondary | ICD-10-CM | POA: Diagnosis not present

## 2019-10-21 MED ORDER — NAPROXEN 500 MG PO TABS: 500.0000 mg | ORAL_TABLET | Freq: Two times a day (BID) | ORAL | 1 refills | Status: DC

## 2019-10-21 NOTE — Progress Notes (Signed)
BP (!) 144/74 (BP Location: Left Arm, Patient Position: Sitting, Cuff Size: Normal)   Pulse 60   Temp 97.6 F (36.4 C) (Oral)   Ht 5' 9.59" (1.768 m)   Wt 203 lb 3.2 oz (92.2 kg)   SpO2 99%   BMI 29.50 kg/m    Subjective:    Patient ID: Scott Hester, male    DOB: 01-Jan-1956, 64 y.o.   MRN: [id number]  HPI: Scott Hester is a 64 y.o. male  Chief Complaint  Patient presents with  . Knee Pain    right  . Nevus    under right arm   KNEE PAIN Duration: chronic- a lot worse in the last month Involved knee: right Mechanism of injury: unknown Location:medial Onset: gradual Severity: moderate  Quality:  Stiff and sore Frequency: with work Radiation: yes into his hip Aggravating factors: walking and working   Alleviating factors:tylenol, rub on pain reliever   Status: better Treatments attempted: APAP  Relief with NSAIDs?:  No NSAIDs Taken Weakness with weight bearing or walking: no Sensation of giving way: no Locking: no Popping: no Bruising: no Swelling: yes- 1x gone now Redness: no Paresthesias/decreased sensation: no Fevers: no  SKIN LESION- has a spot under his R arm that keeps getting irritated and bleeding Duration: months Location: under R arm Painful: no Itching: no Onset: gradual Context: not changing History of skin cancer: no History of precancerous skin lesions: no Family history of skin cancer: no  Relevant past medical, surgical, family and social history reviewed and updated as indicated. Interim medical history since our last visit reviewed. Allergies and medications reviewed and updated.  Review of Systems  Constitutional: Negative.   Respiratory: Negative.   Cardiovascular: Negative.   Musculoskeletal: Negative.   Skin: Negative.   Psychiatric/Behavioral: Negative.     Per HPI unless specifically indicated above     Objective:    BP (!) 144/74 (BP Location: Left Arm, Patient Position: Sitting, Cuff Size: Normal)   Pulse 60    Temp 97.6 F (36.4 C) (Oral)   Ht 5' 9.59" (1.768 m)   Wt 203 lb 3.2 oz (92.2 kg)   SpO2 99%   BMI 29.50 kg/m   Wt Readings from Last 3 Encounters:  10/21/19 203 lb 3.2 oz (92.2 kg)  06/16/19 199 lb 8 oz (90.5 kg)  12/06/18 197 lb (89.4 kg)    Physical Exam Vitals and nursing note reviewed.  Constitutional:      General: He is not in acute distress.    Appearance: Normal appearance. He is not ill-appearing, toxic-appearing or diaphoretic.  HENT:     Head: Normocephalic and atraumatic.     Right Ear: External ear normal.     Left Ear: External ear normal.     Nose: Nose normal.     Mouth/Throat:     Mouth: Mucous membranes are moist.     Pharynx: Oropharynx is clear.  Eyes:     General: No scleral icterus.       Right eye: No discharge.        Left eye: No discharge.     Extraocular Movements: Extraocular movements intact.     Conjunctiva/sclera: Conjunctivae normal.     Pupils: Pupils are equal, round, and reactive to light.  Cardiovascular:     Rate and Rhythm: Normal rate and regular rhythm.     Pulses: Normal pulses.     Heart sounds: Normal heart sounds. No murmur. No friction rub. No gallop.  Pulmonary:     Effort: Pulmonary effort is normal. No respiratory distress.     Breath sounds: Normal breath sounds. No stridor. No wheezing, rhonchi or rales.  Chest:     Chest wall: No tenderness.  Musculoskeletal:        General: No swelling, tenderness, deformity or signs of injury. Normal range of motion.     Cervical back: Normal range of motion and neck supple.     Right lower leg: No edema.     Left lower leg: No edema.  Skin:    General: Skin is warm and dry.     Capillary Refill: Capillary refill takes less than 2 seconds.     Coloration: Skin is not jaundiced or pale.     Findings: No bruising, erythema, lesion or rash.  Neurological:     General: No focal deficit present.     Mental Status: He is alert and oriented to person, place, and time. Mental status  is at baseline.  Psychiatric:        Mood and Affect: Mood normal.        Behavior: Behavior normal.        Thought Content: Thought content normal.        Judgment: Judgment normal.     Results for orders placed or performed in visit on 09/16/19  Comprehensive metabolic panel  Result Value Ref Range   Glucose 208 (H) 65 - 99 mg/dL   BUN 15 8 - 27 mg/dL   Creatinine, Ser 0.85 0.76 - 1.27 mg/dL   GFR calc non Af Amer 93 >59 mL/min/1.73   GFR calc Af Amer 108 >59 mL/min/1.73   BUN/Creatinine Ratio 18 10 - 24   Sodium 141 134 - 144 mmol/L   Potassium 4.5 3.5 - 5.2 mmol/L   Chloride 100 96 - 106 mmol/L   CO2 29 20 - 29 mmol/L   Calcium 9.7 8.6 - 10.2 mg/dL   Total Protein 6.8 6.0 - 8.5 g/dL   Albumin 4.3 3.8 - 4.8 g/dL   Globulin, Total 2.5 1.5 - 4.5 g/dL   Albumin/Globulin Ratio 1.7 1.2 - 2.2   Bilirubin Total 0.9 0.0 - 1.2 mg/dL   Alkaline Phosphatase 63 39 - 117 IU/L   AST 22 0 - 40 IU/L   ALT 35 0 - 44 IU/L  Lipid Panel w/o Chol/HDL Ratio  Result Value Ref Range   Cholesterol, Total 126 100 - 199 mg/dL   Triglycerides 98 0 - 149 mg/dL   HDL 40 >39 mg/dL   VLDL Cholesterol Cal 19 5 - 40 mg/dL   LDL Chol Calc (NIH) 67 0 - 99 mg/dL  Bayer DCA Hb A1c Waived  Result Value Ref Range   HB A1C (BAYER DCA - WAIVED) 7.5 (H) <7.0 %      Assessment & Plan:   Problem List Items Addressed This Visit    None    Visit Diagnoses    Chronic pain of right knee    -  Primary   Likely arthritis. Will start naproxen and check x-ray. Recheck 2-4 weeks. Call with any concerns. Continue to monitor.    Relevant Medications   naproxen (NAPROSYN) 500 MG tablet   Other Relevant Orders   DG Knee Complete 4 Views Right   Skin tag       Removed today as below.       Skin Procedure  Procedure: Informed consent given.  Sterile prep of the area.  Area infiltrated with  lidocaine with epinephrine.  Using a cautery, skin tag shaved off.  Pt ed on scarring     Diagnosis:   ICD-10-CM   1.  Chronic pain of right knee  M25.561 DG Knee Complete 4 Views Right   G89.29    Likely arthritis. Will start naproxen and check x-ray. Recheck 2-4 weeks. Call with any concerns. Continue to monitor.   2. Skin tag  L91.8    Removed today as below.     Lesion Location/Size: 2cm skin tag under R arm Physician: MJ Consent:  Risks, benefits, and alternative treatments discussed and all questions were answered.  Patient elected to proceed and verbal consent obtained.  Description: Area prepped and draped using semi-sterile technique. Area locally anesthetized using 3 cc's of lidocaine 2% with epi. Lesion removed using electrocautery. Wound dressed after application of bacitracin ointment.  Post Procedure Instructions:  Wound care instructions discussed and patient was instructed to keep area clean and dry.  Signs and symptoms of infection discussed, patient agrees to contact the office ASAP should they occur.  Dressing change recommended PRN.   Follow up plan: Return in about 4 weeks (around 11/18/2019) for follow up knee pain.

## 2019-10-23 ENCOUNTER — Telehealth: Payer: Self-pay | Admitting: Family Medicine

## 2019-10-23 NOTE — Telephone Encounter (Signed)
Pt called office for xray results/ please advise

## 2019-10-23 NOTE — Telephone Encounter (Signed)
Pt asked if you can call him @ [phone]

## 2019-10-23 NOTE — Telephone Encounter (Signed)
Patient states that his wife called he missed a call regarding his knee x ray. Please advise CB- [phone]

## 2019-10-23 NOTE — Telephone Encounter (Signed)
Knee x-ray report handed to me by Dr. Laural Benes with message stating x-ray normal. Result given to the patient.

## 2019-11-13 ENCOUNTER — Encounter: Payer: Self-pay | Admitting: Family Medicine

## 2019-12-25 ENCOUNTER — Ambulatory Visit: Payer: BC Managed Care – PPO | Admitting: Family Medicine

## 2019-12-29 ENCOUNTER — Other Ambulatory Visit: Payer: Self-pay

## 2019-12-29 ENCOUNTER — Ambulatory Visit (INDEPENDENT_AMBULATORY_CARE_PROVIDER_SITE_OTHER): Payer: 59 | Admitting: Family Medicine

## 2019-12-29 ENCOUNTER — Encounter: Payer: Self-pay | Admitting: Family Medicine

## 2019-12-29 VITALS — BP 119/73 | HR 64 | Temp 98.7°F | Wt 194.2 lb

## 2019-12-29 DIAGNOSIS — E119 Type 2 diabetes mellitus without complications: Secondary | ICD-10-CM | POA: Diagnosis not present

## 2019-12-29 DIAGNOSIS — N138 Other obstructive and reflux uropathy: Secondary | ICD-10-CM

## 2019-12-29 DIAGNOSIS — N401 Enlarged prostate with lower urinary tract symptoms: Secondary | ICD-10-CM

## 2019-12-29 DIAGNOSIS — I10 Essential (primary) hypertension: Secondary | ICD-10-CM | POA: Diagnosis not present

## 2019-12-29 LAB — BAYER DCA HB A1C WAIVED: HB A1C (BAYER DCA - WAIVED): 7.6 % — ABNORMAL HIGH (ref <7.0)

## 2019-12-29 MED ORDER — FINASTERIDE 5 MG PO TABS: 5.0000 mg | ORAL_TABLET | Freq: Every day | ORAL | 1 refills | Status: DC

## 2019-12-29 MED ORDER — AMLODIPINE BESYLATE 5 MG PO TABS: 5.0000 mg | ORAL_TABLET | Freq: Every day | ORAL | 1 refills | Status: DC

## 2019-12-29 MED ORDER — TAMSULOSIN HCL 0.4 MG PO CAPS: 0.4000 mg | ORAL_CAPSULE | Freq: Every day | ORAL | 1 refills | Status: DC

## 2019-12-29 MED ORDER — BENAZEPRIL HCL 40 MG PO TABS: 40.0000 mg | ORAL_TABLET | Freq: Every day | ORAL | 1 refills | Status: DC

## 2019-12-29 MED ORDER — ATORVASTATIN CALCIUM 40 MG PO TABS: 40.0000 mg | ORAL_TABLET | Freq: Every day | ORAL | 1 refills | Status: DC

## 2019-12-29 MED ORDER — METFORMIN HCL ER 750 MG PO TB24: 750.0000 mg | ORAL_TABLET | Freq: Every day | ORAL | 3 refills | Status: DC

## 2019-12-29 NOTE — Assessment & Plan Note (Signed)
Still running high with A1c of 7.6. Will start metformin and recheck 1 month. Call with any concerns.

## 2019-12-29 NOTE — Progress Notes (Signed)
BP 119/73   Pulse 64   Temp 98.7 F (37.1 C)   Wt 194 lb 4 oz (88.1 kg)   SpO2 100%   BMI 28.20 kg/m    Subjective:    Patient ID: Scott Hester, male    DOB: 1955/10/11, 64 y.o.   MRN: [id number]  HPI: Scott Hester is a 64 y.o. male  Chief Complaint  Patient presents with  . Diabetes   DIABETES Hypoglycemic episodes:no Polydipsia/polyuria: no Visual disturbance: no Chest pain: no Paresthesias: no Glucose Monitoring: no  Accucheck frequency: Not Checking Taking Insulin?: no Blood Pressure Monitoring: not checking Retinal Examination: Not up to Date Foot Exam: Up to Date Diabetic Education: Completed Pneumovax: Up to Date Influenza: Up to Date Aspirin: no  Relevant past medical, surgical, family and social history reviewed and updated as indicated. Interim medical history since our last visit reviewed. Allergies and medications reviewed and updated.  Review of Systems  Constitutional: Negative.   Respiratory: Negative.   Cardiovascular: Negative.   Skin: Negative.   Psychiatric/Behavioral: Negative.     Per HPI unless specifically indicated above     Objective:    BP 119/73   Pulse 64   Temp 98.7 F (37.1 C)   Wt 194 lb 4 oz (88.1 kg)   SpO2 100%   BMI 28.20 kg/m   Wt Readings from Last 3 Encounters:  12/29/19 194 lb 4 oz (88.1 kg)  10/21/19 203 lb 3.2 oz (92.2 kg)  06/16/19 199 lb 8 oz (90.5 kg)    Physical Exam Vitals and nursing note reviewed.  Constitutional:      General: He is not in acute distress.    Appearance: Normal appearance. He is not ill-appearing, toxic-appearing or diaphoretic.  HENT:     Head: Normocephalic and atraumatic.     Right Ear: External ear normal.     Left Ear: External ear normal.     Nose: Nose normal.     Mouth/Throat:     Mouth: Mucous membranes are moist.     Pharynx: Oropharynx is clear.  Eyes:     General: No scleral icterus.       Right eye: No discharge.        Left eye: No discharge.      Extraocular Movements: Extraocular movements intact.     Conjunctiva/sclera: Conjunctivae normal.     Pupils: Pupils are equal, round, and reactive to light.  Cardiovascular:     Rate and Rhythm: Normal rate and regular rhythm.     Pulses: Normal pulses.     Heart sounds: Normal heart sounds. No murmur heard.  No friction rub. No gallop.   Pulmonary:     Effort: Pulmonary effort is normal. No respiratory distress.     Breath sounds: Normal breath sounds. No stridor. No wheezing, rhonchi or rales.  Chest:     Chest wall: No tenderness.  Musculoskeletal:        General: Normal range of motion.     Cervical back: Normal range of motion and neck supple.  Skin:    General: Skin is warm and dry.     Capillary Refill: Capillary refill takes less than 2 seconds.     Coloration: Skin is not jaundiced or pale.     Findings: No bruising, erythema, lesion or rash.  Neurological:     General: No focal deficit present.     Mental Status: He is alert and oriented to person, place, and time. Mental status  is at baseline.  Psychiatric:        Mood and Affect: Mood normal.        Behavior: Behavior normal.        Thought Content: Thought content normal.        Judgment: Judgment normal.     Results for orders placed or performed in visit on 09/16/19  Comprehensive metabolic panel  Result Value Ref Range   Glucose 208 (H) 65 - 99 mg/dL   BUN 15 8 - 27 mg/dL   Creatinine, Ser 9.37 0.76 - 1.27 mg/dL   GFR calc non Af Amer 93 >59 mL/min/1.73   GFR calc Af Amer 108 >59 mL/min/1.73   BUN/Creatinine Ratio 18 10 - 24   Sodium 141 134 - 144 mmol/L   Potassium 4.5 3.5 - 5.2 mmol/L   Chloride 100 96 - 106 mmol/L   CO2 29 20 - 29 mmol/L   Calcium 9.7 8.6 - 10.2 mg/dL   Total Protein 6.8 6.0 - 8.5 g/dL   Albumin 4.3 3.8 - 4.8 g/dL   Globulin, Total 2.5 1.5 - 4.5 g/dL   Albumin/Globulin Ratio 1.7 1.2 - 2.2   Bilirubin Total 0.9 0.0 - 1.2 mg/dL   Alkaline Phosphatase 63 39 - 117 IU/L   AST 22 0 -  40 IU/L   ALT 35 0 - 44 IU/L  Lipid Panel w/o Chol/HDL Ratio  Result Value Ref Range   Cholesterol, Total 126 100 - 199 mg/dL   Triglycerides 98 0 - 149 mg/dL   HDL 40 >16 mg/dL   VLDL Cholesterol Cal 19 5 - 40 mg/dL   LDL Chol Calc (NIH) 67 0 - 99 mg/dL  Bayer DCA Hb R6V Waived  Result Value Ref Range   HB A1C (BAYER DCA - WAIVED) 7.5 (H) <7.0 %      Assessment & Plan:   Problem List Items Addressed This Visit      Cardiovascular and Mediastinum   Essential hypertension   Relevant Medications   atorvastatin (LIPITOR) 40 MG tablet   amLODipine (NORVASC) 5 MG tablet   benazepril (LOTENSIN) 40 MG tablet     Endocrine   Controlled type 2 diabetes mellitus without complication, without long-term current use of insulin (HCC) - Primary    Still running high with A1c of 7.6. Will start metformin and recheck 1 month. Call with any concerns.       Relevant Medications   metFORMIN (GLUCOPHAGE-XR) 750 MG 24 hr tablet   atorvastatin (LIPITOR) 40 MG tablet   benazepril (LOTENSIN) 40 MG tablet   Other Relevant Orders   Bayer DCA Hb A1c Waived     Genitourinary   BPH with obstruction/lower urinary tract symptoms   Relevant Medications   finasteride (PROSCAR) 5 MG tablet   tamsulosin (FLOMAX) 0.4 MG CAPS capsule       Follow up plan: Return in about 4 weeks (around 01/26/2020) for follow up metformin and atorvastatin.

## 2019-12-30 ENCOUNTER — Telehealth: Payer: Self-pay | Admitting: Family Medicine

## 2019-12-30 NOTE — Telephone Encounter (Signed)
Routing to provider to advise.  

## 2019-12-30 NOTE — Telephone Encounter (Signed)
Pharmacy called to ask the doctor to send a new script for patient's metFORMIN (GLUCOPHAGE-XR) 750 MG 24 hr tablet, because patient said the pills are too large for him to swallow.  Pharmacy wanted to know if it can be changed to the immediate release.  Please advise and call pharmacy to let them know at [phone]

## 2020-01-05 NOTE — Telephone Encounter (Signed)
Called and spoke with pt's wife Scott Hester (DPR reviewed) She stated patient was not home yet, that she will take the message. Message relayed. Wife stated that will let  the patient know and they will call back with any response.

## 2020-01-05 NOTE — Telephone Encounter (Signed)
Please ask him if he's OK with switching- the smaller pill may give him diarrhea

## 2020-01-07 NOTE — Telephone Encounter (Signed)
Called and spoke with patients wife, he is not sure if wants to change anything right now, I told her to let us know if there is anything that he needs from [place] before his next appt.

## 2020-01-27 ENCOUNTER — Other Ambulatory Visit: Payer: Self-pay

## 2020-01-27 ENCOUNTER — Encounter: Payer: Self-pay | Admitting: Family Medicine

## 2020-01-27 ENCOUNTER — Ambulatory Visit (INDEPENDENT_AMBULATORY_CARE_PROVIDER_SITE_OTHER): Payer: 59 | Admitting: Family Medicine

## 2020-01-27 VITALS — BP 128/71 | HR 64 | Temp 98.0°F | Wt 194.0 lb

## 2020-01-27 DIAGNOSIS — E7800 Pure hypercholesterolemia, unspecified: Secondary | ICD-10-CM | POA: Diagnosis not present

## 2020-01-27 DIAGNOSIS — E119 Type 2 diabetes mellitus without complications: Secondary | ICD-10-CM | POA: Diagnosis not present

## 2020-01-27 MED ORDER — METFORMIN HCL ER 750 MG PO TB24: 750.0000 mg | ORAL_TABLET | Freq: Every day | ORAL | 1 refills | Status: DC

## 2020-01-27 NOTE — Assessment & Plan Note (Addendum)
Tolerating his metformin well. No concerns. Checking CMP today. Call with any concerns. Refills given.

## 2020-01-27 NOTE — Assessment & Plan Note (Signed)
Tolerating atorvastatin well. Rechecking labs today. Refills up to date. Call with any concerns.

## 2020-01-27 NOTE — Progress Notes (Signed)
BP 128/71 (BP Location: Left Arm, Patient Position: Sitting, Cuff Size: Normal)   Pulse 64   Temp 98 F (36.7 C) (Oral)   Wt 194 lb (88 kg)   SpO2 100%   BMI 28.17 kg/m    Subjective:    Patient ID: Scott Hester, male    DOB: 24-Jul-1955, 64 y.o.   MRN: [id number]  HPI: SADRAC ZEOLI is a 64 y.o. male  Chief Complaint  Patient presents with  . Hyperlipidemia  . Diabetes   HYPERLIPIDEMIA Hyperlipidemia status: stable Satisfied with current treatment?  yes Side effects:  no Medication compliance: excellent compliance Past cholesterol meds: atorvastatin Supplements: none Aspirin:  yes The ASCVD Risk score Denman George DC Jr., et al., 2013) failed to calculate for the following reasons:   The valid total cholesterol range is 130 to 320 mg/dL Chest pain:  no Coronary artery disease:  no  DIABETES Hypoglycemic episodes:no Polydipsia/polyuria: no Visual disturbance: no Chest pain: no Paresthesias: no Glucose Monitoring: yes  Accucheck frequency: rarely Taking Insulin?: no Blood Pressure Monitoring: not checking Retinal Examination: Up to Date Foot Exam: Up to Date Diabetic Education: Completed Pneumovax: Up to Date Influenza: Up to Date Aspirin: no   Relevant past medical, surgical, family and social history reviewed and updated as indicated. Interim medical history since our last visit reviewed. Allergies and medications reviewed and updated.  Review of Systems  Constitutional: Negative.   Respiratory: Negative.   Cardiovascular: Negative.   Gastrointestinal: Negative.   Musculoskeletal: Negative.   Psychiatric/Behavioral: Negative.     Per HPI unless specifically indicated above     Objective:    BP 128/71 (BP Location: Left Arm, Patient Position: Sitting, Cuff Size: Normal)   Pulse 64   Temp 98 F (36.7 C) (Oral)   Wt 194 lb (88 kg)   SpO2 100%   BMI 28.17 kg/m   Wt Readings from Last 3 Encounters:  01/27/20 194 lb (88 kg)  12/29/19 194 lb 4 oz  (88.1 kg)  10/21/19 203 lb 3.2 oz (92.2 kg)    Physical Exam Vitals and nursing note reviewed.  Constitutional:      General: He is not in acute distress.    Appearance: Normal appearance. He is not ill-appearing, toxic-appearing or diaphoretic.  HENT:     Head: Normocephalic and atraumatic.     Right Ear: External ear normal.     Left Ear: External ear normal.     Nose: Nose normal.     Mouth/Throat:     Mouth: Mucous membranes are moist.     Pharynx: Oropharynx is clear.  Eyes:     General: No scleral icterus.       Right eye: No discharge.        Left eye: No discharge.     Extraocular Movements: Extraocular movements intact.     Conjunctiva/sclera: Conjunctivae normal.     Pupils: Pupils are equal, round, and reactive to light.  Cardiovascular:     Rate and Rhythm: Normal rate and regular rhythm.     Pulses: Normal pulses.     Heart sounds: Normal heart sounds. No murmur heard.  No friction rub. No gallop.   Pulmonary:     Effort: Pulmonary effort is normal. No respiratory distress.     Breath sounds: Normal breath sounds. No stridor. No wheezing, rhonchi or rales.  Chest:     Chest wall: No tenderness.  Musculoskeletal:        General: Normal range of motion.  Cervical back: Normal range of motion and neck supple.  Skin:    General: Skin is warm and dry.     Capillary Refill: Capillary refill takes less than 2 seconds.     Coloration: Skin is not jaundiced or pale.     Findings: No bruising, erythema, lesion or rash.  Neurological:     General: No focal deficit present.     Mental Status: He is alert and oriented to person, place, and time. Mental status is at baseline.  Psychiatric:        Mood and Affect: Mood normal.        Behavior: Behavior normal.        Thought Content: Thought content normal.        Judgment: Judgment normal.     Results for orders placed or performed in visit on 12/29/19  Bayer DCA Hb A1c Waived  Result Value Ref Range   HB A1C  (BAYER DCA - WAIVED) 7.6 (H) <7.0 %      Assessment & Plan:   Problem List Items Addressed This Visit      Endocrine   Controlled type 2 diabetes mellitus without complication, without long-term current use of insulin (HCC) - Primary    Tolerating his metformin well. No concerns. Checking CMP today. Call with any concerns.      Relevant Medications   metFORMIN (GLUCOPHAGE-XR) 750 MG 24 hr tablet   Other Relevant Orders   Comprehensive metabolic panel     Other   Hypercholesteremia    Tolerating atorvastatin well. Rechecking labs today. Refills up to date. Call with any concerns.       Relevant Orders   Comprehensive metabolic panel   Lipid Panel w/o Chol/HDL Ratio       Follow up plan: Return in about 2 months (around 03/29/2020).

## 2020-01-28 LAB — COMPREHENSIVE METABOLIC PANEL
ALT: 27 IU/L (ref 0–44)
AST: 17 IU/L (ref 0–40)
Albumin/Globulin Ratio: 1.8 (ref 1.2–2.2)
Albumin: 4.4 g/dL (ref 3.8–4.8)
Alkaline Phosphatase: 55 IU/L (ref 48–121)
BUN/Creatinine Ratio: 19 (ref 10–24)
BUN: 16 mg/dL (ref 8–27)
Bilirubin Total: 0.9 mg/dL (ref 0.0–1.2)
CO2: 25 mmol/L (ref 20–29)
Calcium: 9.4 mg/dL (ref 8.6–10.2)
Chloride: 102 mmol/L (ref 96–106)
Creatinine, Ser: 0.83 mg/dL (ref 0.76–1.27)
GFR calc Af Amer: 108 mL/min/{1.73_m2} (ref >59)
GFR calc non Af Amer: 94 mL/min/{1.73_m2} (ref >59)
Globulin, Total: 2.4 g/dL (ref 1.5–4.5)
Glucose: 165 mg/dL — ABNORMAL HIGH (ref 65–99)
Potassium: 4.8 mmol/L (ref 3.5–5.2)
Sodium: 138 mmol/L (ref 134–144)
Total Protein: 6.8 g/dL (ref 6.0–8.5)

## 2020-01-28 LAB — LIPID PANEL W/O CHOL/HDL RATIO
Cholesterol, Total: 121 mg/dL (ref 100–199)
HDL: 41 mg/dL (ref >39)
LDL Chol Calc (NIH): 63 mg/dL (ref 0–99)
Triglycerides: 89 mg/dL (ref 0–149)
VLDL Cholesterol Cal: 17 mg/dL (ref 5–40)

## 2020-01-30 ENCOUNTER — Encounter: Payer: Self-pay | Admitting: Family Medicine

## 2020-03-30 ENCOUNTER — Ambulatory Visit (INDEPENDENT_AMBULATORY_CARE_PROVIDER_SITE_OTHER): Payer: 59 | Admitting: Family Medicine

## 2020-03-30 ENCOUNTER — Encounter: Payer: Self-pay | Admitting: Family Medicine

## 2020-03-30 ENCOUNTER — Other Ambulatory Visit: Payer: Self-pay

## 2020-03-30 VITALS — BP 144/69 | HR 61 | Temp 97.6°F | Wt 191.0 lb

## 2020-03-30 DIAGNOSIS — E119 Type 2 diabetes mellitus without complications: Secondary | ICD-10-CM | POA: Diagnosis not present

## 2020-03-30 DIAGNOSIS — Z23 Encounter for immunization: Secondary | ICD-10-CM

## 2020-03-30 LAB — BAYER DCA HB A1C WAIVED: HB A1C (BAYER DCA - WAIVED): 6.6 % (ref <7.0)

## 2020-03-30 NOTE — Assessment & Plan Note (Signed)
Doing much better with A1c of 6.6. Continue diet and exercise and current regimen. Call with any concerns.

## 2020-03-30 NOTE — Progress Notes (Signed)
BP (!) 144/69   Pulse 61   Temp 97.6 F (36.4 C) (Oral)   Wt 191 lb (86.6 kg)   SpO2 100%   BMI 27.73 kg/m    Subjective:    Patient ID: Scott Hester, male    DOB: [birth year], 64 y.o.   MRN: [id number]  HPI: Scott Hester is a 64 y.o. male  Chief Complaint  Patient presents with  . Diabetes   DIABETES Hypoglycemic episodes:no Polydipsia/polyuria: no Visual disturbance: no Chest pain: no Paresthesias: no Glucose Monitoring: no  Accucheck frequency: Not Checking Taking Insulin?: no Blood Pressure Monitoring: not checking Retinal Examination: in 2 weeks with Dr. Janett Billow Exam: Up to Date Diabetic Education: Completed Pneumovax: Up to Date Influenza: Up to Date Aspirin: yes  Relevant past medical, surgical, family and social history reviewed and updated as indicated. Interim medical history since our last visit reviewed. Allergies and medications reviewed and updated.  Review of Systems  Constitutional: Negative.   Respiratory: Negative.   Cardiovascular: Negative.   Gastrointestinal: Negative.   Musculoskeletal: Negative.   Psychiatric/Behavioral: Negative.     Per HPI unless specifically indicated above     Objective:    BP (!) 144/69   Pulse 61   Temp 97.6 F (36.4 C) (Oral)   Wt 191 lb (86.6 kg)   SpO2 100%   BMI 27.73 kg/m   Wt Readings from Last 3 Encounters:  03/30/20 191 lb (86.6 kg)  01/27/20 194 lb (88 kg)  12/29/19 194 lb 4 oz (88.1 kg)    Physical Exam Vitals and nursing note reviewed.  Constitutional:      General: He is not in acute distress.    Appearance: Normal appearance. He is not ill-appearing, toxic-appearing or diaphoretic.  HENT:     Head: Normocephalic and atraumatic.     Right Ear: External ear normal.     Left Ear: External ear normal.     Nose: Nose normal.     Mouth/Throat:     Mouth: Mucous membranes are moist.     Pharynx: Oropharynx is clear.  Eyes:     General: No scleral icterus.       Right eye:  No discharge.        Left eye: No discharge.     Extraocular Movements: Extraocular movements intact.     Conjunctiva/sclera: Conjunctivae normal.     Pupils: Pupils are equal, round, and reactive to light.  Cardiovascular:     Rate and Rhythm: Normal rate and regular rhythm.     Pulses: Normal pulses.     Heart sounds: Normal heart sounds. No murmur heard.  No friction rub. No gallop.   Pulmonary:     Effort: Pulmonary effort is normal. No respiratory distress.     Breath sounds: Normal breath sounds. No stridor. No wheezing, rhonchi or rales.  Chest:     Chest wall: No tenderness.  Musculoskeletal:        General: Normal range of motion.     Cervical back: Normal range of motion and neck supple.  Skin:    General: Skin is warm and dry.     Capillary Refill: Capillary refill takes less than 2 seconds.     Coloration: Skin is not jaundiced or pale.     Findings: No bruising, erythema, lesion or rash.  Neurological:     General: No focal deficit present.     Mental Status: He is alert and oriented to person, place, and time.  Mental status is at baseline.  Psychiatric:        Mood and Affect: Mood normal.        Behavior: Behavior normal.        Thought Content: Thought content normal.        Judgment: Judgment normal.     Results for orders placed or performed in visit on 01/27/20  Comprehensive metabolic panel  Result Value Ref Range   Glucose 165 (H) 65 - 99 mg/dL   BUN 16 8 - 27 mg/dL   Creatinine, Ser 9.16 0.76 - 1.27 mg/dL   GFR calc non Af Amer 94 >59 mL/min/1.73   GFR calc Af Amer 108 >59 mL/min/1.73   BUN/Creatinine Ratio 19 10 - 24   Sodium 138 134 - 144 mmol/L   Potassium 4.8 3.5 - 5.2 mmol/L   Chloride 102 96 - 106 mmol/L   CO2 25 20 - 29 mmol/L   Calcium 9.4 8.6 - 10.2 mg/dL   Total Protein 6.8 6.0 - 8.5 g/dL   Albumin 4.4 3.8 - 4.8 g/dL   Globulin, Total 2.4 1.5 - 4.5 g/dL   Albumin/Globulin Ratio 1.8 1.2 - 2.2   Bilirubin Total 0.9 0.0 - 1.2 mg/dL    Alkaline Phosphatase 55 48 - 121 IU/L   AST 17 0 - 40 IU/L   ALT 27 0 - 44 IU/L  Lipid Panel w/o Chol/HDL Ratio  Result Value Ref Range   Cholesterol, Total 121 100 - 199 mg/dL   Triglycerides 89 0 - 149 mg/dL   HDL 41 >38 mg/dL   VLDL Cholesterol Cal 17 5 - 40 mg/dL   LDL Chol Calc (NIH) 63 0 - 99 mg/dL      Assessment & Plan:   Problem List Items Addressed This Visit      Endocrine   Controlled type 2 diabetes mellitus without complication, without long-term current use of insulin (HCC) - Primary    Doing much better with A1c of 6.6. Continue diet and exercise and current regimen. Call with any concerns.       Relevant Orders   Bayer DCA Hb A1c Waived    Other Visit Diagnoses    Flu vaccine need       Flu shot given today.   Relevant Orders   Flu Vaccine QUAD 36+ mos IM (Completed)       Follow up plan: Return in about 3 months (around 06/29/2020) for Physical.

## 2020-04-15 LAB — HM DIABETES EYE EXAM

## 2020-04-23 ENCOUNTER — Other Ambulatory Visit: Payer: Self-pay | Admitting: Family Medicine

## 2020-04-23 NOTE — Telephone Encounter (Signed)
Requested Prescriptions  Pending Prescriptions Disp Refills   atorvastatin (LIPITOR) 40 MG tablet [Pharmacy Med Name: Atorvastatin Calcium Oral Tablet 40 MG] 90 tablet 3    Sig: TAKE ONE TABLET BY MOUTH DAILY     Cardiovascular:  Antilipid - Statins Failed - 04/23/2020 10:36 AM      Failed - LDL in normal range and within 360 days    LDL Chol Calc (NIH)  Date Value Ref Range Status  01/27/2020 63 0 - 99 mg/dL Final         Passed - Total Cholesterol in normal range and within 360 days    Cholesterol, Total  Date Value Ref Range Status  01/27/2020 121 100 - 199 mg/dL Final         Passed - HDL in normal range and within 360 days    HDL  Date Value Ref Range Status  01/27/2020 41 >39 mg/dL Final         Passed - Triglycerides in normal range and within 360 days    Triglycerides  Date Value Ref Range Status  01/27/2020 89 0 - 149 mg/dL Final         Passed - Patient is not pregnant      Passed - Valid encounter within last 12 months    Recent Outpatient Visits          3 weeks ago Controlled type 2 diabetes mellitus without complication, without long-term current use of insulin (HCC)   [facility] [place], Megan P, DO   2 months ago Controlled type 2 diabetes mellitus without complication, without long-term current use of insulin (HCC)   [facility] [place], Megan P, DO   3 months ago Controlled type 2 diabetes mellitus without complication, without long-term current use of insulin (HCC)   [facility] [place], Megan P, DO   6 months ago Chronic pain of right knee   [facility] [place], Megan P, DO   7 months ago Controlled type 2 diabetes mellitus without complication, without long-term current use of insulin (HCC)   [facility] [place], [place], DO      Future Appointments            In 2 months Johnson, Oralia Rud, DO [facility]

## 2020-04-25 ENCOUNTER — Other Ambulatory Visit: Payer: Self-pay | Admitting: Family Medicine

## 2020-04-26 ENCOUNTER — Other Ambulatory Visit: Payer: Self-pay

## 2020-04-26 NOTE — Telephone Encounter (Signed)
Sent when the e-scribe was down. Please resend to the pharmacy.

## 2020-04-26 NOTE — Telephone Encounter (Signed)
Copied from CRM [id number]. Topic: General - Inquiry >> Apr 26, 2020  2:14 PM Adrian Prince D wrote: Reason for CRM: [facility] called about a prescription for atorvastatin that they didn't receive and when I checked on it, it didn't go through. Please resend the prescription from 04-23-2020 to [facility]. Please advise

## 2020-04-27 MED ORDER — ATORVASTATIN CALCIUM 40 MG PO TABS: 40.0000 mg | ORAL_TABLET | Freq: Every day | ORAL | 1 refills | Status: DC

## 2020-06-24 ENCOUNTER — Other Ambulatory Visit: Payer: Self-pay

## 2020-06-24 ENCOUNTER — Encounter: Payer: Self-pay | Admitting: Emergency Medicine

## 2020-06-24 ENCOUNTER — Ambulatory Visit: Payer: Self-pay

## 2020-06-24 ENCOUNTER — Ambulatory Visit
Admission: EM | Admit: 2020-06-24 | Discharge: 2020-06-24 | Disposition: A | Discharge status: Home / Self Care | Payer: 59 | Attending: Physician Assistant | Admitting: Physician Assistant

## 2020-06-24 DIAGNOSIS — Z20822 Contact with and (suspected) exposure to covid-19: Secondary | ICD-10-CM | POA: Insufficient documentation

## 2020-06-24 DIAGNOSIS — K529 Noninfective gastroenteritis and colitis, unspecified: Secondary | ICD-10-CM | POA: Insufficient documentation

## 2020-06-24 LAB — COMPREHENSIVE METABOLIC PANEL
ALT: 34 U/L (ref 0–44)
AST: 23 U/L (ref 15–41)
Albumin: 4.1 g/dL (ref 3.5–5.0)
Alkaline Phosphatase: 45 U/L (ref 38–126)
Anion gap: 9 (ref 5–15)
BUN: 18 mg/dL (ref 8–23)
CO2: 25 mmol/L (ref 22–32)
Calcium: 8.4 mg/dL — ABNORMAL LOW (ref 8.9–10.3)
Chloride: 104 mmol/L (ref 98–111)
Creatinine, Ser: 0.84 mg/dL (ref 0.61–1.24)
GFR, Estimated: >60 mL/min (ref >60)
Glucose, Bld: 162 mg/dL — ABNORMAL HIGH (ref 70–99)
Potassium: 3.9 mmol/L (ref 3.5–5.1)
Sodium: 138 mmol/L (ref 135–145)
Total Bilirubin: 0.8 mg/dL (ref 0.3–1.2)
Total Protein: 7.7 g/dL (ref 6.5–8.1)

## 2020-06-24 LAB — RESP PANEL BY RT-PCR (FLU A&B, COVID) ARPGX2
Influenza A by PCR: NEGATIVE
Influenza B by PCR: NEGATIVE
SARS Coronavirus 2 by RT PCR: NEGATIVE

## 2020-06-24 NOTE — Telephone Encounter (Signed)
Patient called stating that Tuesday he came home from work vomiting  He states that he vomited into Wednesday when diarrhea started. He states vomiting has stopped but he fill fast and feels bloated. He has 10-15 watery stool per day. He does not have pain just sore and firm abdomin from vomiting. He feels weak his lips are dry. He is passing urine  He states it is not dark or concentrated. Per protocol patient will go to UC for evaluation. Care advice read to patient. He verbalized understanding.  Reason for Disposition . [1] SEVERE diarrhea (e.g., 7 or more times / day more than normal) AND [2] age > 60 years  Answer Assessment - Initial Assessment Questions 1. DIARRHEA SEVERITY: "How bad is the diarrhea?" "How many extra stools have you had in the past 24 hours than normal?"    - NO DIARRHEA (SCALE 0)   - MILD (SCALE 1-3): Few loose or mushy BMs; increase of 1-3 stools over normal daily number of stools; mild increase in ostomy output.   -  MODERATE (SCALE 4-7): Increase of 4-6 stools daily over normal; moderate increase in ostomy output. * SEVERE (SCALE 8-10; OR 'WORST POSSIBLE'): Increase of 7 or more stools daily over normal; moderate increase in ostomy output; incontinence.    severe 2. ONSET: "When did the diarrhea begin?"      Tuesday night diarrhea wednesday 3. BM CONSISTENCY: "How loose or watery is the diarrhea?"      watery 4. VOMITING: "Are you also vomiting?" If Yes, ask: "How many times in the past 24 hours?"      Yes Tuesday and Wednesday 5. ABDOMINAL PAIN: "Are you having any abdominal pain?" If Yes, ask: "What does it feel like?" (e.g., crampy, dull, intermittent, constant)      Fill up 6. ABDOMINAL PAIN SEVERITY: If present, ask: "How bad is the pain?"  (e.g., Scale 1-10; mild, moderate, or severe)   - MILD (1-3): doesn't interfere with normal activities, abdomen soft and not tender to touch    - MODERATE (4-7): interferes with normal activities or awakens from sleep, tender  to touch    - SEVERE (8-10): excruciating pain, doubled over, unable to do any normal activities      Sore stomach and sides feels hard 7. ORAL INTAKE: If vomiting, "Have you been able to drink liquids?" "How much fluids have you had in the past 24 hours?"     [place] 8. HYDRATION: "Any signs of dehydration?" (e.g., dry mouth [not just dry lips], too weak to stand, dizziness, new weight loss) "When did you last urinate?"    Weak, no dizziness dry lips 9. EXPOSURE: "Have you traveled to a foreign country recently?" "Have you been exposed to anyone with diarrhea?" "Could you have eaten any food that was spoiled?"    No 10. ANTIBIOTIC USE: "Are you taking antibiotics now or have you taken antibiotics in the past 2 months?"     no 11. OTHER SYMPTOMS: "Do you have any other symptoms?" (e.g., fever, blood in stool)      none 12. PREGNANCY: "Is there any chance you are pregnant?" "When was your last menstrual period?"      N/A  Protocols used: DIARRHEA-A-AH

## 2020-06-24 NOTE — ED Provider Notes (Signed)
[facility]    CSN: [id number] Arrival date & time: 06/24/20  1649      History   Chief Complaint Chief Complaint  Patient presents with  . Emesis  . Diarrhea    HPI Scott Hester is a 64 y.o. male who presents due to onset of N/V/D yesterday.  Vomited x 9 the first 24h Diarrhea x 15+ times in the bast 16h. Took pepto today around noon. Denies blood in the stool. Denies being on antibiotics in the past month, or beeing out of the country.   Denies a fever  Has been trying to eat regular foods and not bland diet.      Past Medical History:  Diagnosis Date  . BPH (benign prostatic hyperplasia)   . Hypertension     Patient Active Problem List   Diagnosis Date Noted  . Hypercholesteremia 12/11/2018  . Controlled type 2 diabetes mellitus without complication, without long-term current use of insulin (HCC) 05/22/2017  . Essential hypertension 04/15/2015  . BPH with obstruction/lower urinary tract symptoms 04/15/2015    History reviewed. No pertinent surgical history.     Home Medications    Prior to Admission medications   Medication Sig Start Date End Date Taking? Authorizing Provider  acetaminophen (TYLENOL) 325 MG tablet Take 650 mg by mouth every 6 (six) hours as needed.   Yes [provider]  amLODipine (NORVASC) 5 MG tablet Take 1 tablet (5 mg total) by mouth daily. 12/29/19  Yes Johnson, Megan P, DO  atorvastatin (LIPITOR) 40 MG tablet Take 1 tablet (40 mg total) by mouth daily. 04/27/20  Yes Johnson, Megan P, DO  benazepril (LOTENSIN) 40 MG tablet Take 1 tablet (40 mg total) by mouth daily. 12/29/19  Yes Johnson, Megan P, DO  finasteride (PROSCAR) 5 MG tablet Take 1 tablet (5 mg total) by mouth daily. 12/29/19  Yes Johnson, Megan P, DO  metFORMIN (GLUCOPHAGE-XR) 750 MG 24 hr tablet Take 1 tablet (750 mg total) by mouth daily with breakfast. 01/27/20  Yes Johnson, Megan P, DO  tamsulosin (FLOMAX) 0.4 MG CAPS capsule Take 1 capsule (0.4 mg  total) by mouth daily. 12/29/19  Yes Dorcas Carrow, DO    Family History Family History  Problem Relation Age of Onset  . Hypertension Mother   . Hypertension Father   . Diabetes Father   . Hypertension Maternal Grandmother   . Diabetes Maternal Grandmother     Social History Social History   Tobacco Use  . Smoking status: Never Smoker  . Smokeless tobacco: Never Used  Vaping Use  . Vaping Use: Never used  Substance Use Topics  . Alcohol use: No  . Drug use: No     Allergies   Patient has no known allergies.   Review of Systems Review of Systems  Constitutional: Positive for appetite change. Negative for activity change, chills, diaphoresis, fatigue and fever.  HENT: Negative for congestion, ear discharge, ear pain, postnasal drip, rhinorrhea and sore throat.   Eyes: Negative for discharge.  Respiratory: Negative for cough and chest tightness.   Cardiovascular: Negative for chest pain.  Gastrointestinal: Positive for diarrhea. Negative for abdominal pain, nausea and vomiting.  Genitourinary: Negative for difficulty urinating.  Musculoskeletal: Negative for myalgias.  Skin: Negative for rash.  Neurological: Positive for light-headedness. Negative for dizziness, weakness and headaches.       Gets lightheaded at time when he stand quick  Hematological: Negative for adenopathy.     Physical Exam Triage Vital Signs  ED Triage Vitals  Enc Vitals Group     BP 06/24/20 1755 (!) 141/79     Pulse Rate 06/24/20 1755 62     Resp 06/24/20 1755 18     Temp 06/24/20 1755 98.2 F (36.8 C)     Temp Source 06/24/20 1755 Oral     SpO2 06/24/20 1755 100 %     Weight 06/24/20 1753 190 lb 14.7 oz (86.6 kg)     Height 06/24/20 1753 5' 9.5" (1.765 m)     Head Circumference --      Peak Flow --      Pain Score 06/24/20 1753 0     Pain Loc --      Pain Edu? --      Excl. in GC? --    No data found.  Updated Vital Signs BP (!) 141/79 (BP Location: Left Arm)   Pulse 62    Temp 98.2 F (36.8 C) (Oral)   Resp 18   Ht 5' 9.5" (1.765 m)   Wt 190 lb 14.7 oz (86.6 kg)   SpO2 100%   BMI 27.79 kg/m   Visual Acuity Right Eye Distance:   Left Eye Distance:   Bilateral Distance:    Right Eye Near:   Left Eye Near:    Bilateral Near:     Physical Exam Constitutional:      General: He is not in acute distress.    Appearance: Normal appearance. He is not toxic-appearing.  HENT:     Right Ear: External ear normal.     Left Ear: External ear normal.     Mouth/Throat:     Mouth: Mucous membranes are moist.  Eyes:     General: No scleral icterus.    Conjunctiva/sclera: Conjunctivae normal.  Cardiovascular:     Rate and Rhythm: Normal rate and regular rhythm.     Heart sounds: No murmur heard.   Pulmonary:     Effort: Pulmonary effort is normal.     Breath sounds: Normal breath sounds.  Abdominal:     General: Abdomen is flat. Bowel sounds are normal.     Palpations: Abdomen is soft.     Tenderness: There is no abdominal tenderness. There is no guarding or rebound.  Musculoskeletal:        General: Normal range of motion.  Skin:    General: Skin is warm and dry.     Findings: No rash.  Neurological:     Mental Status: He is alert and oriented to person, place, and time.     Gait: Gait normal.  Psychiatric:        Mood and Affect: Mood normal.        Behavior: Behavior normal.        Thought Content: Thought content normal.        Judgment: Judgment normal.      UC Treatments / Results  Labs (all labs ordered are listed, but only abnormal results are displayed) Labs Reviewed  RESP PANEL BY RT-PCR (FLU A&B, COVID) [id number]    EKG   Radiology No results found.  Procedures Procedures (including critical care time)  Medications Ordered in UC Medications - No data to display  Initial Impression / Assessment and Plan / UC Course  I have reviewed the triage vital signs and the nursing notes. Gastroenteriting with resolved emesis, but  persistent diarrhea Pertinent labs & imaging results that were available during my care of the patient were reviewed by me and  considered in my medical decision making (see chart for details).  Final Clinical Impressions(s) / UC Diagnoses   Final diagnoses:  None   Discharge Instructions   None    ED Prescriptions    None     PDMP not reviewed this encounter.   Garey Ham, Cordelia Poche 06/24/20 1908

## 2020-06-24 NOTE — ED Triage Notes (Signed)
Pt c/o nausea, vomiting and diarrhea. Started about a day ago. He states the vomiting has stopped but diarrhea has continued. Denies fever.

## 2020-06-24 NOTE — Telephone Encounter (Signed)
Noted and agree with plan for UC

## 2020-06-24 NOTE — Discharge Instructions (Signed)
Your covid and flu test are normal Your potasium is normal Avoid meats and regular meal.  Stay on BRAT diet( bananas, rice, apple sauce, toast) for 48h You may continue pepto for the diarhea Avoid dairy while having the diarrhea, til the stools are formed.  Drink Gatorade or Sports drink- clear or yellow color If symptoms persists in the next 24 h and you get weaker, you need to be re-examined and need to go to the ER.

## 2020-07-09 ENCOUNTER — Other Ambulatory Visit: Payer: Self-pay | Admitting: Family Medicine

## 2020-07-09 DIAGNOSIS — I10 Essential (primary) hypertension: Secondary | ICD-10-CM

## 2020-07-11 NOTE — Telephone Encounter (Signed)
Requested Prescriptions  Pending Prescriptions Disp Refills  . amLODipine (NORVASC) 5 MG tablet [Pharmacy Med Name: amLODIPine Besylate Oral Tablet 5 MG] 90 tablet 0    Sig: TAKE ONE TABLET BY MOUTH DAILY     Cardiovascular:  Calcium Channel Blockers Failed - 07/09/2020 10:18 AM      Failed - Last BP in normal range    BP Readings from Last 1 Encounters:  06/24/20 (!) 141/79         Passed - Valid encounter within last 6 months    Recent Outpatient Visits          3 months ago Controlled type 2 diabetes mellitus without complication, without long-term current use of insulin (HCC)   [facility] [place], Megan P, DO   5 months ago Controlled type 2 diabetes mellitus without complication, without long-term current use of insulin (HCC)   [facility] [place], Megan P, DO   6 months ago Controlled type 2 diabetes mellitus without complication, without long-term current use of insulin (HCC)   [facility] [place], Megan P, DO   8 months ago Chronic pain of right knee   [facility] [place], Megan P, DO   9 months ago Controlled type 2 diabetes mellitus without complication, without long-term current use of insulin (HCC)   [facility] [place], [place], DO      Future Appointments            In 4 days Laural Benes, Oralia Rud, DO [facility]

## 2020-07-15 ENCOUNTER — Encounter: Payer: Self-pay | Admitting: Family Medicine

## 2020-07-15 ENCOUNTER — Other Ambulatory Visit: Payer: Self-pay

## 2020-07-15 ENCOUNTER — Ambulatory Visit (INDEPENDENT_AMBULATORY_CARE_PROVIDER_SITE_OTHER): Payer: 59 | Admitting: Family Medicine

## 2020-07-15 VITALS — BP 128/72 | HR 63 | Temp 97.4°F | Ht 69.0 in | Wt 192.8 lb

## 2020-07-15 DIAGNOSIS — E119 Type 2 diabetes mellitus without complications: Secondary | ICD-10-CM | POA: Diagnosis not present

## 2020-07-15 DIAGNOSIS — N401 Enlarged prostate with lower urinary tract symptoms: Secondary | ICD-10-CM

## 2020-07-15 DIAGNOSIS — E7800 Pure hypercholesterolemia, unspecified: Secondary | ICD-10-CM

## 2020-07-15 DIAGNOSIS — Z0000 Encounter for general adult medical examination without abnormal findings: Secondary | ICD-10-CM

## 2020-07-15 DIAGNOSIS — I10 Essential (primary) hypertension: Secondary | ICD-10-CM | POA: Diagnosis not present

## 2020-07-15 DIAGNOSIS — N138 Other obstructive and reflux uropathy: Secondary | ICD-10-CM

## 2020-07-15 LAB — MICROALBUMIN, URINE WAIVED
Creatinine, Urine Waived: 100 mg/dL (ref 10–300)
Microalb, Ur Waived: 80 mg/L — ABNORMAL HIGH (ref 0–19)

## 2020-07-15 LAB — BAYER DCA HB A1C WAIVED: HB A1C (BAYER DCA - WAIVED): 6.7 % (ref <7.0)

## 2020-07-15 MED ORDER — FINASTERIDE 5 MG PO TABS: 5.0000 mg | ORAL_TABLET | Freq: Every day | ORAL | 1 refills | Status: DC

## 2020-07-15 MED ORDER — ATORVASTATIN CALCIUM 40 MG PO TABS: 40.0000 mg | ORAL_TABLET | Freq: Every day | ORAL | 1 refills | Status: DC

## 2020-07-15 MED ORDER — METFORMIN HCL ER 750 MG PO TB24: 750.0000 mg | ORAL_TABLET | Freq: Every day | ORAL | 1 refills | Status: DC

## 2020-07-15 MED ORDER — BENAZEPRIL HCL 40 MG PO TABS: 40.0000 mg | ORAL_TABLET | Freq: Every day | ORAL | 1 refills | Status: DC

## 2020-07-15 MED ORDER — TAMSULOSIN HCL 0.4 MG PO CAPS: 0.4000 mg | ORAL_CAPSULE | Freq: Every day | ORAL | 1 refills | Status: DC

## 2020-07-15 MED ORDER — AMLODIPINE BESYLATE 5 MG PO TABS: 5.0000 mg | ORAL_TABLET | Freq: Every day | ORAL | 1 refills | Status: DC

## 2020-07-15 NOTE — Assessment & Plan Note (Signed)
Under good control on current regimen. Continue current regimen. Continue to monitor. Call with any concerns. Refills given. Labs drawn today.   

## 2020-07-15 NOTE — Assessment & Plan Note (Signed)
Doing well with A1c of 6.7. Continue current regimen. Continue to monitor. Call with any concerns. Refills given.

## 2020-07-15 NOTE — Patient Instructions (Signed)

## 2020-07-15 NOTE — Progress Notes (Signed)
BP 128/72   Pulse 63   Temp (!) 97.4 F (36.3 C)   Ht 5\' 9"  (1.753 m)   Wt 192 lb 12.8 oz (87.5 kg)   SpO2 98%   BMI 28.47 kg/m    Subjective:    Patient ID: Scott Hester, male    DOB: [birth year], 65 y.o.   MRN: [id number]  HPI: Scott Hester is a 65 y.o. male presenting on 07/15/2020 for comprehensive medical examination. Current medical complaints include:  HYPERTENSION / HYPERLIPIDEMIA Satisfied with current treatment? yes Duration of hypertension: chronic BP monitoring frequency: not checking BP medication side effects: no Past BP meds: benazepril, amlodipine Duration of hyperlipidemia: chronic Cholesterol medication side effects: no Cholesterol supplements: none Past cholesterol medications: atorvastatin Medication compliance: excellent compliance Aspirin: yes Recent stressors: no Recurrent headaches: no Visual changes: no Palpitations: no Dyspnea: no Chest pain: no Lower extremity edema: no Dizzy/lightheaded: no  DIABETES Hypoglycemic episodes:no Polydipsia/polyuria: no Visual disturbance: no Chest pain: no Paresthesias: no Glucose Monitoring: no  Accucheck frequency: Not Checking Taking Insulin?: no Blood Pressure Monitoring: not checking Retinal Examination: Up to Date Foot Exam: Up to Date Diabetic Education: Completed Pneumovax: Up to Date Influenza: Up to Date Aspirin: yes  BPH BPH status: controlled Satisfied with current treatment?: yes Medication side effects: no Medication compliance: excellent compliance Duration: chronic Nocturia: 1-2x per night Urinary frequency:no Incomplete voiding: no Urgency: no Weak urinary stream: no Straining to start stream: no Dysuria: no Onset: gradual Severity: mild  Interim Problems from his last visit: no  Depression Screen done today and results listed below:  Depression screen [facility] 2/9 07/15/2020 06/16/2019 05/08/2018 10/31/2017 05/02/2017  Decreased Interest 0 0 0 0 0  Down, Depressed,  Hopeless 0 0 0 0 0  PHQ - 2 Score 0 0 0 0 0    Past Medical History:  Past Medical History:  Diagnosis Date  . BPH (benign prostatic hyperplasia)   . Hypertension     Surgical History:  History reviewed. No pertinent surgical history.  Medications:  Current Outpatient Medications on File Prior to Visit  Medication Sig  . acetaminophen (TYLENOL) 325 MG tablet Take 650 mg by mouth every 6 (six) hours as needed. (Patient not taking: Reported on 07/15/2020)   No current facility-administered medications on file prior to visit.    Allergies:  No Known Allergies  Social History:  Social History   Socioeconomic History  . Marital status: Single    Spouse name: Not on file  . Number of children: Not on file  . Years of education: Not on file  . Highest education level: Not on file  Occupational History  . Not on file  Tobacco Use  . Smoking status: Never Smoker  . Smokeless tobacco: Never Used  Vaping Use  . Vaping Use: Never used  Substance and Sexual Activity  . Alcohol use: No  . Drug use: No  . Sexual activity: Yes  Other Topics Concern  . Not on file  Social History Narrative  . Not on file   Social Determinants of Health   Financial Resource Strain: Not on file  Food Insecurity: Not on file  Transportation Needs: Not on file  Physical Activity: Not on file  Stress: Not on file  Social Connections: Not on file  Intimate Partner Violence: Not on file   Social History   Tobacco Use  Smoking Status Never Smoker  Smokeless Tobacco Never Used   Social History   Substance and Sexual Activity  Alcohol Use No    Family History:  Family History  Problem Relation Age of Onset  . Hypertension Mother   . Hypertension Father   . Diabetes Father   . Hypertension Maternal Grandmother   . Diabetes Maternal Grandmother     Past medical history, surgical history, medications, allergies, family history and social history reviewed with patient today and  changes made to appropriate areas of the chart.   Review of Systems  Constitutional: Negative.   HENT: Negative.   Eyes: Negative.   Respiratory: Negative.   Cardiovascular: Negative.   Gastrointestinal: Negative.   Genitourinary: Negative.   Musculoskeletal: Negative.   Skin: Negative.   Neurological: Negative.   Endo/Heme/Allergies: Negative.   Psychiatric/Behavioral: Negative.     All other ROS negative except what is listed above and in the HPI.      Objective:    BP 128/72   Pulse 63   Temp (!) 97.4 F (36.3 C)   Ht 5\' 9"  (1.753 m)   Wt 192 lb 12.8 oz (87.5 kg)   SpO2 98%   BMI 28.47 kg/m   Wt Readings from Last 3 Encounters:  07/15/20 192 lb 12.8 oz (87.5 kg)  06/24/20 190 lb 14.7 oz (86.6 kg)  03/30/20 191 lb (86.6 kg)    Physical Exam Vitals and nursing note reviewed.  Constitutional:      General: He is not in acute distress.    Appearance: Normal appearance. He is normal weight. He is not ill-appearing, toxic-appearing or diaphoretic.  HENT:     Head: Normocephalic and atraumatic.     Right Ear: Tympanic membrane, ear canal and external ear normal. There is no impacted cerumen.     Left Ear: Tympanic membrane, ear canal and external ear normal. There is no impacted cerumen.     Nose: Nose normal. No congestion or rhinorrhea.     Mouth/Throat:     Mouth: Mucous membranes are moist.     Pharynx: Oropharynx is clear. No oropharyngeal exudate or posterior oropharyngeal erythema.  Eyes:     General: No scleral icterus.       Right eye: No discharge.        Left eye: No discharge.     Extraocular Movements: Extraocular movements intact.     Conjunctiva/sclera: Conjunctivae normal.     Pupils: Pupils are equal, round, and reactive to light.  Neck:     Vascular: No carotid bruit.  Cardiovascular:     Rate and Rhythm: Normal rate and regular rhythm.     Pulses: Normal pulses.     Heart sounds: No murmur heard. No friction rub. No gallop.   Pulmonary:      Effort: Pulmonary effort is normal. No respiratory distress.     Breath sounds: Normal breath sounds. No stridor. No wheezing, rhonchi or rales.  Chest:     Chest wall: No tenderness.  Abdominal:     General: Abdomen is flat. Bowel sounds are normal. There is no distension.     Palpations: Abdomen is soft. There is no mass.     Tenderness: There is no abdominal tenderness. There is no right CVA tenderness, left CVA tenderness, guarding or rebound.     Hernia: No hernia is present.  Genitourinary:    Comments: Genital exam deferred with shared decision making Musculoskeletal:        General: No swelling, tenderness, deformity or signs of injury.     Cervical back: Normal range of motion and neck supple.  No rigidity. No muscular tenderness.     Right lower leg: No edema.     Left lower leg: No edema.  Lymphadenopathy:     Cervical: No cervical adenopathy.  Skin:    General: Skin is warm and dry.     Capillary Refill: Capillary refill takes less than 2 seconds.     Coloration: Skin is not jaundiced or pale.     Findings: No bruising, erythema, lesion or rash.  Neurological:     General: No focal deficit present.     Mental Status: He is alert and oriented to person, place, and time.     Cranial Nerves: No cranial nerve deficit.     Sensory: No sensory deficit.     Motor: No weakness.     Coordination: Coordination normal.     Gait: Gait normal.     Deep Tendon Reflexes: Reflexes normal.  Psychiatric:        Mood and Affect: Mood normal.        Behavior: Behavior normal.        Thought Content: Thought content normal.        Judgment: Judgment normal.     Results for orders placed or performed during the hospital encounter of 06/24/20  Resp Panel by RT-PCR (Flu A&B, Covid) Nasopharyngeal Swab   Specimen: Nasopharyngeal Swab; Nasopharyngeal(NP) swabs in vial transport medium  Result Value Ref Range   SARS Coronavirus 2 by RT PCR NEGATIVE NEGATIVE   Influenza A by PCR  NEGATIVE NEGATIVE   Influenza B by PCR NEGATIVE NEGATIVE  Comprehensive metabolic panel  Result Value Ref Range   Sodium 138 135 - 145 mmol/L   Potassium 3.9 3.5 - 5.1 mmol/L   Chloride 104 98 - 111 mmol/L   CO2 25 22 - 32 mmol/L   Glucose, Bld 162 (H) 70 - 99 mg/dL   BUN 18 8 - 23 mg/dL   Creatinine, Ser 0.84 0.61 - 1.24 mg/dL   Calcium 8.4 (L) 8.9 - 10.3 mg/dL   Total Protein 7.7 6.5 - 8.1 g/dL   Albumin 4.1 3.5 - 5.0 g/dL   AST 23 15 - 41 U/L   ALT 34 0 - 44 U/L   Alkaline Phosphatase 45 38 - 126 U/L   Total Bilirubin 0.8 0.3 - 1.2 mg/dL   GFR, Estimated >60 >60 mL/min   Anion gap 9 5 - 15      Assessment & Plan:   Problem List Items Addressed This Visit      Cardiovascular and Mediastinum   Essential hypertension    Under good control on current regimen. Continue current regimen. Continue to monitor. Call with any concerns. Refills given. Labs drawn today.       Relevant Medications   benazepril (LOTENSIN) 40 MG tablet   atorvastatin (LIPITOR) 40 MG tablet   amLODipine (NORVASC) 5 MG tablet     Endocrine   Controlled type 2 diabetes mellitus without complication, without long-term current use of insulin (HCC)    Doing well with A1c of 6.7. Continue current regimen. Continue to monitor. Call with any concerns. Refills given.       Relevant Medications   metFORMIN (GLUCOPHAGE-XR) 750 MG 24 hr tablet   benazepril (LOTENSIN) 40 MG tablet   atorvastatin (LIPITOR) 40 MG tablet   Other Relevant Orders   Bayer DCA Hb A1c Waived   Comprehensive metabolic panel   Microalbumin, Urine Waived     Genitourinary   BPH with obstruction/lower urinary tract  symptoms    Under good control on current regimen. Continue current regimen. Continue to monitor. Call with any concerns. Refills given. Labs drawn today.       Relevant Medications   tamsulosin (FLOMAX) 0.4 MG CAPS capsule   finasteride (PROSCAR) 5 MG tablet   Other Relevant Orders   PSA     Other    Hypercholesteremia    Under good control on current regimen. Continue current regimen. Continue to monitor. Call with any concerns. Refills given. Labs drawn today.       Relevant Medications   benazepril (LOTENSIN) 40 MG tablet   atorvastatin (LIPITOR) 40 MG tablet   amLODipine (NORVASC) 5 MG tablet   Other Relevant Orders   Lipid Panel w/o Chol/HDL Ratio   Comprehensive metabolic panel   CBC with Differential/Platelet    Other Visit Diagnoses    Routine general medical examination at a health care facility    -  Primary   Vaccines up to date. Screening labs checked today. Colonoscopy declined. Continue diet and exercise. Call with any concerns.    Relevant Orders   Bayer DCA Hb A1c Waived   Lipid Panel w/o Chol/HDL Ratio   Comprehensive metabolic panel   Microalbumin, Urine Waived   CBC with Differential/Platelet   PSA       Discussed aspirin prophylaxis for myocardial infarction prevention and decision was made to continue ASA  LABORATORY TESTING:  Health maintenance labs ordered today as discussed above.   The natural history of prostate cancer and ongoing controversy regarding screening and potential treatment outcomes of prostate cancer has been discussed with the patient. The meaning of a false positive PSA and a false negative PSA has been discussed. He indicates understanding of the limitations of this screening test and wishes to proceed with screening PSA testing.   IMMUNIZATIONS:   - Tdap: Tetanus vaccination status reviewed: last tetanus booster within 10 years. - Influenza: Up to date - Pneumovax: Up to date - Prevnar: Not applicable - COVID: Up to date  SCREENING: - Colonoscopy: Refused  Discussed with patient purpose of the colonoscopy is to detect colon cancer at curable precancerous or early stages   PATIENT COUNSELING:    Sexuality: Discussed sexually transmitted diseases, partner selection, use of condoms, avoidance of unintended pregnancy  and  contraceptive alternatives.   Advised to avoid cigarette smoking.  I discussed with the patient that most people either abstain from alcohol or drink within safe limits (<=14/week and <=4 drinks/occasion for males, <=7/weeks and <= 3 drinks/occasion for females) and that the risk for alcohol disorders and other health effects rises proportionally with the number of drinks per week and how often a drinker exceeds daily limits.  Discussed cessation/primary prevention of drug use and availability of treatment for abuse.   Diet: Encouraged to adjust caloric intake to maintain  or achieve ideal body weight, to reduce intake of dietary saturated fat and total fat, to limit sodium intake by avoiding high sodium foods and not adding table salt, and to maintain adequate dietary potassium and calcium preferably from fresh fruits, vegetables, and low-fat dairy products.    stressed the importance of regular exercise  Injury prevention: Discussed safety belts, safety helmets, smoke detector, smoking near bedding or upholstery.   Dental health: Discussed importance of regular tooth brushing, flossing, and dental visits.   Follow up plan: NEXT PREVENTATIVE PHYSICAL DUE IN 1 YEAR. Return in about 6 months (around 01/12/2021).

## 2020-07-16 LAB — COMPREHENSIVE METABOLIC PANEL
ALT: 21 IU/L (ref 0–44)
AST: 15 IU/L (ref 0–40)
Albumin/Globulin Ratio: 1.8 (ref 1.2–2.2)
Albumin: 4.4 g/dL (ref 3.8–4.8)
Alkaline Phosphatase: 56 IU/L (ref 44–121)
BUN/Creatinine Ratio: 15 (ref 10–24)
BUN: 11 mg/dL (ref 8–27)
Bilirubin Total: 0.9 mg/dL (ref 0.0–1.2)
CO2: 27 mmol/L (ref 20–29)
Calcium: 9.3 mg/dL (ref 8.6–10.2)
Chloride: 103 mmol/L (ref 96–106)
Creatinine, Ser: 0.72 mg/dL — ABNORMAL LOW (ref 0.76–1.27)
GFR calc Af Amer: 115 mL/min/{1.73_m2} (ref >59)
GFR calc non Af Amer: 99 mL/min/{1.73_m2} (ref >59)
Globulin, Total: 2.5 g/dL (ref 1.5–4.5)
Glucose: 171 mg/dL — ABNORMAL HIGH (ref 65–99)
Potassium: 4.4 mmol/L (ref 3.5–5.2)
Sodium: 142 mmol/L (ref 134–144)
Total Protein: 6.9 g/dL (ref 6.0–8.5)

## 2020-07-16 LAB — CBC WITH DIFFERENTIAL/PLATELET
Basophils Absolute: 0.1 10*3/uL (ref 0.0–0.2)
Basos: 1 %
EOS (ABSOLUTE): 0.2 10*3/uL (ref 0.0–0.4)
Eos: 3 %
Hematocrit: 41.1 % (ref 37.5–51.0)
Hemoglobin: 14.1 g/dL (ref 13.0–17.7)
Immature Grans (Abs): 0 10*3/uL (ref 0.0–0.1)
Immature Granulocytes: 0 %
Lymphocytes Absolute: 1.6 10*3/uL (ref 0.7–3.1)
Lymphs: 34 %
MCH: 30.4 pg (ref 26.6–33.0)
MCHC: 34.3 g/dL (ref 31.5–35.7)
MCV: 89 fL (ref 79–97)
Monocytes Absolute: 0.4 10*3/uL (ref 0.1–0.9)
Monocytes: 9 %
Neutrophils Absolute: 2.6 10*3/uL (ref 1.4–7.0)
Neutrophils: 53 %
Platelets: 177 10*3/uL (ref 150–450)
RBC: 4.64 x10E6/uL (ref 4.14–5.80)
RDW: 12.1 % (ref 11.6–15.4)
WBC: 4.9 10*3/uL (ref 3.4–10.8)

## 2020-07-16 LAB — LIPID PANEL W/O CHOL/HDL RATIO
Cholesterol, Total: 125 mg/dL (ref 100–199)
HDL: 46 mg/dL (ref >39)
LDL Chol Calc (NIH): 66 mg/dL (ref 0–99)
Triglycerides: 60 mg/dL (ref 0–149)
VLDL Cholesterol Cal: 13 mg/dL (ref 5–40)

## 2020-07-16 LAB — PSA: Prostate Specific Ag, Serum: 0.6 ng/mL (ref 0.0–4.0)

## 2020-07-19 ENCOUNTER — Encounter: Payer: Self-pay | Admitting: Family Medicine

## 2020-08-18 IMAGING — DX PORTABLE CHEST - 1 VIEW
1 series · 1 of 1 positions shown · non-contrast
Comparison: None.

CLINICAL DATA: Worsening shortness of breath and cough.

EXAM:
PORTABLE CHEST 1 VIEW

[chest ap]
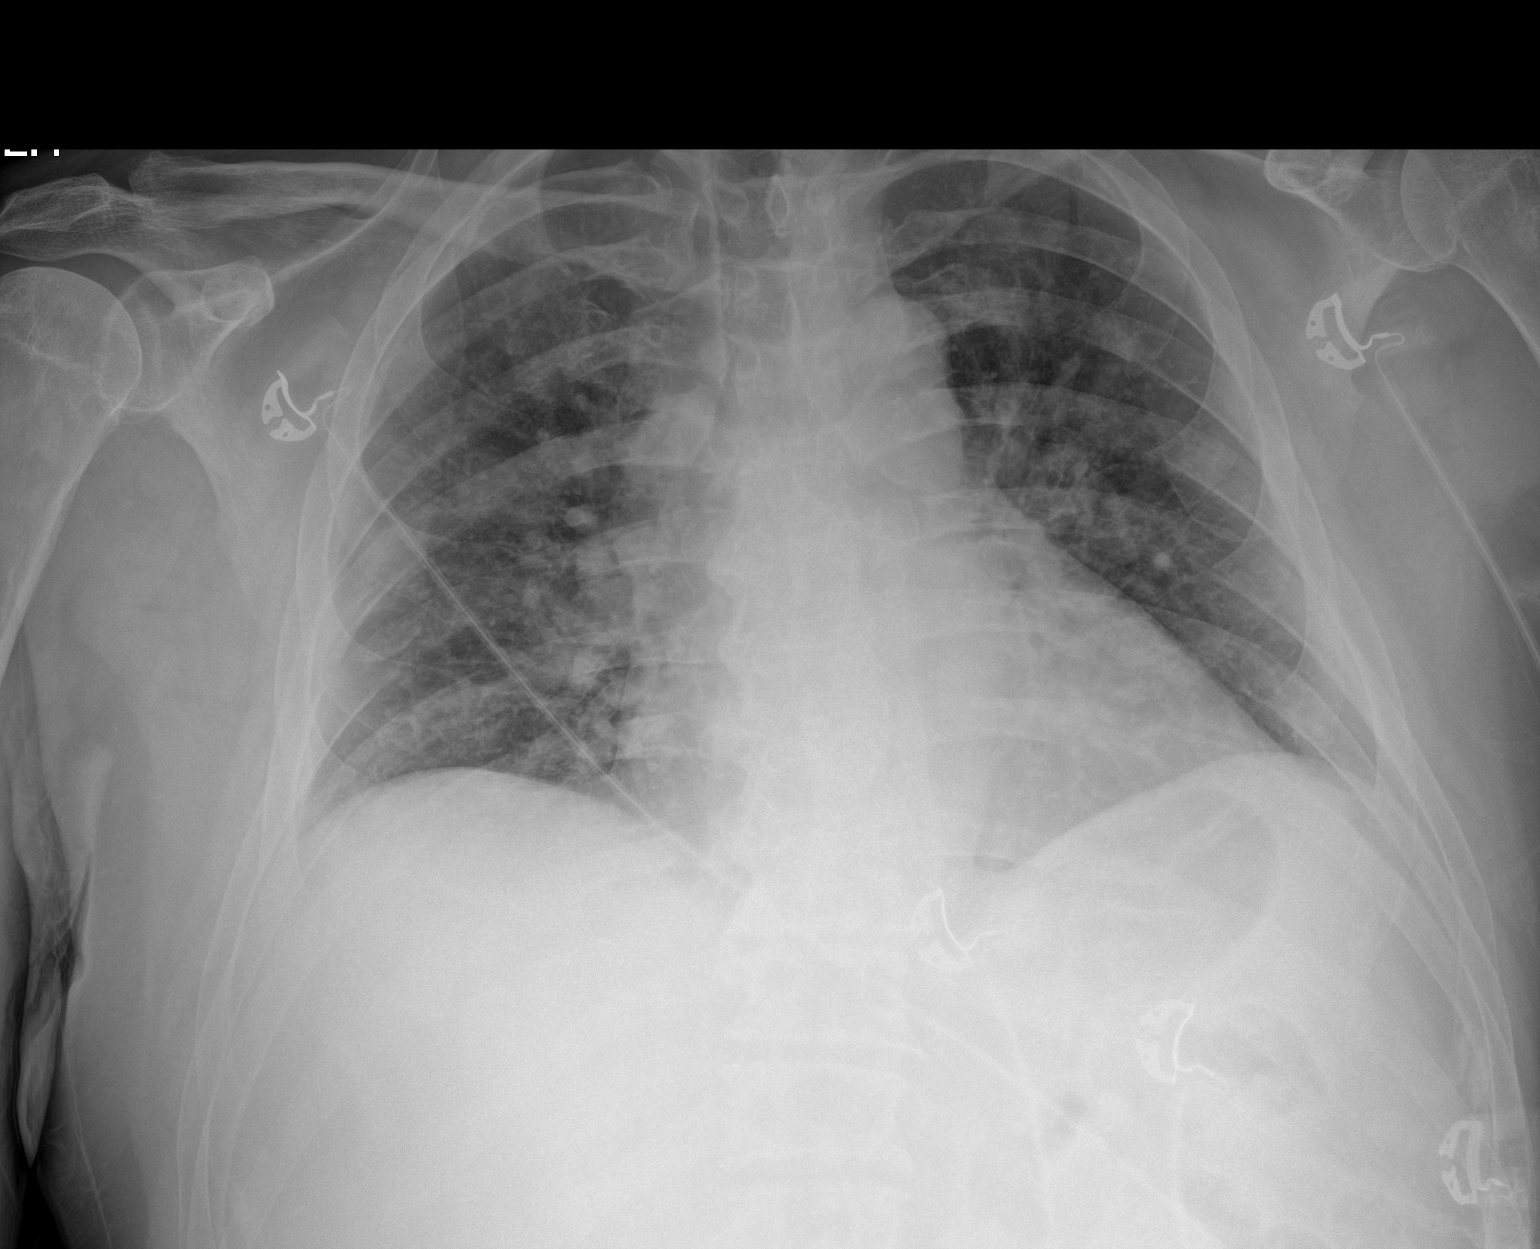

[1 of 1 positions shown; findings below may reference images not displayed]

FINDINGS: Heart size upper limits of normal. There appears to be pulmonary
vascular prominence without frank edema. Patient has taken a poor
inspiration. Question patchy bilateral pulmonary infiltrates without
lobar consolidation, collapse or effusion. No acute bone finding.
IMPRESSION: Poor inspiration. Heart size upper limits of normal. Question
pulmonary venous hypertension without frank edema. Patchy bilateral
pulmonary densities which could relate in part to the poor
inspiration or could represent patchy bilateral bronchopneumonia.
Consider two-view chest radiography when able.

## 2021-01-12 ENCOUNTER — Encounter: Payer: Self-pay | Admitting: Family Medicine

## 2021-01-12 ENCOUNTER — Other Ambulatory Visit: Payer: Self-pay

## 2021-01-12 ENCOUNTER — Ambulatory Visit: Payer: 59 | Admitting: Family Medicine

## 2021-01-12 VITALS — BP 132/72 | HR 55 | Temp 97.9°F | Ht 69.5 in | Wt 194.2 lb

## 2021-01-12 DIAGNOSIS — E7800 Pure hypercholesterolemia, unspecified: Secondary | ICD-10-CM

## 2021-01-12 DIAGNOSIS — I10 Essential (primary) hypertension: Secondary | ICD-10-CM

## 2021-01-12 DIAGNOSIS — E119 Type 2 diabetes mellitus without complications: Secondary | ICD-10-CM

## 2021-01-12 DIAGNOSIS — N138 Other obstructive and reflux uropathy: Secondary | ICD-10-CM

## 2021-01-12 DIAGNOSIS — N401 Enlarged prostate with lower urinary tract symptoms: Secondary | ICD-10-CM | POA: Diagnosis not present

## 2021-01-12 LAB — BAYER DCA HB A1C WAIVED: HB A1C (BAYER DCA - WAIVED): 7.1 % — ABNORMAL HIGH (ref <7.0)

## 2021-01-12 MED ORDER — AMLODIPINE BESYLATE 5 MG PO TABS: 5.0000 mg | ORAL_TABLET | Freq: Every day | ORAL | 1 refills | Status: DC

## 2021-01-12 MED ORDER — BENAZEPRIL HCL 40 MG PO TABS: 40.0000 mg | ORAL_TABLET | Freq: Every day | ORAL | 1 refills | Status: DC

## 2021-01-12 MED ORDER — TAMSULOSIN HCL 0.4 MG PO CAPS: 0.4000 mg | ORAL_CAPSULE | Freq: Every day | ORAL | 1 refills | Status: DC

## 2021-01-12 MED ORDER — METFORMIN HCL ER 750 MG PO TB24: 750.0000 mg | ORAL_TABLET | Freq: Every day | ORAL | 1 refills | Status: DC

## 2021-01-12 MED ORDER — ATORVASTATIN CALCIUM 40 MG PO TABS: 40.0000 mg | ORAL_TABLET | Freq: Every day | ORAL | 1 refills | Status: DC

## 2021-01-12 MED ORDER — FINASTERIDE 5 MG PO TABS: 5.0000 mg | ORAL_TABLET | Freq: Every day | ORAL | 1 refills | Status: DC

## 2021-01-12 NOTE — Assessment & Plan Note (Signed)
Under good control on current regimen. Continue current regimen. Continue to monitor. Call with any concerns. Refills given. Labs drawn today.   

## 2021-01-12 NOTE — Assessment & Plan Note (Signed)
Under good control on current regimen. Continue current regimen. Continue to monitor. Call with any concerns. Refills given.   

## 2021-01-12 NOTE — Assessment & Plan Note (Signed)
Under good control on current regimen with a1c of 7.1. Continue current regimen. Continue to monitor. Call with any concerns. Refills given. Labs drawn today.

## 2021-01-12 NOTE — Progress Notes (Signed)
BP 132/72   Pulse (!) 55   Temp 97.9 F (36.6 C)   Ht 5' 9.5" (1.765 m)   Wt 194 lb 3.2 oz (88.1 kg)   SpO2 100%   BMI 28.27 kg/m    Subjective:    Patient ID: Scott Hester, male    DOB: 1956-05-23, 65 y.o.   MRN: [id number]  HPI: Scott Hester is a 65 y.o. male  Chief Complaint  Patient presents with   Hypertension   Diabetes   Benign Prostatic Hypertrophy   Hyperlipidemia   HYPERTENSION / HYPERLIPIDEMIA Satisfied with current treatment? yes Duration of hypertension: chronic BP monitoring frequency: not checking BP medication side effects: no Past BP meds: amlodipine, benazepril Duration of hyperlipidemia: chronic Cholesterol medication side effects: no Cholesterol supplements: none Past cholesterol medications: atorvastatin Medication compliance: excellent compliance Aspirin: no Recent stressors: no Recurrent headaches: no Visual changes: no Palpitations: no Dyspnea: no Chest pain: no Lower extremity edema: no Dizzy/lightheaded: no  DIABETES Hypoglycemic episodes:no Polydipsia/polyuria: no Visual disturbance: no Chest pain: no Paresthesias: no Glucose Monitoring: no  Accucheck frequency: Not Checking  Fasting glucose:  Post prandial:  Evening:  Before meals: Taking Insulin?: no  Long acting insulin:  Short acting insulin: Blood Pressure Monitoring: not checking Retinal Examination: Up to Date Foot Exam: Up to Date Diabetic Education: Completed Pneumovax: Up to Date Influenza: Up to Date Aspirin: yes  BPH BPH status: controlled Satisfied with current treatment?: yes Medication side effects: no Medication compliance: excellent compliance Duration: chronic Nocturia: no Urinary frequency:no Incomplete voiding: no Urgency: no Weak urinary stream: no Straining to start stream: no Dysuria: no Onset: gradual Severity: mild   Relevant past medical, surgical, family and social history reviewed and updated as indicated. Interim medical  history since our last visit reviewed. Allergies and medications reviewed and updated.  Review of Systems  Constitutional: Negative.   Respiratory: Negative.    Cardiovascular: Negative.   Gastrointestinal: Negative.   Musculoskeletal: Negative.   Psychiatric/Behavioral: Negative.     Per HPI unless specifically indicated above     Objective:    BP 132/72   Pulse (!) 55   Temp 97.9 F (36.6 C)   Ht 5' 9.5" (1.765 m)   Wt 194 lb 3.2 oz (88.1 kg)   SpO2 100%   BMI 28.27 kg/m   Wt Readings from Last 3 Encounters:  01/12/21 194 lb 3.2 oz (88.1 kg)  07/15/20 192 lb 12.8 oz (87.5 kg)  06/24/20 190 lb 14.7 oz (86.6 kg)    Physical Exam Vitals and nursing note reviewed.  Constitutional:      General: He is not in acute distress.    Appearance: Normal appearance. He is not ill-appearing, toxic-appearing or diaphoretic.  HENT:     Head: Normocephalic and atraumatic.     Right Ear: External ear normal.     Left Ear: External ear normal.     Nose: Nose normal.     Mouth/Throat:     Mouth: Mucous membranes are moist.     Pharynx: Oropharynx is clear.  Eyes:     General: No scleral icterus.       Right eye: No discharge.        Left eye: No discharge.     Extraocular Movements: Extraocular movements intact.     Conjunctiva/sclera: Conjunctivae normal.     Pupils: Pupils are equal, round, and reactive to light.  Cardiovascular:     Rate and Rhythm: Normal rate and regular rhythm.  Pulses: Normal pulses.     Heart sounds: Normal heart sounds. No murmur heard.   No friction rub. No gallop.  Pulmonary:     Effort: Pulmonary effort is normal. No respiratory distress.     Breath sounds: Normal breath sounds. No stridor. No wheezing, rhonchi or rales.  Chest:     Chest wall: No tenderness.  Musculoskeletal:        General: Normal range of motion.     Cervical back: Normal range of motion and neck supple.  Skin:    General: Skin is warm and dry.     Capillary Refill:  Capillary refill takes less than 2 seconds.     Coloration: Skin is not jaundiced or pale.     Findings: No bruising, erythema, lesion or rash.  Neurological:     General: No focal deficit present.     Mental Status: He is alert and oriented to person, place, and time. Mental status is at baseline.  Psychiatric:        Mood and Affect: Mood normal.        Behavior: Behavior normal.        Thought Content: Thought content normal.        Judgment: Judgment normal.    Results for orders placed or performed in visit on 07/15/20  Bayer DCA Hb A1c Waived  Result Value Ref Range   HB A1C (BAYER DCA - WAIVED) 6.7 <7.0 %  Lipid Panel w/o Chol/HDL Ratio  Result Value Ref Range   Cholesterol, Total 125 100 - 199 mg/dL   Triglycerides 60 0 - 149 mg/dL   HDL 46 >80 mg/dL   VLDL Cholesterol Cal 13 5 - 40 mg/dL   LDL Chol Calc (NIH) 66 0 - 99 mg/dL  Comprehensive metabolic panel  Result Value Ref Range   Glucose 171 (H) 65 - 99 mg/dL   BUN 11 8 - 27 mg/dL   Creatinine, Ser 9.98 (L) 0.76 - 1.27 mg/dL   GFR calc non Af Amer 99 >59 mL/min/1.73   GFR calc Af Amer 115 >59 mL/min/1.73   BUN/Creatinine Ratio 15 10 - 24   Sodium 142 134 - 144 mmol/L   Potassium 4.4 3.5 - 5.2 mmol/L   Chloride 103 96 - 106 mmol/L   CO2 27 20 - 29 mmol/L   Calcium 9.3 8.6 - 10.2 mg/dL   Total Protein 6.9 6.0 - 8.5 g/dL   Albumin 4.4 3.8 - 4.8 g/dL   Globulin, Total 2.5 1.5 - 4.5 g/dL   Albumin/Globulin Ratio 1.8 1.2 - 2.2   Bilirubin Total 0.9 0.0 - 1.2 mg/dL   Alkaline Phosphatase 56 44 - 121 IU/L   AST 15 0 - 40 IU/L   ALT 21 0 - 44 IU/L  Microalbumin, Urine Waived  Result Value Ref Range   Microalb, Ur Waived 80 (H) 0 - 19 mg/L   Creatinine, Urine Waived 100 10 - 300 mg/dL   Microalb/Creat Ratio 30-300 (H) <30 mg/g  CBC with Differential/Platelet  Result Value Ref Range   WBC 4.9 3.4 - 10.8 x10E3/uL   RBC 4.64 4.14 - 5.80 x10E6/uL   Hemoglobin 14.1 13.0 - 17.7 g/dL   Hematocrit 33.8 25.0 - 51.0 %    MCV 89 79 - 97 fL   MCH 30.4 26.6 - 33.0 pg   MCHC 34.3 31.5 - 35.7 g/dL   RDW 53.9 76.7 - 34.1 %   Platelets 177 150 - 450 x10E3/uL   Neutrophils 53 Not  Estab. %   Lymphs 34 Not Estab. %   Monocytes 9 Not Estab. %   Eos 3 Not Estab. %   Basos 1 Not Estab. %   Neutrophils Absolute 2.6 1.4 - 7.0 x10E3/uL   Lymphocytes Absolute 1.6 0.7 - 3.1 x10E3/uL   Monocytes Absolute 0.4 0.1 - 0.9 x10E3/uL   EOS (ABSOLUTE) 0.2 0.0 - 0.4 x10E3/uL   Basophils Absolute 0.1 0.0 - 0.2 x10E3/uL   Immature Granulocytes 0 Not Estab. %   Immature Grans (Abs) 0.0 0.0 - 0.1 x10E3/uL  PSA  Result Value Ref Range   Prostate Specific Ag, Serum 0.6 0.0 - 4.0 ng/mL      Assessment & Plan:   Problem List Items Addressed This Visit       Cardiovascular and Mediastinum   Essential hypertension    Under good control on current regimen. Continue current regimen. Continue to monitor. Call with any concerns. Refills given. Labs drawn today.        Relevant Medications   benazepril (LOTENSIN) 40 MG tablet   atorvastatin (LIPITOR) 40 MG tablet   amLODipine (NORVASC) 5 MG tablet   Other Relevant Orders   Comprehensive metabolic panel     Endocrine   Controlled type 2 diabetes mellitus without complication, without long-term current use of insulin (HCC) - Primary    Under good control on current regimen with a1c of 7.1. Continue current regimen. Continue to monitor. Call with any concerns. Refills given. Labs drawn today.        Relevant Medications   benazepril (LOTENSIN) 40 MG tablet   atorvastatin (LIPITOR) 40 MG tablet   metFORMIN (GLUCOPHAGE-XR) 750 MG 24 hr tablet   Other Relevant Orders   Bayer DCA Hb A1c Waived   Comprehensive metabolic panel     Genitourinary   BPH with obstruction/lower urinary tract symptoms    Under good control on current regimen. Continue current regimen. Continue to monitor. Call with any concerns. Refills given.        Relevant Medications   finasteride (PROSCAR)  5 MG tablet   tamsulosin (FLOMAX) 0.4 MG CAPS capsule     Other   Hypercholesteremia    Under good control on current regimen. Continue current regimen. Continue to monitor. Call with any concerns. Refills given. Labs drawn today.       Relevant Medications   benazepril (LOTENSIN) 40 MG tablet   atorvastatin (LIPITOR) 40 MG tablet   amLODipine (NORVASC) 5 MG tablet   Other Relevant Orders   Comprehensive metabolic panel   Lipid Panel w/o Chol/HDL Ratio     Follow up plan: Return in about 6 months (around 07/15/2021) for physical.

## 2021-01-13 LAB — COMPREHENSIVE METABOLIC PANEL
ALT: 25 IU/L (ref 0–44)
AST: 17 IU/L (ref 0–40)
Albumin/Globulin Ratio: 2 (ref 1.2–2.2)
Albumin: 4.5 g/dL (ref 3.8–4.8)
Alkaline Phosphatase: 58 IU/L (ref 44–121)
BUN/Creatinine Ratio: 18 (ref 10–24)
BUN: 13 mg/dL (ref 8–27)
Bilirubin Total: 0.7 mg/dL (ref 0.0–1.2)
CO2: 26 mmol/L (ref 20–29)
Calcium: 8.9 mg/dL (ref 8.6–10.2)
Chloride: 104 mmol/L (ref 96–106)
Creatinine, Ser: 0.73 mg/dL — ABNORMAL LOW (ref 0.76–1.27)
Globulin, Total: 2.3 g/dL (ref 1.5–4.5)
Glucose: 152 mg/dL — ABNORMAL HIGH (ref 65–99)
Potassium: 4.5 mmol/L (ref 3.5–5.2)
Sodium: 140 mmol/L (ref 134–144)
Total Protein: 6.8 g/dL (ref 6.0–8.5)
eGFR: 102 mL/min/{1.73_m2} (ref >59)

## 2021-01-13 LAB — LIPID PANEL W/O CHOL/HDL RATIO
Cholesterol, Total: 119 mg/dL (ref 100–199)
HDL: 41 mg/dL (ref >39)
LDL Chol Calc (NIH): 63 mg/dL (ref 0–99)
Triglycerides: 73 mg/dL (ref 0–149)
VLDL Cholesterol Cal: 15 mg/dL (ref 5–40)

## 2021-01-14 ENCOUNTER — Encounter: Payer: Self-pay | Admitting: Family Medicine

## 2021-05-04 ENCOUNTER — Ambulatory Visit: Payer: 59

## 2021-05-25 ENCOUNTER — Other Ambulatory Visit: Payer: Self-pay

## 2021-05-25 ENCOUNTER — Ambulatory Visit (INDEPENDENT_AMBULATORY_CARE_PROVIDER_SITE_OTHER): Payer: 59

## 2021-05-25 DIAGNOSIS — Z23 Encounter for immunization: Secondary | ICD-10-CM

## 2021-06-20 ENCOUNTER — Encounter: Payer: 59 | Admitting: Family Medicine

## 2021-07-09 ENCOUNTER — Other Ambulatory Visit: Payer: Self-pay | Admitting: Family Medicine

## 2021-07-09 DIAGNOSIS — I10 Essential (primary) hypertension: Secondary | ICD-10-CM

## 2021-07-09 NOTE — Telephone Encounter (Signed)
Requested Prescriptions  Pending Prescriptions Disp Refills   benazepril (LOTENSIN) 40 MG tablet [Pharmacy Med Name: Benazepril HCl Oral Tablet 40 MG] 90 tablet 0    Sig: TAKE ONE TABLET BY MOUTH DAILY     Cardiovascular:  ACE Inhibitors Failed - 07/09/2021  9:36 AM      Failed - Cr in normal range and within 180 days    Creatinine, Ser  Date Value Ref Range Status  01/12/2021 0.73 (L) 0.76 - 1.27 mg/dL Final         Passed - K in normal range and within 180 days    Potassium  Date Value Ref Range Status  01/12/2021 4.5 3.5 - 5.2 mmol/L Final         Passed - Patient is not pregnant      Passed - Last BP in normal range    BP Readings from Last 1 Encounters:  01/12/21 132/72         Passed - Valid encounter within last 6 months    Recent Outpatient Visits          5 months ago Controlled type 2 diabetes mellitus without complication, without long-term current use of insulin (HCC)   [facility] Harvey, Megan P, DO   11 months ago Routine general medical examination at a health care facility   [facility] [place] P, DO   1 year ago Controlled type 2 diabetes mellitus without complication, without long-term current use of insulin (HCC)   [facility] [place], Megan P, DO   1 year ago Controlled type 2 diabetes mellitus without complication, without long-term current use of insulin (HCC)   [facility] [place], Megan P, DO   1 year ago Controlled type 2 diabetes mellitus without complication, without long-term current use of insulin (HCC)   [facility] [place], [place], DO      Future Appointments            In 2 weeks Laural Benes, Oralia Rud, DO [facility]

## 2021-07-16 ENCOUNTER — Other Ambulatory Visit: Payer: Self-pay | Admitting: Family Medicine

## 2021-07-16 DIAGNOSIS — N138 Other obstructive and reflux uropathy: Secondary | ICD-10-CM

## 2021-07-16 NOTE — Telephone Encounter (Signed)
Requested medication (s) are due for refill today: yes ° °Requested medication (s) are on the active medication list: yes ° °Last refill:  01/12/21 #90 1 RF ° °Future visit scheduled: yes ° °Notes to clinic:  overdue Hgb A1C ° ° °Requested Prescriptions  °Pending Prescriptions Disp Refills  ° metFORMIN (GLUCOPHAGE-XR) 750 MG 24 hr tablet [Pharmacy Med Name: metFORMIN HCl ER Oral Tablet Extended Release 24 Hour 750 MG] 90 tablet   °  Sig: TAKE ONE TABLET BY MOUTH DAILY with breakfast  °  ° Endocrinology:  Diabetes - Biguanides Failed - 07/16/2021  9:51 AM  °  °  Failed - Cr in normal range and within 360 days  °  Creatinine, Ser  °Date Value Ref Range Status  °01/12/2021 0.73 (L) 0.76 - 1.27 mg/dL Final  °  °  °  °  Failed - HBA1C is between 0 and 7.9 and within 180 days  °  HB A1C (BAYER DCA - WAIVED)  °Date Value Ref Range Status  °01/12/2021 7.1 (H) <7.0 % Final  °  Comment:  °                                        Diabetic Adult            <7.0 °                                      Healthy Adult        4.3 - 5.7 °                                                          (DCCT/NGSP) °American Diabetes Association's Summary of Glycemic Recommendations °for Adults with Diabetes: Hemoglobin A1c <7.0%. More stringent °glycemic goals (A1c <6.0%) may further reduce complications at the °cost of increased risk of hypoglycemia. °  °  °  °  °  Failed - Valid encounter within last 6 months  °  Recent Outpatient Visits   ° °      ° 6 months ago Controlled type 2 diabetes mellitus without complication, without long-term current use of insulin (HCC)  ° [facility] Johnson, Megan P, DO  ° 1 year ago Routine general medical examination at a health care facility  ° [facility] Johnson, Megan P, DO  ° 1 year ago Controlled type 2 diabetes mellitus without complication, without long-term current use of insulin (HCC)  ° [facility] Johnson, Megan P, DO  ° 1 year ago Controlled type 2 diabetes  mellitus without complication, without long-term current use of insulin (HCC)  ° [facility] Johnson, Megan P, DO  ° 1 year ago Controlled type 2 diabetes mellitus without complication, without long-term current use of insulin (HCC)  ° [facility] Johnson, Megan P, DO  ° °  °  °Future Appointments   ° °        ° In 1 week Johnson, Megan P, DO [facility]  ° °  ° °  °  °  Passed - eGFR in normal range and within 360   days  °  GFR calc Af Amer  °Date Value Ref Range Status  °07/15/2020 115 >59 mL/min/1.73 Final  °  Comment:  °  **In accordance with recommendations from the NKF-ASN Task force,** °  [facility] is in the process of updating its eGFR calculation to the °  2021 CKD-EPI creatinine equation that estimates kidney function °  without a race variable. °  ° °GFR, Estimated  °Date Value Ref Range Status  °06/24/2020 >60 >60 mL/min Final  °  Comment:  °  (NOTE) °Calculated using the CKD-EPI Creatinine Equation (2021) °  ° °GFR calc non Af Amer  °Date Value Ref Range Status  °07/15/2020 99 >59 mL/min/1.73 Final  ° °eGFR  °Date Value Ref Range Status  °01/12/2021 102 >59 mL/min/1.73 Final  °  °  °  °  °Signed Prescriptions Disp Refills  ° tamsulosin (FLOMAX) 0.4 MG CAPS capsule 90 capsule 0  °  Sig: TAKE ONE CAPSULE BY MOUTH DAILY  °  ° Urology: Alpha-Adrenergic Blocker Passed - 07/16/2021  9:51 AM  °  °  Passed - Last BP in normal range  °  BP Readings from Last 1 Encounters:  °01/12/21 132/72  °  °  °  °  Passed - Valid encounter within last 12 months  °  Recent Outpatient Visits   ° °      ° 6 months ago Controlled type 2 diabetes mellitus without complication, without long-term current use of insulin (HCC)  ° [facility] Johnson, Megan P, DO  ° 1 year ago Routine general medical examination at a health care facility  ° [facility] Johnson, Megan P, DO  ° 1 year ago Controlled type 2 diabetes mellitus without complication, without long-term current  use of insulin (HCC)  ° [facility] Johnson, Megan P, DO  ° 1 year ago Controlled type 2 diabetes mellitus without complication, without long-term current use of insulin (HCC)  ° [facility] Johnson, Megan P, DO  ° 1 year ago Controlled type 2 diabetes mellitus without complication, without long-term current use of insulin (HCC)  ° [facility] Johnson, Megan P, DO  ° °  °  °Future Appointments   ° °        ° In 1 week Johnson, Megan P, DO [facility]  ° °  ° °  °  °  ° finasteride (PROSCAR) 5 MG tablet 90 tablet 0  °  Sig: TAKE ONE TABLET BY MOUTH DAILY  °  ° Urology: 5-alpha Reductase Inhibitors Passed - 07/16/2021  9:51 AM  °  °  Passed - Valid encounter within last 12 months  °  Recent Outpatient Visits   ° °      ° 6 months ago Controlled type 2 diabetes mellitus without complication, without long-term current use of insulin (HCC)  ° [facility] Johnson, Megan P, DO  ° 1 year ago Routine general medical examination at a health care facility  ° [facility] Johnson, Megan P, DO  ° 1 year ago Controlled type 2 diabetes mellitus without complication, without long-term current use of insulin (HCC)  ° [facility] Johnson, Megan P, DO  ° 1 year ago Controlled type 2 diabetes mellitus without complication, without long-term current use of insulin (HCC)  ° [facility] Johnson, Megan P, DO  ° 1 year ago Controlled type 2 diabetes mellitus without complication, without long-term current use of insulin (HCC)  °   [place], [place], DO       Future Appointments             In 1 week Wynetta Emery, Barb Merino, DO [facility]

## 2021-07-16 NOTE — Telephone Encounter (Signed)
Requested Prescriptions  Pending Prescriptions Disp Refills   tamsulosin (FLOMAX) 0.4 MG CAPS capsule [Pharmacy Med Name: Tamsulosin HCl Oral Capsule 0.4 MG] 90 capsule 0    Sig: TAKE ONE CAPSULE BY MOUTH DAILY     Urology: Alpha-Adrenergic Blocker Passed - 07/16/2021  9:51 AM      Passed - Last BP in normal range    BP Readings from Last 1 Encounters:  01/12/21 132/72         Passed - Valid encounter within last 12 months    Recent Outpatient Visits          6 months ago Controlled type 2 diabetes mellitus without complication, without long-term current use of insulin ([place])   [place], [name], DO   1 year ago Routine general medical examination at a health care facility   [facility], [place] P, DO   1 year ago Controlled type 2 diabetes mellitus without complication, without long-term current use of insulin ([place])   [place], [name], DO   1 year ago Controlled type 2 diabetes mellitus without complication, without long-term current use of insulin ([place])   [place], [name], DO   1 year ago Controlled type 2 diabetes mellitus without complication, without long-term current use of insulin ([place])   [place], [place], DO      Future Appointments            In 1 week Johnson, [name], DO [place], [facility]            finasteride (PROSCAR) 5 MG tablet [employer] Med Name: Finasteride Oral Tablet 5 MG] 90 tablet 0    Sig: TAKE ONE TABLET BY MOUTH DAILY     Urology: 5-alpha Reductase Inhibitors Passed - 07/16/2021  9:51 AM      Passed - Valid encounter within last 12 months    Recent Outpatient Visits          6 months ago Controlled type 2 diabetes mellitus without complication, without long-term current use of insulin ([place])   [place], [name], DO   1 year ago Routine general medical examination at a health care facility   [facility], [place] P, DO   1 year ago Controlled type 2 diabetes mellitus without complication, without long-term current use of insulin ([place])   [place], [name], DO   1 year ago Controlled type 2 diabetes mellitus without complication, without long-term current use of insulin ([place])   [place], [name], DO   1 year ago Controlled type 2 diabetes mellitus without complication, without long-term current use of insulin ([place])   [place], [place], DO      Future Appointments            In 1 week Johnson, [name], DO [place], [facility]            metFORMIN (GLUCOPHAGE-XR) 750 MG 24 hr tablet [Pharmacy Med Name: metFORMIN HCl ER Oral Tablet Extended Release 24 Hour 750 MG] 90 tablet     Sig: TAKE ONE TABLET BY MOUTH DAILY with breakfast     Endocrinology:  Diabetes - Biguanides Failed - 07/16/2021  9:51 AM      Failed - Cr in normal range and within 360 days    Creatinine, Ser  Date Value Ref Range Status  01/12/2021 0.73 (  L) 0.76 - 1.27 mg/dL Final         Failed - HBA1C is between 0 and 7.9 and within 180 days    HB A1C (BAYER DCA - WAIVED)  Date Value Ref Range Status  01/12/2021 7.1 (H) <7.0 % Final    Comment:                                          Diabetic Adult            <7.0                                       Healthy Adult        4.3 - 5.7                                                           (DCCT/NGSP) American Diabetes Association's Summary of Glycemic Recommendations for Adults with Diabetes: Hemoglobin A1c <7.0%. More stringent glycemic goals (A1c <6.0%) may further reduce complications at the cost of increased risk of hypoglycemia.          Failed - Valid encounter within last 6 months    Recent Outpatient Visits          6 months ago Controlled type 2 diabetes mellitus without complication, without long-term current use of insulin ([place])   [place], [name], DO   1 year ago Routine general medical examination at a health care facility   [facility], [place] P, DO   1 year ago Controlled type 2 diabetes mellitus without complication, without long-term current use of insulin ([place])   [place], [name], DO   1 year ago Controlled type 2 diabetes mellitus without complication, without long-term current use of insulin ([place])   [place], [name], DO   1 year ago Controlled type 2 diabetes mellitus without complication, without long-term current use of insulin ([place])   [place], [place], DO      Future Appointments            In 1 week Johnson, [name], DO [place], [facility]           Passed - eGFR in normal range and within 360 days    GFR calc Af Amer  Date Value Ref Range Status  07/15/2020 115 >59 mL/min/1.73 Final    Comment:    **In accordance with recommendations from the NKF-ASN Task force,**   [facility] is in the process of updating its eGFR calculation to the   2021 CKD-EPI creatinine equation that estimates kidney function   without a race variable.    GFR, Estimated  Date Value Ref Range Status  06/24/2020 >60 >60 mL/min Final    Comment:    (NOTE) Calculated using the CKD-EPI Creatinine Equation (2021)    GFR calc non Af Amer  Date Value Ref Range Status  07/15/2020 99 >59 mL/min/1.73 Final   eGFR  Date Value Ref Range Status  01/12/2021 102 >59 mL/min/1.73 Final

## 2021-07-18 ENCOUNTER — Encounter: Payer: 59 | Admitting: Family Medicine

## 2021-07-19 NOTE — Telephone Encounter (Signed)
Requested medication (s) are due for refill today: yes  Requested medication (s) are on the active medication list: yes  Last refill:  01/12/21 #90/1RF  Future visit scheduled: no  Notes to clinic:  Unable to refill per protocol due to failed labs, no updated results. Pt is also due for appt     Requested Prescriptions  Pending Prescriptions Disp Refills   metFORMIN (GLUCOPHAGE-XR) 750 MG 24 hr tablet 90 tablet 1    Sig: Take 1 tablet (750 mg total) by mouth daily with breakfast.     Endocrinology:  Diabetes - Biguanides Failed - 07/19/2021  6:46 PM      Failed - Cr in normal range and within 360 days    Creatinine, Ser  Date Value Ref Range Status  01/12/2021 0.73 (L) 0.76 - 1.27 mg/dL Final          Failed - HBA1C is between 0 and 7.9 and within 180 days    HB A1C (BAYER DCA - WAIVED)  Date Value Ref Range Status  01/12/2021 7.1 (H) <7.0 % Final    Comment:                                          Diabetic Adult            <7.0                                       Healthy Adult        4.3 - 5.7                                                           (DCCT/NGSP) American Diabetes Association's Summary of Glycemic Recommendations for Adults with Diabetes: Hemoglobin A1c <7.0%. More stringent glycemic goals (A1c <6.0%) may further reduce complications at the cost of increased risk of hypoglycemia.           Failed - Valid encounter within last 6 months    Recent Outpatient Visits           6 months ago Controlled type 2 diabetes mellitus without complication, without long-term current use of insulin ([place])   [place], [name], DO   1 year ago Routine general medical examination at a health care facility   [facility] [place], [place] P, DO   1 year ago Controlled type 2 diabetes mellitus without complication, without long-term current use of insulin [facility])   [place], [name], DO   1 year ago Controlled type 2  diabetes mellitus without complication, without long-term current use of insulin ([place])   [place] H[place], [name], DO   1 year ago Controlled type 2 diabetes mellitus without complication, without long-term current use of insulin ([place])   [name], [name], DO              Passed - eGFR in normal range and within 360 days    GFR calc Af Amer  Date Value Ref Range Status  07/15/2020 115 >59 mL/min/1.73 Final    Comment:    **  In accordance with recommendations from the NKF-ASN Task force,**   [facility] is in the process of updating its eGFR calculation to the   2021 CKD-EPI creatinine equation that estimates kidney function   without a race variable.    GFR, Estimated  Date Value Ref Range Status  06/24/2020 >60 >60 mL/min Final    Comment:    (NOTE) Calculated using the CKD-EPI Creatinine Equation (2021)    GFR calc non Af Amer  Date Value Ref Range Status  07/15/2020 99 >59 mL/min/1.73 Final   eGFR  Date Value Ref Range Status  01/12/2021 102 >59 mL/min/1.73 Final          Signed Prescriptions Disp Refills   tamsulosin (FLOMAX) 0.4 MG CAPS capsule 90 capsule 0    Sig: TAKE ONE CAPSULE BY MOUTH DAILY     Urology: Alpha-Adrenergic Blocker Passed - 07/16/2021  9:51 AM      Passed - Last BP in normal range    BP Readings from Last 1 Encounters:  01/12/21 132/72          Passed - Valid encounter within last 12 months    Recent Outpatient Visits           6 months ago Controlled type 2 diabetes mellitus without complication, without long-term current use of insulin ([place])   [place], [name], DO   1 year ago Routine general medical examination at a health care facility   [facility], [place] P, DO   1 year ago Controlled type 2 diabetes mellitus without complication, without long-term current use of insulin ([place])   [place] H[place], [name], DO   1 year ago Controlled  type 2 diabetes mellitus without complication, without long-term current use of insulin ([place] H[place])   [name], [name], DO   1 year ago Controlled type 2 diabetes mellitus without complication, without long-term current use of insulin ([place] Hester)   [name], [name], DO               finasteride (PROSCAR) 5 MG tablet 90 tablet 0    Sig: TAKE ONE TABLET BY MOUTH DAILY     Urology: 5-alpha Reductase Inhibitors Passed - 07/16/2021  9:51 AM      Passed - Valid encounter within last 12 months    Recent Outpatient Visits           6 months ago Controlled type 2 diabetes mellitus without complication, without long-term current use of insulin ([place] H[place])   [name], [name], DO   1 year ago Routine general medical examination at a health care facility   [name], [place] P, DO   1 year ago Controlled type 2 diabetes mellitus without complication, without long-term current use of insulin ([place] Hester)   [place], [name], DO   1 year ago Controlled type 2 diabetes mellitus without complication, without long-term current use of insulin ([place] Hester)   [place] H[place], [name], DO   1 year ago Controlled type 2 diabetes mellitus without complication, without long-term current use of insulin ([place] Hester)   [facility] [place], [name], DO              Refused Prescriptions Disp Refills   metFORMIN (GLUCOPHAGE-XR) 750 MG 24 hr tablet [Pharmacy Med Name: metFORMIN HCl ER Oral Tablet Extended Release 24 Hour 750 MG] 90 tablet     Sig: TAKE  ONE TABLET BY MOUTH DAILY with breakfast     Endocrinology:  Diabetes - Biguanides Failed - 07/19/2021  6:46 PM      Failed - Cr in normal range and within 360 days    Creatinine, Ser  Date Value Ref Range Status  01/12/2021 0.73 (L) 0.76 - 1.27 mg/dL Final          Failed - HBA1C is between 0 and 7.9 and within 180 days    HB A1C (BAYER DCA  - WAIVED)  Date Value Ref Range Status  01/12/2021 7.1 (H) <7.0 % Final    Comment:                                          Diabetic Adult            <7.0                                       Healthy Adult        4.3 - 5.7                                                           (DCCT/NGSP) American Diabetes Association's Summary of Glycemic Recommendations for Adults with Diabetes: Hemoglobin A1c <7.0%. More stringent glycemic goals (A1c <6.0%) may further reduce complications at the cost of increased risk of hypoglycemia.           Failed - Valid encounter within last 6 months    Recent Outpatient Visits           6 months ago Controlled type 2 diabetes mellitus without complication, without long-term current use of insulin ([place])   [place], [name], DO   1 year ago Routine general medical examination at a health care facility   [facility], [place] P, DO   1 year ago Controlled type 2 diabetes mellitus without complication, without long-term current use of insulin ([place])   [place], [name], DO   1 year ago Controlled type 2 diabetes mellitus without complication, without long-term current use of insulin ([place])   [place], [name], DO   1 year ago Controlled type 2 diabetes mellitus without complication, without long-term current use of insulin ([place])   [facility], [name], DO              Passed - eGFR in normal range and within 360 days    GFR calc Af Amer  Date Value Ref Range Status  07/15/2020 115 >59 mL/min/1.73 Final    Comment:    **In accordance with recommendations from the NKF-ASN Task force,**   [facility] is in the process of updating its eGFR calculation to the   2021 CKD-EPI creatinine equation that estimates kidney function   without a race variable.    GFR, Estimated  Date Value Ref Range Status  06/24/2020 >60 >60 mL/min Final    Comment:     (NOTE) Calculated using the CKD-EPI Creatinine Equation (2021)    GFR calc non Af [place]  Date Value  Ref Range Status  07/15/2020 99 >59 mL/min/1.73 Final   eGFR  Date Value Ref Range Status  01/12/2021 102 >59 mL/min/1.73 Final

## 2021-07-19 NOTE — Telephone Encounter (Signed)
Pt called to report that he will be completely out of his Metformin by the weekend. He is requesting a refill of this at the same pharmacy

## 2021-07-19 NOTE — Addendum Note (Signed)
Addended by: Ross Ludwig on: 07/19/2021 06:46 PM   Modules accepted: Orders

## 2021-07-20 ENCOUNTER — Other Ambulatory Visit: Payer: Self-pay | Admitting: Family Medicine

## 2021-07-20 NOTE — Telephone Encounter (Signed)
Needs appt

## 2021-07-20 NOTE — Telephone Encounter (Signed)
Unable to leave vm to schedule appt

## 2021-07-21 MED ORDER — METFORMIN HCL ER 750 MG PO TB24: 750.0000 mg | ORAL_TABLET | Freq: Every day | ORAL | 0 refills | Status: DC

## 2021-07-21 NOTE — Telephone Encounter (Signed)
2nd attempt, Unable to leave vm to schedule an appt.

## 2021-07-21 NOTE — Telephone Encounter (Signed)
Called pt to reschedule his physical. Unable to leave vm

## 2021-07-21 NOTE — Telephone Encounter (Signed)
Due for CPE. Please call to schedule.

## 2021-07-21 NOTE — Telephone Encounter (Signed)
Requested medication (s) are due for refill today:   Yes ° °Requested medication (s) are on the active medication list:   Yes ° °Future visit scheduled:   No  I attempted to call  him to make an appt however there was no answer and no voicemail came on. ° ° °Last ordered: 01/12/2021 #90, 1 refill ° °Returned because he needs his 6 month visit.  Provider to review for further refills.  ° °Requested Prescriptions  °Pending Prescriptions Disp Refills  ° metFORMIN (GLUCOPHAGE-XR) 750 MG 24 hr tablet [Pharmacy Med Name: metFORMIN HCl ER Oral Tablet Extended Release 24 Hour 750 MG] 90 tablet 0  °  Sig: TAKE ONE TABLET BY MOUTH DAILY with breakfast  °  ° Endocrinology:  Diabetes - Biguanides Failed - 07/20/2021  5:58 PM  °  °  Failed - Cr in normal range and within 360 days  °  Creatinine, Ser  °Date Value Ref Range Status  °01/12/2021 0.73 (L) 0.76 - 1.27 mg/dL Final  °  °  °  °  Failed - HBA1C is between 0 and 7.9 and within 180 days  °  HB A1C (BAYER DCA - WAIVED)  °Date Value Ref Range Status  °01/12/2021 7.1 (H) <7.0 % Final  °  Comment:  °                                        Diabetic Adult            <7.0 °                                      Healthy Adult        4.3 - 5.7 °                                                          (DCCT/NGSP) °American Diabetes Association's Summary of Glycemic Recommendations °for Adults with Diabetes: Hemoglobin A1c <7.0%. More stringent °glycemic goals (A1c <6.0%) may further reduce complications at the °cost of increased risk of hypoglycemia. °  °  °  °  °  Failed - Valid encounter within last 6 months  °  Recent Outpatient Visits   ° °      ° 6 months ago Controlled type 2 diabetes mellitus without complication, without long-term current use of insulin (HCC)  ° [facility] Johnson, Megan P, DO  ° 1 year ago Routine general medical examination at a health care facility  ° [facility] Johnson, Megan P, DO  ° 1 year ago Controlled type 2 diabetes mellitus  without complication, without long-term current use of insulin (HCC)  ° [facility] Johnson, Megan P, DO  ° 1 year ago Controlled type 2 diabetes mellitus without complication, without long-term current use of insulin (HCC)  ° [facility] Johnson, Megan P, DO  ° 1 year ago Controlled type 2 diabetes mellitus without complication, without long-term current use of insulin (HCC)  ° [facility] Johnson, Megan P, DO  ° °  °  ° °  °  °  Passed - eGFR   in normal range and within 360 days    GFR calc Af Amer  Date Value Ref Range Status  07/15/2020 115 >59 mL/min/1.73 Final    Comment:    **In accordance with recommendations from the NKF-ASN Task force,**   [facility] is in the process of updating its eGFR calculation to the   2021 CKD-EPI creatinine equation that estimates kidney function   without a race variable.    GFR, Estimated  Date Value Ref Range Status  06/24/2020 >60 >60 mL/min Final    Comment:    (NOTE) Calculated using the CKD-EPI Creatinine Equation (2021)    GFR calc non Af Amer  Date Value Ref Range Status  07/15/2020 99 >59 mL/min/1.73 Final   eGFR  Date Value Ref Range Status  01/12/2021 102 >59 mL/min/1.73 Final

## 2021-07-22 NOTE — Telephone Encounter (Signed)
2nd attempt- Unable to leave message to ask pt to call back to reschedule physical.

## 2021-07-25 NOTE — Telephone Encounter (Signed)
3rd attempt- LM with wife to schedule physical

## 2021-07-26 ENCOUNTER — Encounter: Payer: Self-pay | Admitting: Family Medicine

## 2021-08-11 ENCOUNTER — Encounter: Payer: Self-pay | Admitting: Internal Medicine

## 2021-08-11 ENCOUNTER — Ambulatory Visit (INDEPENDENT_AMBULATORY_CARE_PROVIDER_SITE_OTHER): Payer: Self-pay | Admitting: Internal Medicine

## 2021-08-11 DIAGNOSIS — J329 Chronic sinusitis, unspecified: Secondary | ICD-10-CM | POA: Insufficient documentation

## 2021-08-11 MED ORDER — FLUTICASONE PROPIONATE 50 MCG/ACT NA SUSP
2.0000 | Freq: Every day | NASAL | 6 refills | Status: DC
Start: 2021-08-11 — End: 2021-08-23

## 2021-08-11 MED ORDER — AMOXICILLIN-POT CLAVULANATE 875-125 MG PO TABS: 1.0000 | ORAL_TABLET | Freq: Two times a day (BID) | ORAL | 0 refills | Status: AC

## 2021-08-11 MED ORDER — BENZONATATE 100 MG PO CAPS: 100.0000 mg | ORAL_CAPSULE | Freq: Three times a day (TID) | ORAL | 0 refills | Status: DC | PRN

## 2021-08-11 MED ORDER — FEXOFENADINE HCL 180 MG PO TABS: 180.0000 mg | ORAL_TABLET | Freq: Every day | ORAL | 1 refills | Status: DC

## 2021-08-11 NOTE — Progress Notes (Signed)
There were no vitals taken for this visit.   Subjective:    Patient ID: Scott Hester, male    DOB: Jun 09, 1956, 66 y.o.   MRN: [id number]  No chief complaint on file.   HPI: Scott Hester is a 66 y.o. male   This visit was completed via telephone due to the restrictions of the COVID-19 pandemic. All issues as above were discussed and addressed but no physical exam was performed. If it was felt that the patient should be evaluated in the office, they were directed there. The patient verbally consented to this visit. Patient was unable to complete an audio/visual visit due to Technical difficulties. Due to the catastrophic nature of the COVID-19 pandemic, this visit was done through audio contact only. Location of the patient: home Location of the provider: work Those involved with this call:  Provider: Charlynne Cousins, MD CMA: Frazier Butt, [place] Desk/Registration: Myrlene Broker  Time spent on call: 10 minutes on the phone discussing health concerns. 10 minutes total spent in review of patient's record and preparation of their chart.    Sinus Problem This is a new problem. The current episode started in the past 7 days. There has been no fever. The pain is mild. Associated symptoms include congestion, coughing, headaches, sinus pressure and a sore throat. Pertinent negatives include no chills, diaphoresis, ear pain, hoarse voice, neck pain, shortness of breath, sneezing or swollen glands. (Nose and around the eyes. No headache but pressure over his eyes and sinuses. Has some pressure. )  URI  This is a new (has a yellow coloured discharge) problem. The current episode started in the past 7 days. There has been no fever. Associated symptoms include congestion, coughing, headaches and a sore throat. Pertinent negatives include no abdominal pain, chest pain, diarrhea, dysuria, ear pain, joint pain, joint swelling, nausea, neck pain, plugged ear sensation, rash, rhinorrhea, sinus pain,  sneezing, swollen glands, vomiting or wheezing.   No chief complaint on file.   Relevant past medical, surgical, family and social history reviewed and updated as indicated. Interim medical history since our last visit reviewed. Allergies and medications reviewed and updated.  Review of Systems  Constitutional:  Negative for chills and diaphoresis.  HENT:  Positive for congestion, sinus pressure and sore throat. Negative for ear pain, hoarse voice, rhinorrhea, sinus pain and sneezing.   Respiratory:  Positive for cough. Negative for shortness of breath and wheezing.   Cardiovascular:  Negative for chest pain.  Gastrointestinal:  Negative for abdominal pain, diarrhea, nausea and vomiting.  Genitourinary:  Negative for dysuria.  Musculoskeletal:  Negative for joint pain and neck pain.  Skin:  Negative for rash.  Neurological:  Positive for headaches.   Per HPI unless specifically indicated above     Objective:    There were no vitals taken for this visit.  Wt Readings from Last 3 Encounters:  01/12/21 194 lb 3.2 oz (88.1 kg)  07/15/20 192 lb 12.8 oz (87.5 kg)  06/24/20 190 lb 14.7 oz (86.6 kg)    Physical Exam  Results for orders placed or performed in visit on 01/12/21  Bayer DCA Hb A1c Waived  Result Value Ref Range   HB A1C (BAYER DCA - WAIVED) 7.1 (H) <7.0 %  Comprehensive metabolic panel  Result Value Ref Range   Glucose 152 (H) 65 - 99 mg/dL   BUN 13 8 - 27 mg/dL   Creatinine, Ser 0.73 (L) 0.76 - 1.27 mg/dL   eGFR 102 >59  mL/min/1.73   BUN/Creatinine Ratio 18 10 - 24   Sodium 140 134 - 144 mmol/L   Potassium 4.5 3.5 - 5.2 mmol/L   Chloride 104 96 - 106 mmol/L   CO2 26 20 - 29 mmol/L   Calcium 8.9 8.6 - 10.2 mg/dL   Total Protein 6.8 6.0 - 8.5 g/dL   Albumin 4.5 3.8 - 4.8 g/dL   Globulin, Total 2.3 1.5 - 4.5 g/dL   Albumin/Globulin Ratio 2.0 1.2 - 2.2   Bilirubin Total 0.7 0.0 - 1.2 mg/dL   Alkaline Phosphatase 58 44 - 121 IU/L   AST 17 0 - 40 IU/L   ALT 25 0  - 44 IU/L  Lipid Panel w/o Chol/HDL Ratio  Result Value Ref Range   Cholesterol, Total 119 100 - 199 mg/dL   Triglycerides 73 0 - 149 mg/dL   HDL 41 >39 mg/dL   VLDL Cholesterol Cal 15 5 - 40 mg/dL   LDL Chol Calc (NIH) 63 0 - 99 mg/dL        Current Outpatient Medications:    amoxicillin-clavulanate (AUGMENTIN) 875-125 MG tablet, Take 1 tablet by mouth 2 (two) times daily for 7 days., Disp: 14 tablet, Rfl: 0   benzonatate (TESSALON) 100 MG capsule, Take 1 capsule (100 mg total) by mouth 3 (three) times daily as needed for cough., Disp: 20 capsule, Rfl: 0   fexofenadine (ALLEGRA ALLERGY) 180 MG tablet, Take 1 tablet (180 mg total) by mouth daily., Disp: 10 tablet, Rfl: 1   fluticasone (FLONASE) 50 MCG/ACT nasal spray, Place 2 sprays into both nostrils daily., Disp: 16 g, Rfl: 6   amLODipine (NORVASC) 5 MG tablet, Take 1 tablet (5 mg total) by mouth daily., Disp: 90 tablet, Rfl: 1   atorvastatin (LIPITOR) 40 MG tablet, Take 1 tablet (40 mg total) by mouth daily., Disp: 90 tablet, Rfl: 1   benazepril (LOTENSIN) 40 MG tablet, TAKE ONE TABLET BY MOUTH DAILY, Disp: 90 tablet, Rfl: 0   finasteride (PROSCAR) 5 MG tablet, TAKE ONE TABLET BY MOUTH DAILY, Disp: 90 tablet, Rfl: 0   metFORMIN (GLUCOPHAGE-XR) 750 MG 24 hr tablet, Take 1 tablet (750 mg total) by mouth daily with breakfast. Needs appt for more refills, Disp: 30 tablet, Rfl: 0   tamsulosin (FLOMAX) 0.4 MG CAPS capsule, TAKE ONE CAPSULE BY MOUTH DAILY, Disp: 90 capsule, Rfl: 0    Assessment & Plan:  Acute sinusitis Will start pt on augmentin bid x 7 days. pt advised to take Tylenol q 4- 6 hourly as needed. pt to take allegra q pm as needed and to call office if symptoms worsened pt verbalised understanding of such.      Problem List Items Addressed This Visit       Respiratory   Sinusitis - Primary   Relevant Medications   amoxicillin-clavulanate (AUGMENTIN) 875-125 MG tablet   fexofenadine (ALLEGRA ALLERGY) 180 MG tablet    fluticasone (FLONASE) 50 MCG/ACT nasal spray   benzonatate (TESSALON) 100 MG capsule     No orders of the defined types were placed in this encounter.    Meds ordered this encounter  Medications   amoxicillin-clavulanate (AUGMENTIN) 875-125 MG tablet    Sig: Take 1 tablet by mouth 2 (two) times daily for 7 days.    Dispense:  14 tablet    Refill:  0   fexofenadine (ALLEGRA ALLERGY) 180 MG tablet    Sig: Take 1 tablet (180 mg total) by mouth daily.    Dispense:  10 tablet  Refill:  1   fluticasone (FLONASE) 50 MCG/ACT nasal spray    Sig: Place 2 sprays into both nostrils daily.    Dispense:  16 g    Refill:  6   benzonatate (TESSALON) 100 MG capsule    Sig: Take 1 capsule (100 mg total) by mouth 3 (three) times daily as needed for cough.    Dispense:  20 capsule    Refill:  0     Follow up plan: No follow-ups on file.

## 2021-08-13 ENCOUNTER — Other Ambulatory Visit: Payer: Self-pay | Admitting: Family Medicine

## 2021-08-15 NOTE — Telephone Encounter (Signed)
Appointment 08/23/21- patient will be short with last RF- #15 given as extension  Requested Prescriptions  Pending Prescriptions Disp Refills   metFORMIN (GLUCOPHAGE-XR) 750 MG 24 hr tablet [Pharmacy Med Name: metFORMIN HCl ER Oral Tablet Extended Release 24 Hour 750 MG] 15 tablet 0    Sig: TAKE ONE TABLET BY MOUTH DAILY with breakfast. needs appt for more refills     Endocrinology:  Diabetes - Biguanides Failed - 08/13/2021 10:53 AM      Failed - Cr in normal range and within 360 days    Creatinine, Ser  Date Value Ref Range Status  01/12/2021 0.73 (L) 0.76 - 1.27 mg/dL Final         Failed - HBA1C is between 0 and 7.9 and within 180 days    HB A1C (BAYER DCA - WAIVED)  Date Value Ref Range Status  01/12/2021 7.1 (H) <7.0 % Final    Comment:                                          Diabetic Adult            <7.0                                       Healthy Adult        4.3 - 5.7                                                           (DCCT/NGSP) American Diabetes Association's Summary of Glycemic Recommendations for Adults with Diabetes: Hemoglobin A1c <7.0%. More stringent glycemic goals (A1c <6.0%) may further reduce complications at the cost of increased risk of hypoglycemia.          Failed - B12 Level in normal range and within 720 days    No results found for: VITAMINB12       Failed - Valid encounter within last 6 months    Recent Outpatient Visits          4 days ago Sinusitis, unspecified chronicity, unspecified location   [facility] Vigg, [place], MD   7 months ago Controlled type 2 diabetes mellitus without complication, without long-term current use of insulin ([place])   [place], [place], DO   1 year ago Routine general medical examination at a health care facility   [facility], [place] P, DO   1 year ago Controlled type 2 diabetes mellitus without complication, without long-term current use of insulin ([place])    [place], Megan P, DO   1 year ago Controlled type 2 diabetes mellitus without complication, without long-term current use of insulin ([place])   [place], [place], DO      Future Appointments            In 1 week Johnson, Megan P, DO [place]           Failed - CBC within normal limits and completed in the last 12 months    WBC  Date Value Ref Range Status  07/15/2020  4.9 3.4 - 10.8 x10E3/uL Final  11/17/2018 7.7 4.0 - 10.5 K/uL Final   RBC  Date Value Ref Range Status  07/15/2020 4.64 4.14 - 5.80 x10E6/uL Final  11/17/2018 4.03 (L) 4.22 - 5.81 MIL/uL Final   Hemoglobin  Date Value Ref Range Status  07/15/2020 14.1 13.0 - 17.7 g/dL Final   Hematocrit  Date Value Ref Range Status  07/15/2020 41.1 37.5 - 51.0 % Final   MCHC  Date Value Ref Range Status  07/15/2020 34.3 31.5 - 35.7 g/dL Final  11/17/2018 34.1 30.0 - 36.0 g/dL Final   [facility]  Date Value Ref Range Status  07/15/2020 30.4 26.6 - 33.0 pg Final  11/17/2018 29.5 26.0 - 34.0 pg Final   MCV  Date Value Ref Range Status  07/15/2020 89 79 - 97 fL Final   No results found for: PLTCOUNTKUC, LABPLAT, POCPLA RDW  Date Value Ref Range Status  07/15/2020 12.1 11.6 - 15.4 % Final         Passed - eGFR in normal range and within 360 days    GFR calc Af Amer  Date Value Ref Range Status  07/15/2020 115 >59 mL/min/1.73 Final    Comment:    **In accordance with recommendations from the NKF-ASN Task force,**   [facility] is in the process of updating its eGFR calculation to the   2021 CKD-EPI creatinine equation that estimates kidney function   without a race variable.    GFR, Estimated  Date Value Ref Range Status  06/24/2020 >60 >60 mL/min Final    Comment:    (NOTE) Calculated using the CKD-EPI Creatinine Equation (2021)    GFR calc non Af Amer  Date Value Ref Range Status  07/15/2020 99 >59 mL/min/1.73 Final   eGFR  Date Value Ref  Range Status  01/12/2021 102 >59 mL/min/1.73 Final

## 2021-08-23 ENCOUNTER — Other Ambulatory Visit: Payer: Self-pay

## 2021-08-23 ENCOUNTER — Ambulatory Visit (INDEPENDENT_AMBULATORY_CARE_PROVIDER_SITE_OTHER): Payer: PRIVATE HEALTH INSURANCE | Admitting: Family Medicine

## 2021-08-23 ENCOUNTER — Encounter: Payer: Self-pay | Admitting: Family Medicine

## 2021-08-23 VITALS — BP 121/72 | HR 67 | Temp 98.2°F | Ht 69.5 in | Wt 194.8 lb

## 2021-08-23 DIAGNOSIS — N138 Other obstructive and reflux uropathy: Secondary | ICD-10-CM

## 2021-08-23 DIAGNOSIS — E119 Type 2 diabetes mellitus without complications: Secondary | ICD-10-CM

## 2021-08-23 DIAGNOSIS — Z0000 Encounter for general adult medical examination without abnormal findings: Secondary | ICD-10-CM | POA: Diagnosis not present

## 2021-08-23 DIAGNOSIS — Z1211 Encounter for screening for malignant neoplasm of colon: Secondary | ICD-10-CM

## 2021-08-23 DIAGNOSIS — N401 Enlarged prostate with lower urinary tract symptoms: Secondary | ICD-10-CM

## 2021-08-23 DIAGNOSIS — I10 Essential (primary) hypertension: Secondary | ICD-10-CM | POA: Diagnosis not present

## 2021-08-23 DIAGNOSIS — Z23 Encounter for immunization: Secondary | ICD-10-CM

## 2021-08-23 DIAGNOSIS — E7800 Pure hypercholesterolemia, unspecified: Secondary | ICD-10-CM

## 2021-08-23 LAB — URINALYSIS, ROUTINE W REFLEX MICROSCOPIC
Bilirubin, UA: NEGATIVE
Glucose, UA: NEGATIVE
Ketones, UA: NEGATIVE
Leukocytes,UA: NEGATIVE
Nitrite, UA: NEGATIVE
Protein,UA: NEGATIVE
RBC, UA: NEGATIVE
Specific Gravity, UA: 1.02 (ref 1.005–1.030)
Urobilinogen, Ur: 0.2 mg/dL (ref 0.2–1.0)
pH, UA: 6 (ref 5.0–7.5)

## 2021-08-23 LAB — MICROALBUMIN, URINE WAIVED
Creatinine, Urine Waived: 50 mg/dL (ref 10–300)
Microalb, Ur Waived: 30 mg/L — ABNORMAL HIGH (ref 0–19)

## 2021-08-23 LAB — BAYER DCA HB A1C WAIVED: HB A1C (BAYER DCA - WAIVED): 7.6 % — ABNORMAL HIGH (ref 4.8–5.6)

## 2021-08-23 MED ORDER — BENAZEPRIL HCL 40 MG PO TABS: 40.0000 mg | ORAL_TABLET | Freq: Every day | ORAL | 1 refills | Status: DC

## 2021-08-23 MED ORDER — METFORMIN HCL ER 750 MG PO TB24: 750.0000 mg | ORAL_TABLET | Freq: Two times a day (BID) | ORAL | 1 refills | Status: DC

## 2021-08-23 MED ORDER — FINASTERIDE 5 MG PO TABS
5.0000 mg | ORAL_TABLET | Freq: Every day | ORAL | 1 refills | Status: DC
Start: 2021-08-23 — End: 2022-02-21

## 2021-08-23 MED ORDER — CLOTRIMAZOLE-BETAMETHASONE 1-0.05 % EX CREA: 1.0000 "application " | TOPICAL_CREAM | Freq: Every day | CUTANEOUS | 0 refills | Status: DC

## 2021-08-23 MED ORDER — TAMSULOSIN HCL 0.4 MG PO CAPS
0.4000 mg | ORAL_CAPSULE | Freq: Every day | ORAL | 1 refills | Status: DC
Start: 2021-08-23 — End: 2022-02-21

## 2021-08-23 MED ORDER — AMLODIPINE BESYLATE 5 MG PO TABS: 5.0000 mg | ORAL_TABLET | Freq: Every day | ORAL | 1 refills | Status: DC

## 2021-08-23 MED ORDER — ATORVASTATIN CALCIUM 40 MG PO TABS
40.0000 mg | ORAL_TABLET | Freq: Every day | ORAL | 1 refills | Status: DC
Start: 2021-08-23 — End: 2022-02-21

## 2021-08-23 NOTE — Assessment & Plan Note (Signed)
Under good control on current regimen. Continue current regimen. Continue to monitor. Call with any concerns. Refills given. Labs drawn today.   

## 2021-08-23 NOTE — Progress Notes (Signed)
BP 121/72    Pulse 67    Temp 98.2 F (36.8 C)    Ht 5' 9.5" (1.765 m)    Wt 194 lb 12.8 oz (88.4 kg)    SpO2 98%    BMI 28.35 kg/m    Subjective:    Patient ID: Scott Hester, male    DOB: 1956/06/16, 66 y.o.   MRN: [id number]  HPI: Scott Hester is a 66 y.o. male presenting on 08/23/2021 for comprehensive medical examination. Current medical complaints include:  DIABETES Hypoglycemic episodes:no Polydipsia/polyuria: no Visual disturbance: no Chest pain: no Paresthesias: no Glucose Monitoring: no  Accucheck frequency:  occasionally Taking Insulin?: no Blood Pressure Monitoring: not checking Retinal Examination: Not up to Date Foot Exam: Up to Date Diabetic Education: Completed Pneumovax: Up to Date Influenza: Up to Date Aspirin: no  HYPERTENSION / [place] Satisfied with current treatment? yes Duration of hypertension: chronic BP monitoring frequency: not checking BP medication side effects: no Past BP meds: amlodipine, benazepril Duration of hyperlipidemia: chronic Cholesterol medication side effects: no Cholesterol supplements: none Past cholesterol medications: atorvastatin Medication compliance: excellent compliance Aspirin: no Recent stressors: no Recurrent headaches: no Visual changes: no Palpitations: no Dyspnea: no Chest pain: no Lower extremity edema: no Dizzy/lightheaded: no  BPH BPH status: stable Satisfied with current treatment?: no Medication side effects: no Medication compliance: excellent compliance Duration: chronic Nocturia: 1-2x per night Urinary frequency:yes Incomplete voiding: no Urgency: no Weak urinary stream: no Straining to start stream: no Dysuria: no Onset: gradual Severity: mild  He currently lives with: wife Interim Problems from his last visit: no  Depression Screen done today and results listed below:  Depression screen [facility] 2/9 08/23/2021 07/15/2020 06/16/2019 05/08/2018 10/31/2017  Decreased Interest 0 0 0 0  0  Down, Depressed, Hopeless 0 0 0 0 0  PHQ - 2 Score 0 0 0 0 0  Altered sleeping 0 - - - -  Tired, decreased energy 0 - - - -  Change in appetite 0 - - - -  Feeling bad or failure about yourself  0 - - - -  Trouble concentrating 0 - - - -  Moving slowly or fidgety/restless 0 - - - -  Suicidal thoughts 0 - - - -  PHQ-9 Score 0 - - - -     Past Medical History:  Past Medical History:  Diagnosis Date   BPH (benign prostatic hyperplasia)    Hypertension     Surgical History:  History reviewed. No pertinent surgical history.  Medications:  No current outpatient medications on file prior to visit.   No current facility-administered medications on file prior to visit.    Allergies:  No Known Allergies  Social History:  Social History   Socioeconomic History   Marital status: Single    Spouse name: Not on file   Number of children: Not on file   Years of education: Not on file   Highest education level: Not on file  Occupational History   Not on file  Tobacco Use   Smoking status: Never   Smokeless tobacco: Never  Vaping Use   Vaping Use: Never used  Substance and Sexual Activity   Alcohol use: No   Drug use: No   Sexual activity: Yes  Other Topics Concern   Not on file  Social History Narrative   Not on file   Social Determinants of Health   Financial Resource Strain: Not on file  Food Insecurity: Not on file  Transportation  Needs: Not on file  Physical Activity: Not on file  Stress: Not on file  Social Connections: Not on file  Intimate Partner Violence: Not on file   Social History   Tobacco Use  Smoking Status Never  Smokeless Tobacco Never   Social History   Substance and Sexual Activity  Alcohol Use No    Family History:  Family History  Problem Relation Age of Onset   Hypertension Mother    Hypertension Father    Diabetes Father    Hypertension Maternal Grandmother    Diabetes Maternal Grandmother     Past medical history,  surgical history, medications, allergies, family history and social history reviewed with patient today and changes made to appropriate areas of the chart.   Review of Systems  Constitutional: Negative.   HENT: Negative.    Eyes: Negative.   Respiratory: Negative.    Cardiovascular: Negative.   Gastrointestinal: Negative.   Genitourinary:  Positive for frequency. Negative for dysuria, flank pain, hematuria and urgency.  Musculoskeletal: Negative.   Skin:  Positive for rash. Negative for itching.  Neurological: Negative.   Endo/Heme/Allergies: Negative.   Psychiatric/Behavioral: Negative.    All other ROS negative except what is listed above and in the HPI.      Objective:    BP 121/72    Pulse 67    Temp 98.2 F (36.8 C)    Ht 5' 9.5" (1.765 m)    Wt 194 lb 12.8 oz (88.4 kg)    SpO2 98%    BMI 28.35 kg/m   Wt Readings from Last 3 Encounters:  08/23/21 194 lb 12.8 oz (88.4 kg)  01/12/21 194 lb 3.2 oz (88.1 kg)  07/15/20 192 lb 12.8 oz (87.5 kg)    Physical Exam Vitals and nursing note reviewed.  Constitutional:      General: He is not in acute distress.    Appearance: Normal appearance. He is obese. He is not ill-appearing, toxic-appearing or diaphoretic.  HENT:     Head: Normocephalic and atraumatic.     Right Ear: Tympanic membrane, ear canal and external ear normal. There is no impacted cerumen.     Left Ear: Tympanic membrane, ear canal and external ear normal. There is no impacted cerumen.     Nose: Nose normal. No congestion or rhinorrhea.     Mouth/Throat:     Mouth: Mucous membranes are moist.     Pharynx: Oropharynx is clear. No oropharyngeal exudate or posterior oropharyngeal erythema.  Eyes:     General: No scleral icterus.       Right eye: No discharge.        Left eye: No discharge.     Extraocular Movements: Extraocular movements intact.     Conjunctiva/sclera: Conjunctivae normal.     Pupils: Pupils are equal, round, and reactive to light.  Neck:      Vascular: No carotid bruit.  Cardiovascular:     Rate and Rhythm: Normal rate and regular rhythm.     Pulses: Normal pulses.     Heart sounds: No murmur heard.   No friction rub. No gallop.  Pulmonary:     Effort: Pulmonary effort is normal. No respiratory distress.     Breath sounds: Normal breath sounds. No stridor. No wheezing, rhonchi or rales.  Chest:     Chest wall: No tenderness.  Abdominal:     General: Abdomen is flat. Bowel sounds are normal. There is no distension.     Palpations: Abdomen is soft.  There is no mass.     Tenderness: There is no abdominal tenderness. There is no right CVA tenderness, left CVA tenderness, guarding or rebound.     Hernia: No hernia is present.  Genitourinary:    Comments: Genital exam deferred with shared decision making Musculoskeletal:        General: No swelling, tenderness, deformity or signs of injury.     Cervical back: Normal range of motion and neck supple. No rigidity. No muscular tenderness.     Right lower leg: No edema.     Left lower leg: No edema.  Lymphadenopathy:     Cervical: No cervical adenopathy.  Skin:    General: Skin is warm and dry.     Capillary Refill: Capillary refill takes less than 2 seconds.     Coloration: Skin is not jaundiced or pale.     Findings: No bruising, erythema, lesion or rash.  Neurological:     General: No focal deficit present.     Mental Status: He is alert and oriented to person, place, and time.     Cranial Nerves: No cranial nerve deficit.     Sensory: No sensory deficit.     Motor: No weakness.     Coordination: Coordination normal.     Gait: Gait normal.     Deep Tendon Reflexes: Reflexes normal.  Psychiatric:        Mood and Affect: Mood normal.        Behavior: Behavior normal.        Thought Content: Thought content normal.        Judgment: Judgment normal.    Results for orders placed or performed in visit on 01/12/21  Bayer DCA Hb A1c Waived  Result Value Ref Range   HB A1C  (BAYER DCA - WAIVED) 7.1 (H) <7.0 %  Comprehensive metabolic panel  Result Value Ref Range   Glucose 152 (H) 65 - 99 mg/dL   BUN 13 8 - 27 mg/dL   Creatinine, Ser 0.73 (L) 0.76 - 1.27 mg/dL   eGFR 102 >59 mL/min/1.73   BUN/Creatinine Ratio 18 10 - 24   Sodium 140 134 - 144 mmol/L   Potassium 4.5 3.5 - 5.2 mmol/L   Chloride 104 96 - 106 mmol/L   CO2 26 20 - 29 mmol/L   Calcium 8.9 8.6 - 10.2 mg/dL   Total Protein 6.8 6.0 - 8.5 g/dL   Albumin 4.5 3.8 - 4.8 g/dL   Globulin, Total 2.3 1.5 - 4.5 g/dL   Albumin/Globulin Ratio 2.0 1.2 - 2.2   Bilirubin Total 0.7 0.0 - 1.2 mg/dL   Alkaline Phosphatase 58 44 - 121 IU/L   AST 17 0 - 40 IU/L   ALT 25 0 - 44 IU/L  Lipid Panel w/o Chol/HDL Ratio  Result Value Ref Range   Cholesterol, Total 119 100 - 199 mg/dL   Triglycerides 73 0 - 149 mg/dL   HDL 41 >39 mg/dL   VLDL Cholesterol Cal 15 5 - 40 mg/dL   LDL Chol Calc (NIH) 63 0 - 99 mg/dL      Assessment & Plan:   Problem List Items Addressed This Visit       Cardiovascular and Mediastinum   Essential hypertension    Under good control on current regimen. Continue current regimen. Continue to monitor. Call with any concerns. Refills given. Labs drawn today.        Relevant Medications   benazepril (LOTENSIN) 40 MG tablet   atorvastatin (  LIPITOR) 40 MG tablet   amLODipine (NORVASC) 5 MG tablet   Other Relevant Orders   Comprehensive metabolic panel   CBC with Differential/Platelet   TSH   Microalbumin, Urine Waived     Endocrine   Controlled type 2 diabetes mellitus without complication, without long-term current use of insulin (HCC)    Up slightly with A1c of 7.6. Will increase his metformin to BID and recheck 3 months. Call with any concerns. Continue to monitor.       Relevant Medications   metFORMIN (GLUCOPHAGE-XR) 750 MG 24 hr tablet   benazepril (LOTENSIN) 40 MG tablet   atorvastatin (LIPITOR) 40 MG tablet   Other Relevant Orders   Comprehensive metabolic panel    CBC with Differential/Platelet   Urinalysis, Routine w reflex microscopic   Bayer DCA Hb A1c Waived   Microalbumin, Urine Waived     Genitourinary   BPH with obstruction/lower urinary tract symptoms    Under good control on current regimen. Continue current regimen. Continue to monitor. Call with any concerns. Refills given. Labs drawn today.        Relevant Medications   tamsulosin (FLOMAX) 0.4 MG CAPS capsule   finasteride (PROSCAR) 5 MG tablet   Other Relevant Orders   Comprehensive metabolic panel   CBC with Differential/Platelet   PSA     Other   Hypercholesteremia    Under good control on current regimen. Continue current regimen. Continue to monitor. Call with any concerns. Refills given. Labs drawn today.        Relevant Medications   benazepril (LOTENSIN) 40 MG tablet   atorvastatin (LIPITOR) 40 MG tablet   amLODipine (NORVASC) 5 MG tablet   Other Relevant Orders   Comprehensive metabolic panel   CBC with Differential/Platelet   Lipid Panel w/o Chol/HDL Ratio   Other Visit Diagnoses     Routine general medical examination at a health care facility    -  Primary   Vaccines up to date. Screening labs checked today. Cologuard ordered. Continue diet and exercise. Call with any concerns. Continue to monitor.    Relevant Orders   Comprehensive metabolic panel   CBC with Differential/Platelet   Lipid Panel w/o Chol/HDL Ratio   PSA   TSH   Urinalysis, Routine w reflex microscopic   Bayer DCA Hb A1c Waived   Microalbumin, Urine Waived   Screening for colon cancer       Cologuard ordered today.    Relevant Orders   Cologuard        LABORATORY TESTING:  Health maintenance labs ordered today as discussed above.   The natural history of prostate cancer and ongoing controversy regarding screening and potential treatment outcomes of prostate cancer has been discussed with the patient. The meaning of a false positive PSA and a false negative PSA has been discussed.  He indicates understanding of the limitations of this screening test and wishes to proceed with screening PSA testing.   IMMUNIZATIONS:   - Tdap: Tetanus vaccination status reviewed: last tetanus booster within 10 years. - Influenza: Up to date - Pneumovax: Up to date - Prevnar: Not applicable - COVID: Up to date - HPV: Not applicable - Shingrix vaccine: Administered today  SCREENING: - Colonoscopy: cologuard ordered today  Discussed with patient purpose of the colonoscopy is to detect colon cancer at curable precancerous or early stages   PATIENT COUNSELING:    Sexuality: Discussed sexually transmitted diseases, partner selection, use of condoms, avoidance of unintended pregnancy  and contraceptive  alternatives.   Advised to avoid cigarette smoking.  I discussed with the patient that most people either abstain from alcohol or drink within safe limits (<=14/week and <=4 drinks/occasion for males, <=7/weeks and <= 3 drinks/occasion for females) and that the risk for alcohol disorders and other health effects rises proportionally with the number of drinks per week and how often a drinker exceeds daily limits.  Discussed cessation/primary prevention of drug use and availability of treatment for abuse.   Diet: Encouraged to adjust caloric intake to maintain  or achieve ideal body weight, to reduce intake of dietary saturated fat and total fat, to limit sodium intake by avoiding high sodium foods and not adding table salt, and to maintain adequate dietary potassium and calcium preferably from fresh fruits, vegetables, and low-fat dairy products.    stressed the importance of regular exercise  Injury prevention: Discussed safety belts, safety helmets, smoke detector, smoking near bedding or upholstery.   Dental health: Discussed importance of regular tooth brushing, flossing, and dental visits.   Follow up plan: NEXT PREVENTATIVE PHYSICAL DUE IN 1 YEAR. Return in about 3 months (around  11/20/2021).

## 2021-08-23 NOTE — Assessment & Plan Note (Signed)
Up slightly with A1c of 7.6. Will increase his metformin to BID and recheck 3 months. Call with any concerns. Continue to monitor.

## 2021-08-24 ENCOUNTER — Encounter: Payer: Self-pay | Admitting: Family Medicine

## 2021-08-24 LAB — CBC WITH DIFFERENTIAL/PLATELET
Basophils Absolute: 0.1 10*3/uL (ref 0.0–0.2)
Basos: 1 %
EOS (ABSOLUTE): 0.1 10*3/uL (ref 0.0–0.4)
Eos: 2 %
Hematocrit: 42.1 % (ref 37.5–51.0)
Hemoglobin: 14.1 g/dL (ref 13.0–17.7)
Immature Grans (Abs): 0 10*3/uL (ref 0.0–0.1)
Immature Granulocytes: 0 %
Lymphocytes Absolute: 1.6 10*3/uL (ref 0.7–3.1)
Lymphs: 27 %
MCH: 29.5 pg (ref 26.6–33.0)
MCHC: 33.5 g/dL (ref 31.5–35.7)
MCV: 88 fL (ref 79–97)
Monocytes Absolute: 0.4 10*3/uL (ref 0.1–0.9)
Monocytes: 6 %
Neutrophils Absolute: 3.7 10*3/uL (ref 1.4–7.0)
Neutrophils: 64 %
Platelets: 217 10*3/uL (ref 150–450)
RBC: 4.78 x10E6/uL (ref 4.14–5.80)
RDW: 12.1 % (ref 11.6–15.4)
WBC: 5.8 10*3/uL (ref 3.4–10.8)

## 2021-08-24 LAB — COMPREHENSIVE METABOLIC PANEL
ALT: 20 IU/L (ref 0–44)
AST: 18 IU/L (ref 0–40)
Albumin/Globulin Ratio: 1.8 (ref 1.2–2.2)
Albumin: 4.4 g/dL (ref 3.8–4.8)
Alkaline Phosphatase: 59 IU/L (ref 44–121)
BUN/Creatinine Ratio: 19 (ref 10–24)
BUN: 14 mg/dL (ref 8–27)
Bilirubin Total: 0.8 mg/dL (ref 0.0–1.2)
CO2: 26 mmol/L (ref 20–29)
Calcium: 9.4 mg/dL (ref 8.6–10.2)
Chloride: 101 mmol/L (ref 96–106)
Creatinine, Ser: 0.74 mg/dL — ABNORMAL LOW (ref 0.76–1.27)
Globulin, Total: 2.4 g/dL (ref 1.5–4.5)
Glucose: 171 mg/dL — ABNORMAL HIGH (ref 70–99)
Potassium: 4.6 mmol/L (ref 3.5–5.2)
Sodium: 139 mmol/L (ref 134–144)
Total Protein: 6.8 g/dL (ref 6.0–8.5)
eGFR: 101 mL/min/{1.73_m2} (ref >59)

## 2021-08-24 LAB — LIPID PANEL W/O CHOL/HDL RATIO
Cholesterol, Total: 131 mg/dL (ref 100–199)
HDL: 42 mg/dL (ref >39)
LDL Chol Calc (NIH): 72 mg/dL (ref 0–99)
Triglycerides: 85 mg/dL (ref 0–149)
VLDL Cholesterol Cal: 17 mg/dL (ref 5–40)

## 2021-08-24 LAB — TSH: TSH: 1.13 u[IU]/mL (ref 0.450–4.500)

## 2021-08-24 LAB — PSA: Prostate Specific Ag, Serum: 0.6 ng/mL (ref 0.0–4.0)

## 2021-10-14 ENCOUNTER — Other Ambulatory Visit: Payer: Self-pay | Admitting: Family Medicine

## 2021-10-14 DIAGNOSIS — I10 Essential (primary) hypertension: Secondary | ICD-10-CM

## 2021-10-14 NOTE — Telephone Encounter (Signed)
Has newer rx at same pharm, please fill. ?Requested Prescriptions  ?Pending Prescriptions Disp Refills  ?? benazepril (LOTENSIN) 40 MG tablet [Pharmacy Med Name: BENAZEPRIL HCL 40MG  TABS] 90 tablet 0  ?  Sig: TAKE ONE TABLET BY MOUTH ONE TIME DAILY  ?  ? Cardiovascular:  ACE Inhibitors Failed - 10/14/2021  9:49 AM  ?  ?  Failed - Cr in normal range and within 180 days  ?  Creatinine, Ser  ?Date Value Ref Range Status  ?08/23/2021 0.74 (L) 0.76 - 1.27 mg/dL Final  ?   ?  ?  Passed - K in normal range and within 180 days  ?  Potassium  ?Date Value Ref Range Status  ?08/23/2021 4.6 3.5 - 5.2 mmol/L Final  ?   ?  ?  Passed - Patient is not pregnant  ?  ?  Passed - Last BP in normal range  ?  BP Readings from Last 1 Encounters:  ?08/23/21 121/72  ?   ?  ?  Passed - Valid encounter within last 6 months  ?  Recent Outpatient Visits   ?      ? 1 month ago Routine general medical examination at a health care facility  ? [facility] [place], [place], DO  ? 2 months ago Sinusitis, unspecified chronicity, unspecified location  ? [facility] [place], [place], MD  ? 9 months ago Controlled type 2 diabetes mellitus without complication, without long-term current use of insulin (HCC)  ? [facility] [place], [place], DO  ? 1 year ago Routine general medical examination at a health care facility  ? [facility] [place], [place], DO  ? 1 year ago Controlled type 2 diabetes mellitus without complication, without long-term current use of insulin (HCC)  ? [facility] [place], [place], DO  ?  ?  ?Future Appointments   ?        ? In 1 month [place], [place], DO [facility]  ?  ? ?  ?  ?  ? ? ?

## 2021-11-21 ENCOUNTER — Ambulatory Visit (INDEPENDENT_AMBULATORY_CARE_PROVIDER_SITE_OTHER): Payer: Medicare HMO | Admitting: Family Medicine

## 2021-11-21 ENCOUNTER — Encounter: Payer: Self-pay | Admitting: Family Medicine

## 2021-11-21 VITALS — BP 118/68 | HR 60 | Temp 97.7°F | Wt 188.0 lb

## 2021-11-21 DIAGNOSIS — E119 Type 2 diabetes mellitus without complications: Secondary | ICD-10-CM

## 2021-11-21 DIAGNOSIS — Z1211 Encounter for screening for malignant neoplasm of colon: Secondary | ICD-10-CM

## 2021-11-21 LAB — BAYER DCA HB A1C WAIVED: HB A1C (BAYER DCA - WAIVED): 6.4 % — ABNORMAL HIGH (ref 4.8–5.6)

## 2021-11-21 NOTE — Progress Notes (Signed)
? ?BP 118/68   Pulse 60   Temp 97.7 ?F (36.5 ?C)   Wt 188 lb (85.3 kg)   SpO2 98%   BMI 27.36 kg/m?   ? ?Subjective:  ? ? Patient ID: Scott Hester, male    DOB: 08-Dec-1955, 66 y.o.   MRN: [id number] ? ?HPI: ?Scott Hester is a 66 y.o. male ? ?Chief Complaint  ?Patient presents with  ? Diabetes  ?  Patient has not had eye exam this year.   ? ?DIABETES ?Hypoglycemic episodes:no ?Polydipsia/polyuria: no ?Visual disturbance: no ?Chest pain: no ?Paresthesias: no ?Glucose Monitoring: no ?Taking Insulin?: no ?Blood Pressure Monitoring: not checking ?Retinal Examination: Not up to Date ?Foot Exam: Up to Date ?Diabetic Education: Completed ?Pneumovax: Up to Date ?Influenza: Up to Date ?Aspirin: no ? ? ?Relevant past medical, surgical, family and social history reviewed and updated as indicated. Interim medical history since our last visit reviewed. ?Allergies and medications reviewed and updated. ? ?Review of Systems  ?Constitutional: Negative.   ?Respiratory: Negative.    ?Cardiovascular: Negative.   ?Gastrointestinal: Negative.   ?Musculoskeletal: Negative.   ?Psychiatric/Behavioral: Negative.    ? ?Per HPI unless specifically indicated above ? ?   ?Objective:  ?  ?BP 118/68   Pulse 60   Temp 97.7 ?F (36.5 ?C)   Wt 188 lb (85.3 kg)   SpO2 98%   BMI 27.36 kg/m?   ?Wt Readings from Last 3 Encounters:  ?11/21/21 188 lb (85.3 kg)  ?08/23/21 194 lb 12.8 oz (88.4 kg)  ?01/12/21 194 lb 3.2 oz (88.1 kg)  ?  ?Physical Exam ?Vitals and nursing note reviewed.  ?Constitutional:   ?   General: He is not in acute distress. ?   Appearance: Normal appearance. He is not ill-appearing, toxic-appearing or diaphoretic.  ?HENT:  ?   Head: Normocephalic and atraumatic.  ?   Right Ear: External ear normal.  ?   Left Ear: External ear normal.  ?   Nose: Nose normal.  ?   Mouth/Throat:  ?   Mouth: Mucous membranes are moist.  ?   Pharynx: Oropharynx is clear.  ?Eyes:  ?   General: No scleral icterus.    ?   Right eye: No discharge.      ?   Left eye: No discharge.  ?   Extraocular Movements: Extraocular movements intact.  ?   Conjunctiva/sclera: Conjunctivae normal.  ?   Pupils: Pupils are equal, round, and reactive to light.  ?Cardiovascular:  ?   Rate and Rhythm: Normal rate and regular rhythm.  ?   Pulses: Normal pulses.  ?   Heart sounds: Normal heart sounds. No murmur heard. ?  No friction rub. No gallop.  ?Pulmonary:  ?   Effort: Pulmonary effort is normal. No respiratory distress.  ?   Breath sounds: Normal breath sounds. No stridor. No wheezing, rhonchi or rales.  ?Chest:  ?   Chest wall: No tenderness.  ?Musculoskeletal:     ?   General: Normal range of motion.  ?   Cervical back: Normal range of motion and neck supple.  ?Skin: ?   General: Skin is warm and dry.  ?   Capillary Refill: Capillary refill takes less than 2 seconds.  ?   Coloration: Skin is not jaundiced or pale.  ?   Findings: No bruising, erythema, lesion or rash.  ?Neurological:  ?   General: No focal deficit present.  ?   Mental Status: He is alert and oriented  to person, place, and time. Mental status is at baseline.  ?Psychiatric:     ?   Mood and Affect: Mood normal.     ?   Behavior: Behavior normal.     ?   Thought Content: Thought content normal.     ?   Judgment: Judgment normal.  ? ? ?Results for orders placed or performed in visit on 11/21/21  ?Bayer DCA Hb A1c Waived  ?Result Value Ref Range  ? HB A1C (BAYER DCA - WAIVED) 6.4 (H) 4.8 - 5.6 %  ? ?   ?Assessment & Plan:  ? ?Problem List Items Addressed This Visit   ? ?  ? Endocrine  ? Controlled type 2 diabetes mellitus without complication, without long-term current use of insulin (HCC) - Primary  ?  Doing great with A1c of 6.4. Continue current regimen. Call with any concerns. Refills up to date.  ? ?  ?  ? Relevant Orders  ? Bayer DCA Hb A1c Waived (Completed)  ? ?Other Visit Diagnoses   ? ? Screening for colon cancer      ? Cologuard ordered today. Await results.   ? Relevant Orders  ? Cologuard  ? ?  ?   ? ?Follow up plan: ?Return in about 3 months (around 02/21/2022). ? ? ? ? ? ?

## 2021-11-21 NOTE — Assessment & Plan Note (Signed)
Doing great with A1c of 6.4. Continue current regimen. Call with any concerns. Refills up to date.  ?

## 2022-02-20 DIAGNOSIS — E119 Type 2 diabetes mellitus without complications: Secondary | ICD-10-CM | POA: Diagnosis not present

## 2022-02-20 DIAGNOSIS — Z0100 Encounter for examination of eyes and vision without abnormal findings: Secondary | ICD-10-CM | POA: Diagnosis not present

## 2022-02-20 LAB — HM DIABETES EYE EXAM

## 2022-02-21 ENCOUNTER — Encounter: Payer: Self-pay | Admitting: Family Medicine

## 2022-02-21 ENCOUNTER — Ambulatory Visit (INDEPENDENT_AMBULATORY_CARE_PROVIDER_SITE_OTHER): Payer: Medicare HMO | Admitting: Family Medicine

## 2022-02-21 VITALS — BP 106/62 | HR 61 | Temp 98.1°F | Wt 183.1 lb

## 2022-02-21 DIAGNOSIS — N401 Enlarged prostate with lower urinary tract symptoms: Secondary | ICD-10-CM | POA: Diagnosis not present

## 2022-02-21 DIAGNOSIS — S41101A Unspecified open wound of right upper arm, initial encounter: Secondary | ICD-10-CM | POA: Diagnosis not present

## 2022-02-21 DIAGNOSIS — N138 Other obstructive and reflux uropathy: Secondary | ICD-10-CM

## 2022-02-21 DIAGNOSIS — I10 Essential (primary) hypertension: Secondary | ICD-10-CM | POA: Diagnosis not present

## 2022-02-21 DIAGNOSIS — E7800 Pure hypercholesterolemia, unspecified: Secondary | ICD-10-CM | POA: Diagnosis not present

## 2022-02-21 DIAGNOSIS — R0981 Nasal congestion: Secondary | ICD-10-CM

## 2022-02-21 DIAGNOSIS — I952 Hypotension due to drugs: Secondary | ICD-10-CM | POA: Diagnosis not present

## 2022-02-21 DIAGNOSIS — Z23 Encounter for immunization: Secondary | ICD-10-CM | POA: Diagnosis not present

## 2022-02-21 DIAGNOSIS — E119 Type 2 diabetes mellitus without complications: Secondary | ICD-10-CM

## 2022-02-21 LAB — BAYER DCA HB A1C WAIVED: HB A1C (BAYER DCA - WAIVED): 6.5 % — ABNORMAL HIGH (ref 4.8–5.6)

## 2022-02-21 MED ORDER — FINASTERIDE 5 MG PO TABS
5.0000 mg | ORAL_TABLET | Freq: Every day | ORAL | 1 refills | Status: DC
Start: 2022-02-21 — End: 2022-08-24

## 2022-02-21 MED ORDER — ATORVASTATIN CALCIUM 40 MG PO TABS: 40.0000 mg | ORAL_TABLET | Freq: Every day | ORAL | 1 refills | Status: DC

## 2022-02-21 MED ORDER — TAMSULOSIN HCL 0.4 MG PO CAPS
0.4000 mg | ORAL_CAPSULE | Freq: Every day | ORAL | 1 refills | Status: DC
Start: 2022-02-21 — End: 2022-08-24

## 2022-02-21 MED ORDER — BENAZEPRIL HCL 40 MG PO TABS
40.0000 mg | ORAL_TABLET | Freq: Every day | ORAL | 1 refills | Status: DC
Start: 2022-02-21 — End: 2022-08-24

## 2022-02-21 MED ORDER — METFORMIN HCL ER 750 MG PO TB24
750.0000 mg | ORAL_TABLET | Freq: Two times a day (BID) | ORAL | 1 refills | Status: DC
Start: 2022-02-21 — End: 2022-08-24

## 2022-02-21 MED ORDER — PREDNISONE 50 MG PO TABS: 50.0000 mg | ORAL_TABLET | Freq: Every day | ORAL | 0 refills | Status: DC

## 2022-02-21 MED ORDER — AMLODIPINE BESYLATE 2.5 MG PO TABS: 2.5000 mg | ORAL_TABLET | Freq: Every day | ORAL | 2 refills | Status: DC

## 2022-02-21 NOTE — Assessment & Plan Note (Signed)
Overtreated. Will cut his amlodipine to 2.5mg  and recheck 1 month. Call with any concerns. Labs drawn today.

## 2022-02-21 NOTE — Assessment & Plan Note (Signed)
Under good control on current regimen. Continue current regimen. Continue to monitor. Call with any concerns. Refills given. Labs drawn today.   

## 2022-02-21 NOTE — Assessment & Plan Note (Signed)
Doing well with a1c of 6.5. Continue to monitor. Call with any concerns. Refills given.

## 2022-02-21 NOTE — Progress Notes (Signed)
BP 106/62   Pulse 61   Temp 98.1 F (36.7 C)   Wt 183 lb 1.6 oz (83.1 kg)   SpO2 99%   BMI 26.65 kg/m    Subjective:    Patient ID: Scott Hester, male    DOB: [birth year], 66 y.o.   MRN: [id number]  HPI: Scott Hester is a 65 y.o. male  Chief Complaint  Patient presents with   Diabetes    Patient states he had eye exam yesterday, sent request to [facility] eye center   Hypertension   URI    Patient states his ears feel stopped up, and has a slight cough.    DIABETES Hypoglycemic episodes:no Polydipsia/polyuria: no Visual disturbance: no Chest pain: no Paresthesias: no Glucose Monitoring: no  Accucheck frequency: Not Checking Taking Insulin?: no Blood Pressure Monitoring: not checking Retinal Examination: Not up to Date Foot Exam: Up to Date Diabetic Education: Completed Pneumovax: Up to Date Influenza: Up to Date Aspirin: no  HYPERTENSION / HYPERLIPIDEMIA Satisfied with current treatment? yes Duration of hypertension: chronic BP monitoring frequency: not checking BP medication side effects: yes- dizziness Past BP meds: benazepril, amlodipine Duration of hyperlipidemia: chronic Cholesterol medication side effects: no Cholesterol supplements: none Past cholesterol medications: atorvastatin Medication compliance: excellent compliance Aspirin: no Recent stressors: no Recurrent headaches: no Visual changes: no Palpitations: no Dyspnea: no Chest pain: no Lower extremity edema: no Dizzy/lightheaded: yes  UPPER RESPIRATORY TRACT INFECTION Duration: 3-4 days Worst symptom: congestion Fever: no Cough: yes Shortness of breath: no Wheezing: no Chest pain: no Chest tightness: no Chest congestion: no Nasal congestion: yes Runny nose: yes Post nasal drip: yes Sneezing: no Sore throat: no Swollen glands: no Sinus pressure: yes Headache: yes Face pain: no Toothache: no Ear pain: no  Ear pressure: no  Eyes red/itching:no Eye drainage/crusting: no   Vomiting: no Rash: no Fatigue: yes Sick contacts: no Strep contacts: no  Context: stable Recurrent sinusitis: no Relief with OTC cold/cough medications: no  Treatments attempted: none    Relevant past medical, surgical, family and social history reviewed and updated as indicated. Interim medical history since our last visit reviewed. Allergies and medications reviewed and updated.  Review of Systems  Constitutional: Negative.   Respiratory: Negative.    Cardiovascular: Negative.   Gastrointestinal: Negative.   Musculoskeletal: Negative.   Neurological:  Positive for dizziness and light-headedness. Negative for tremors, seizures, syncope, facial asymmetry, speech difficulty, weakness, numbness and headaches.  Psychiatric/Behavioral: Negative.      Per HPI unless specifically indicated above     Objective:    BP 106/62   Pulse 61   Temp 98.1 F (36.7 C)   Wt 183 lb 1.6 oz (83.1 kg)   SpO2 99%   BMI 26.65 kg/m   Wt Readings from Last 3 Encounters:  02/21/22 183 lb 1.6 oz (83.1 kg)  11/21/21 188 lb (85.3 kg)  08/23/21 194 lb 12.8 oz (88.4 kg)    Physical Exam Vitals and nursing note reviewed.  Constitutional:      General: He is not in acute distress.    Appearance: Normal appearance. He is not ill-appearing, toxic-appearing or diaphoretic.  HENT:     Head: Normocephalic and atraumatic.     Right Ear: External ear normal.     Left Ear: External ear normal.     Nose: Nose normal.     Mouth/Throat:     Mouth: Mucous membranes are moist.     Pharynx: Oropharynx is clear.  Eyes:  General: No scleral icterus.       Right eye: No discharge.        Left eye: No discharge.     Extraocular Movements: Extraocular movements intact.     Conjunctiva/sclera: Conjunctivae normal.     Pupils: Pupils are equal, round, and reactive to light.  Cardiovascular:     Rate and Rhythm: Normal rate and regular rhythm.     Pulses: Normal pulses.     Heart sounds: Normal heart  sounds. No murmur heard.    No friction rub. No gallop.  Pulmonary:     Effort: Pulmonary effort is normal. No respiratory distress.     Breath sounds: Normal breath sounds. No stridor. No wheezing, rhonchi or rales.  Chest:     Chest wall: No tenderness.  Musculoskeletal:        General: Normal range of motion.     Cervical back: Normal range of motion and neck supple.  Skin:    General: Skin is warm and dry.     Capillary Refill: Capillary refill takes less than 2 seconds.     Coloration: Skin is not jaundiced or pale.     Findings: No bruising, erythema, lesion or rash.  Neurological:     General: No focal deficit present.     Mental Status: He is alert and oriented to person, place, and time. Mental status is at baseline.  Psychiatric:        Mood and Affect: Mood normal.        Behavior: Behavior normal.        Thought Content: Thought content normal.        Judgment: Judgment normal.     Results for orders placed or performed in visit on 11/21/21  Bayer DCA Hb A1c Waived  Result Value Ref Range   HB A1C (BAYER DCA - WAIVED) 6.4 (H) 4.8 - 5.6 %      Assessment & Plan:   Problem List Items Addressed This Visit       Cardiovascular and Mediastinum   Essential hypertension    Overtreated. Will cut his amlodipine to 2.5mg  and recheck 1 month. Call with any concerns. Labs drawn today.       Relevant Medications   amLODipine (NORVASC) 2.5 MG tablet   atorvastatin (LIPITOR) 40 MG tablet   benazepril (LOTENSIN) 40 MG tablet   Other Relevant Orders   CBC with Differential/Platelet   Comprehensive metabolic panel     Endocrine   Controlled type 2 diabetes mellitus without complication, without long-term current use of insulin (HCC) - Primary    Doing well with a1c of 6.5. Continue to monitor. Call with any concerns. Refills given.       Relevant Medications   atorvastatin (LIPITOR) 40 MG tablet   benazepril (LOTENSIN) 40 MG tablet   metFORMIN (GLUCOPHAGE-XR) 750  MG 24 hr tablet   Other Relevant Orders   Bayer DCA Hb A1c Waived   CBC with Differential/Platelet   Comprehensive metabolic panel     Genitourinary   BPH with obstruction/lower urinary tract symptoms    Under good control on current regimen. Continue current regimen. Continue to monitor. Call with any concerns. Refills given. Labs drawn today.        Relevant Medications   finasteride (PROSCAR) 5 MG tablet   tamsulosin (FLOMAX) 0.4 MG CAPS capsule     Other   Hypercholesteremia    Under good control on current regimen. Continue current regimen. Continue to monitor. Call  with any concerns. Refills given. Labs drawn today.        Relevant Medications   amLODipine (NORVASC) 2.5 MG tablet   atorvastatin (LIPITOR) 40 MG tablet   benazepril (LOTENSIN) 40 MG tablet   Other Relevant Orders   CBC with Differential/Platelet   Comprehensive metabolic panel   Lipid Panel w/o Chol/HDL Ratio   Other Visit Diagnoses     Hypotension due to drugs       Will cut his amlodipine to 2.5mg  and recheck 1 month. Call with any concerns.    Relevant Medications   amLODipine (NORVASC) 2.5 MG tablet   atorvastatin (LIPITOR) 40 MG tablet   benazepril (LOTENSIN) 40 MG tablet   Nasal congestion       Will treat with prednisone burst. Call if not getting better or getting worse.   Need for vaccination for pneumococcus       Prevnar given today.   Relevant Orders   Pneumococcal conjugate vaccine 20-valent (Prevnar 20) (Completed)   Open wound of right upper extremity, initial encounter       Consistent with bug bite. Due for Td. Given today.    Relevant Orders   Td : Tetanus/diphtheria >7yo Preservative  free (Completed)        Follow up plan: Return in about 4 weeks (around 03/21/2022) for follow up bp.

## 2022-02-22 ENCOUNTER — Encounter: Payer: Self-pay | Admitting: Family Medicine

## 2022-02-22 LAB — COMPREHENSIVE METABOLIC PANEL
ALT: 20 IU/L (ref 0–44)
AST: 15 IU/L (ref 0–40)
Albumin/Globulin Ratio: 2 (ref 1.2–2.2)
Albumin: 4.4 g/dL (ref 3.9–4.9)
Alkaline Phosphatase: 48 IU/L (ref 44–121)
BUN/Creatinine Ratio: 24 (ref 10–24)
BUN: 22 mg/dL (ref 8–27)
Bilirubin Total: 0.7 mg/dL (ref 0.0–1.2)
CO2: 21 mmol/L (ref 20–29)
Calcium: 9.3 mg/dL (ref 8.6–10.2)
Chloride: 101 mmol/L (ref 96–106)
Creatinine, Ser: 0.9 mg/dL (ref 0.76–1.27)
Globulin, Total: 2.2 g/dL (ref 1.5–4.5)
Glucose: 148 mg/dL — ABNORMAL HIGH (ref 70–99)
Potassium: 4.7 mmol/L (ref 3.5–5.2)
Sodium: 140 mmol/L (ref 134–144)
Total Protein: 6.6 g/dL (ref 6.0–8.5)
eGFR: 95 mL/min/{1.73_m2} (ref >59)

## 2022-02-22 LAB — CBC WITH DIFFERENTIAL/PLATELET
Basophils Absolute: 0.1 10*3/uL (ref 0.0–0.2)
Basos: 1 %
EOS (ABSOLUTE): 0.3 10*3/uL (ref 0.0–0.4)
Eos: 3 %
Hematocrit: 39.5 % (ref 37.5–51.0)
Hemoglobin: 12.9 g/dL — ABNORMAL LOW (ref 13.0–17.7)
Immature Grans (Abs): 0 10*3/uL (ref 0.0–0.1)
Immature Granulocytes: 0 %
Lymphocytes Absolute: 1.7 10*3/uL (ref 0.7–3.1)
Lymphs: 23 %
MCH: 29.5 pg (ref 26.6–33.0)
MCHC: 32.7 g/dL (ref 31.5–35.7)
MCV: 90 fL (ref 79–97)
Monocytes Absolute: 0.5 10*3/uL (ref 0.1–0.9)
Monocytes: 6 %
Neutrophils Absolute: 4.9 10*3/uL (ref 1.4–7.0)
Neutrophils: 67 %
Platelets: 214 10*3/uL (ref 150–450)
RBC: 4.38 x10E6/uL (ref 4.14–5.80)
RDW: 12 % (ref 11.6–15.4)
WBC: 7.3 10*3/uL (ref 3.4–10.8)

## 2022-02-22 LAB — LIPID PANEL W/O CHOL/HDL RATIO
Cholesterol, Total: 118 mg/dL (ref 100–199)
HDL: 44 mg/dL (ref >39)
LDL Chol Calc (NIH): 58 mg/dL (ref 0–99)
Triglycerides: 83 mg/dL (ref 0–149)
VLDL Cholesterol Cal: 16 mg/dL (ref 5–40)

## 2022-03-28 ENCOUNTER — Ambulatory Visit (INDEPENDENT_AMBULATORY_CARE_PROVIDER_SITE_OTHER): Payer: Medicare HMO | Admitting: Family Medicine

## 2022-03-28 ENCOUNTER — Encounter: Payer: Self-pay | Admitting: Family Medicine

## 2022-03-28 DIAGNOSIS — I10 Essential (primary) hypertension: Secondary | ICD-10-CM

## 2022-03-28 NOTE — Progress Notes (Signed)
BP 117/63   Pulse (!) 56   Temp 97.8 F (36.6 C)   Wt 184 lb 7.2 oz (83.7 kg)   SpO2 97%   BMI 26.85 kg/m    Subjective:    Patient ID: Scott Hester, male    DOB: [birth year], 66 y.o.   MRN: [id number]  HPI: Scott Hester is a 66 y.o. male  Chief Complaint  Patient presents with   Hypotension   HYPERTENSION  Hypertension status: controlled  Satisfied with current treatment? yes Duration of hypertension: chronic BP monitoring frequency:  not checking BP medication side effects:  no Medication compliance: excellent compliance Previous BP meds:benazepril, amlodipine Aspirin: no Recurrent headaches: no Visual changes: no Palpitations: no Dyspnea: no Chest pain: no Lower extremity edema: no Dizzy/lightheaded: no  Relevant past medical, surgical, family and social history reviewed and updated as indicated. Interim medical history since our last visit reviewed. Allergies and medications reviewed and updated.  Review of Systems  Constitutional: Negative.   Respiratory: Negative.    Cardiovascular: Negative.   Gastrointestinal: Negative.   Musculoskeletal: Negative.   Neurological: Negative.   Psychiatric/Behavioral: Negative.      Per HPI unless specifically indicated above     Objective:    BP 117/63   Pulse (!) 56   Temp 97.8 F (36.6 C)   Wt 184 lb 7.2 oz (83.7 kg)   SpO2 97%   BMI 26.85 kg/m   Wt Readings from Last 3 Encounters:  03/28/22 184 lb 7.2 oz (83.7 kg)  02/21/22 183 lb 1.6 oz (83.1 kg)  11/21/21 188 lb (85.3 kg)    Physical Exam Vitals and nursing note reviewed.  Constitutional:      General: He is not in acute distress.    Appearance: Normal appearance. He is not ill-appearing, toxic-appearing or diaphoretic.  HENT:     Head: Normocephalic and atraumatic.     Right Ear: External ear normal.     Left Ear: External ear normal.     Nose: Nose normal.     Mouth/Throat:     Mouth: Mucous membranes are moist.     Pharynx: Oropharynx  is clear.  Eyes:     General: No scleral icterus.       Right eye: No discharge.        Left eye: No discharge.     Extraocular Movements: Extraocular movements intact.     Conjunctiva/sclera: Conjunctivae normal.     Pupils: Pupils are equal, round, and reactive to light.  Cardiovascular:     Rate and Rhythm: Normal rate and regular rhythm.     Pulses: Normal pulses.     Heart sounds: Normal heart sounds. No murmur heard.    No friction rub. No gallop.  Pulmonary:     Effort: Pulmonary effort is normal. No respiratory distress.     Breath sounds: Normal breath sounds. No stridor. No wheezing, rhonchi or rales.  Chest:     Chest wall: No tenderness.  Musculoskeletal:        General: Normal range of motion.     Cervical back: Normal range of motion and neck supple.  Skin:    General: Skin is warm and dry.     Capillary Refill: Capillary refill takes less than 2 seconds.     Coloration: Skin is not jaundiced or pale.     Findings: No bruising, erythema, lesion or rash.  Neurological:     General: No focal deficit present.  Mental Status: He is alert and oriented to person, place, and time. Mental status is at baseline.  Psychiatric:        Mood and Affect: Mood normal.        Behavior: Behavior normal.        Thought Content: Thought content normal.        Judgment: Judgment normal.     Results for orders placed or performed in visit on 02/21/22  HM DIABETES EYE EXAM  Result Value Ref Range   HM Diabetic Eye Exam No Retinopathy No Retinopathy      Assessment & Plan:   Problem List Items Addressed This Visit       Cardiovascular and Mediastinum   Essential hypertension    Still slightly overtreated. Will stop amlodipine and recheck 1 month. Call with any concerns.         Follow up plan: Return in about 4 weeks (around 04/25/2022).

## 2022-03-28 NOTE — Assessment & Plan Note (Signed)
Still slightly overtreated. Will stop amlodipine and recheck 1 month. Call with any concerns.

## 2022-03-28 NOTE — Patient Instructions (Signed)
STOP AMLODIPINE!! 

## 2022-04-25 ENCOUNTER — Encounter: Payer: Self-pay | Admitting: Family Medicine

## 2022-04-25 ENCOUNTER — Ambulatory Visit (INDEPENDENT_AMBULATORY_CARE_PROVIDER_SITE_OTHER): Payer: Medicare HMO | Admitting: Family Medicine

## 2022-04-25 VITALS — BP 122/72 | HR 60 | Temp 98.0°F | Wt 188.8 lb

## 2022-04-25 DIAGNOSIS — Z23 Encounter for immunization: Secondary | ICD-10-CM | POA: Diagnosis not present

## 2022-04-25 DIAGNOSIS — I10 Essential (primary) hypertension: Secondary | ICD-10-CM

## 2022-04-25 NOTE — Progress Notes (Signed)
BP 122/72   Pulse 60   Temp 98 F (36.7 C)   Wt 188 lb 12.8 oz (85.6 kg)   SpO2 100%   BMI 27.48 kg/m    Subjective:    Patient ID: Scott Hester, male    DOB: [birth year], 66 y.o.   MRN: [id number]  HPI: Scott Hester is a 66 y.o. male  Chief Complaint  Patient presents with   Hypertension    Amlodipine stopped at last visit 4 weeks ago    HYPOTENSION  Hypertension status: better  Satisfied with current treatment? yes Duration of hypertension: chronic BP monitoring frequency:  not checking BP medication side effects:  no Medication compliance: excellent compliance Previous BP meds:benazepril Aspirin: no Recurrent headaches: no Visual changes: no Palpitations: no Dyspnea: no Chest pain: no Lower extremity edema: no Dizzy/lightheaded: no   Relevant past medical, surgical, family and social history reviewed and updated as indicated. Interim medical history since our last visit reviewed. Allergies and medications reviewed and updated.  Review of Systems  Constitutional: Negative.   Respiratory: Negative.    Cardiovascular: Negative.   Gastrointestinal: Negative.   Musculoskeletal: Negative.   Psychiatric/Behavioral: Negative.      Per HPI unless specifically indicated above     Objective:    BP 122/72   Pulse 60   Temp 98 F (36.7 C)   Wt 188 lb 12.8 oz (85.6 kg)   SpO2 100%   BMI 27.48 kg/m   Wt Readings from Last 3 Encounters:  04/25/22 188 lb 12.8 oz (85.6 kg)  03/28/22 184 lb 7.2 oz (83.7 kg)  02/21/22 183 lb 1.6 oz (83.1 kg)    Physical Exam Vitals and nursing note reviewed.  Constitutional:      General: He is not in acute distress.    Appearance: Normal appearance. He is not ill-appearing, toxic-appearing or diaphoretic.  HENT:     Head: Normocephalic and atraumatic.     Right Ear: External ear normal.     Left Ear: External ear normal.     Nose: Nose normal.     Mouth/Throat:     Mouth: Mucous membranes are moist.     Pharynx:  Oropharynx is clear.  Eyes:     General: No scleral icterus.       Right eye: No discharge.        Left eye: No discharge.     Extraocular Movements: Extraocular movements intact.     Conjunctiva/sclera: Conjunctivae normal.     Pupils: Pupils are equal, round, and reactive to light.  Cardiovascular:     Rate and Rhythm: Normal rate and regular rhythm.     Pulses: Normal pulses.     Heart sounds: Normal heart sounds. No murmur heard.    No friction rub. No gallop.  Pulmonary:     Effort: Pulmonary effort is normal. No respiratory distress.     Breath sounds: Normal breath sounds. No stridor. No wheezing, rhonchi or rales.  Chest:     Chest wall: No tenderness.  Musculoskeletal:        General: Normal range of motion.     Cervical back: Normal range of motion and neck supple.  Skin:    General: Skin is warm and dry.     Capillary Refill: Capillary refill takes less than 2 seconds.     Coloration: Skin is not jaundiced or pale.     Findings: No bruising, erythema, lesion or rash.  Neurological:     General:  No focal deficit present.     Mental Status: He is alert and oriented to person, place, and time. Mental status is at baseline.  Psychiatric:        Mood and Affect: Mood normal.        Behavior: Behavior normal.        Thought Content: Thought content normal.        Judgment: Judgment normal.     Results for orders placed or performed in visit on 02/21/22  HM DIABETES EYE EXAM  Result Value Ref Range   HM Diabetic Eye Exam No Retinopathy No Retinopathy      Assessment & Plan:   Problem List Items Addressed This Visit       Cardiovascular and Mediastinum   Essential hypertension - Primary    Doing much better. Continue to monitor. Call with any concerns. Follow up February for 6 month follow up.      Other Visit Diagnoses     Need for influenza vaccination       Relevant Orders   Flu Vaccine QUAD High Dose(Fluad) (Completed)        Follow up  plan: Return February, follow up.

## 2022-04-25 NOTE — Assessment & Plan Note (Signed)
Doing much better. Continue to monitor. Call with any concerns. Follow up February for 6 month follow up.

## 2022-05-26 ENCOUNTER — Ambulatory Visit (INDEPENDENT_AMBULATORY_CARE_PROVIDER_SITE_OTHER): Payer: Medicare HMO | Admitting: Nurse Practitioner

## 2022-05-26 ENCOUNTER — Encounter: Payer: Self-pay | Admitting: Nurse Practitioner

## 2022-05-26 VITALS — BP 138/70 | HR 76 | Temp 98.9°F | Ht 69.49 in | Wt 191.6 lb

## 2022-05-26 DIAGNOSIS — R051 Acute cough: Secondary | ICD-10-CM | POA: Insufficient documentation

## 2022-05-26 LAB — VERITOR FLU A/B WAIVED
Influenza A: NEGATIVE
Influenza B: NEGATIVE

## 2022-05-26 MED ORDER — PREDNISONE 20 MG PO TABS: 40.0000 mg | ORAL_TABLET | Freq: Every day | ORAL | 0 refills | Status: AC

## 2022-05-26 MED ORDER — ALBUTEROL SULFATE HFA 108 (90 BASE) MCG/ACT IN AERS: 2.0000 | INHALATION_SPRAY | Freq: Four times a day (QID) | RESPIRATORY_TRACT | 0 refills | Status: DC | PRN

## 2022-05-26 MED ORDER — BENZONATATE 100 MG PO CAPS: 100.0000 mg | ORAL_CAPSULE | Freq: Three times a day (TID) | ORAL | 0 refills | Status: DC | PRN

## 2022-05-26 NOTE — Patient Instructions (Signed)

## 2022-05-26 NOTE — Progress Notes (Addendum)
Acute Office Visit  Subjective:     Patient ID: Scott Hester, male    DOB: [birth year], 66 y.o.   MRN: [id number]  Chief Complaint  Patient presents with   Cough    Patient states that he is having headaches, facial pain, ran a temp last night. This all started on Wednesday     Started with symptoms on Wednesday, 05/24/22, with cough and runny nose.  Has had a fever, 100.5.  Feels bad in morning and worse during the day.  Face feels like it is on fire.  Has not Covid or flu tested.  Is up to date on Covid vaccines and flu.  Has no energy.  History of Covid back in 2020, was hospitalized. Last A1c was 6.5% in August  Cough This is a new problem. The current episode started in the past 7 days. The problem has been unchanged. The problem occurs every few minutes. The cough is Productive of sputum. Associated symptoms include chills, a fever, headaches, myalgias, nasal congestion, postnasal drip, rhinorrhea and a sore throat. Pertinent negatives include no chest pain, ear congestion, ear pain, hemoptysis, rash, shortness of breath, sweats, weight loss or wheezing. The symptoms are aggravated by lying down (worse at night). He has tried OTC cough suppressant and rest (Tylenol) for the symptoms. The treatment provided mild relief. There is no history of asthma, bronchitis, COPD or environmental allergies.   Patient is in today for cough.  Review of Systems  Constitutional:  Positive for chills, fever and malaise/fatigue. Negative for diaphoresis and weight loss.  HENT:  Positive for congestion, postnasal drip, rhinorrhea, sinus pain and sore throat. Negative for ear discharge, ear pain, nosebleeds and tinnitus.   Respiratory:  Positive for cough. Negative for hemoptysis, shortness of breath and wheezing.   Cardiovascular:  Negative for chest pain, palpitations, orthopnea and leg swelling.  Musculoskeletal:  Positive for myalgias.  Skin:  Negative for rash.  Neurological:  Positive for  headaches.  Endo/Heme/Allergies:  Negative for environmental allergies.  Psychiatric/Behavioral: Negative.        Objective:    BP 138/70   Pulse 76   Temp 98.9 F (37.2 C) (Oral)   Ht 5' 9.49" (1.765 m)   Wt 191 lb 9.6 oz (86.9 kg)   SpO2 97%   BMI 27.90 kg/m  BP Readings from Last 3 Encounters:  05/26/22 138/70  04/25/22 122/72  03/28/22 117/63   Wt Readings from Last 3 Encounters:  05/26/22 191 lb 9.6 oz (86.9 kg)  04/25/22 188 lb 12.8 oz (85.6 kg)  03/28/22 184 lb 7.2 oz (83.7 kg)      Physical Exam Vitals and nursing note reviewed.  Constitutional:      General: He is awake. He is not in acute distress.    Appearance: He is well-developed and well-groomed. He is ill-appearing. He is not toxic-appearing.  HENT:     Head: Normocephalic and atraumatic.     Right Ear: Hearing, ear canal and external ear normal. No drainage. A middle ear effusion is present. Tympanic membrane is not injected or perforated.     Left Ear: Hearing, ear canal and external ear normal. No drainage. A middle ear effusion is present. Tympanic membrane is not injected or perforated.     Nose: Rhinorrhea present. Rhinorrhea is clear.     Right Sinus: No maxillary sinus tenderness or frontal sinus tenderness.     Left Sinus: No maxillary sinus tenderness or frontal sinus tenderness.  Mouth/Throat:     Mouth: Mucous membranes are moist.     Pharynx: Uvula midline. Posterior oropharyngeal erythema (mild with cobblestone appearance) present. No pharyngeal swelling or oropharyngeal exudate.  Eyes:     General: Lids are normal.        Right eye: No discharge.        Left eye: No discharge.     Conjunctiva/sclera: Conjunctivae normal.     Pupils: Pupils are equal, round, and reactive to light.  Neck:     Thyroid: No thyromegaly.     Vascular: No carotid bruit.  Cardiovascular:     Rate and Rhythm: Normal rate and regular rhythm.     Heart sounds: Normal heart sounds, S1 normal and S2 normal.  No murmur heard.    No gallop.  Pulmonary:     Effort: Pulmonary effort is normal. No accessory muscle usage or respiratory distress.     Breath sounds: Normal breath sounds.  Abdominal:     General: Bowel sounds are normal.     Palpations: Abdomen is soft. There is no hepatomegaly or splenomegaly.  Musculoskeletal:        General: Normal range of motion.     Cervical back: Normal range of motion and neck supple.     Right lower leg: No edema.     Left lower leg: No edema.  Lymphadenopathy:     Cervical: No cervical adenopathy.  Skin:    General: Skin is warm and dry.     Capillary Refill: Capillary refill takes less than 2 seconds.  Neurological:     Mental Status: He is alert and oriented to person, place, and time.     Deep Tendon Reflexes: Reflexes are normal and symmetric.  Psychiatric:        Attention and Perception: Attention normal.        Mood and Affect: Mood normal.        Speech: Speech normal.        Behavior: Behavior normal. Behavior is cooperative.        Thought Content: Thought content normal.    No results found for any visits on 05/26/22.     Assessment & Plan:   Problem List Items Addressed This Visit       Other   Acute cough - Primary    Starting 3 days ago, 05/23/22.  Has not Covid tested at home, has been febrile.  Obtain Covid and flu testing in office today.  Scripts for Prednisone (last A1c stable and educated him to monitor sugars with this closely, as may push levels up a little) 40 MG for 5 days, Tessalon as needed, and Albuterol inhaler PRN.  If Covid or flu present will treat as indicated.  Recommend: - Increased rest - Increasing Fluids - Acetaminophen / ibuprofen as needed for fever/pain.  - Salt water gargling, chloraseptic spray and throat lozenges - Mucinex. Coricidin. - Humidifying the air.  If not feeling better by next Tuesday and if Covid negative (flu negative today) then will send in abx therapy.  He is aware to alert  provider.       Relevant Orders   Veritor Flu A/B Waived   Novel Coronavirus, NAA ([facility])    Meds ordered this encounter  Medications   predniSONE (DELTASONE) 20 MG tablet    Sig: Take 2 tablets (40 mg total) by mouth daily with breakfast for 5 days.    Dispense:  10 tablet    Refill:  0  albuterol (VENTOLIN HFA) 108 (90 Base) MCG/ACT inhaler    Sig: Inhale 2 puffs into the lungs every 6 (six) hours as needed for wheezing or shortness of breath.    Dispense:  18 g    Refill:  0   benzonatate (TESSALON) 100 MG capsule    Sig: Take 1 capsule (100 mg total) by mouth 3 (three) times daily as needed for cough.    Dispense:  45 capsule    Refill:  0    Return if symptoms worsen or fail to improve.  Marjie Skiff, NP

## 2022-05-26 NOTE — Assessment & Plan Note (Addendum)
Starting 3 days ago, 05/23/22.  Has not Covid tested at home, has been febrile.  Obtain Covid and flu testing in office today.  Scripts for Prednisone (last A1c stable and educated him to monitor sugars with this closely, as may push levels up a little) 40 MG for 5 days, Tessalon as needed, and Albuterol inhaler PRN.  If Covid or flu present will treat as indicated.  Recommend: - Increased rest - Increasing Fluids - Acetaminophen / ibuprofen as needed for fever/pain.  - Salt water gargling, chloraseptic spray and throat lozenges - Mucinex. Coricidin. - Humidifying the air.  If not feeling better by next Tuesday and if Covid negative (flu negative today) then will send in abx therapy.  He is aware to alert provider.

## 2022-05-27 LAB — NOVEL CORONAVIRUS, NAA: SARS-CoV-2, NAA: NOT DETECTED

## 2022-05-27 NOTE — Progress Notes (Signed)
Please let Emrick know his Covid testing returned negative.  If still feeling bad by Tuesday please alert me and I will send in antibiotic for him.  Thank you!!

## 2022-05-29 ENCOUNTER — Telehealth: Payer: Self-pay | Admitting: Family Medicine

## 2022-05-29 MED ORDER — AMOXICILLIN-POT CLAVULANATE 875-125 MG PO TABS: 1.0000 | ORAL_TABLET | Freq: Two times a day (BID) | ORAL | 0 refills | Status: AC

## 2022-05-29 NOTE — Telephone Encounter (Signed)
Pt was advised to call if not better by this week and Jolene would send in an antibiotic / pt called and stated he would like her to send that in and he also asked if a cough syrup can be called in as well / please call pt and advise if this will be sent to pharmacy

## 2022-05-29 NOTE — Addendum Note (Signed)
Addended by: Aura Dials T on: 05/29/2022 03:21 PM   Modules accepted: Orders

## 2022-05-29 NOTE — Telephone Encounter (Signed)
See result note for further discussing about htis

## 2022-08-01 ENCOUNTER — Ambulatory Visit: Payer: Self-pay | Admitting: *Deleted

## 2022-08-01 NOTE — Telephone Encounter (Signed)
Message from Shan Levans sent at 08/01/2022 10:20 AM EST  Summary: Cough/Congestion   Patient states that he has had cough and congestion for 4 days. Patient is requesting an antibiotic. Patient declined an appointment.          Call History   Type Contact Phone/Fax User  08/01/2022 10:18 AM EST Phone (Incoming) Obbie, [place] (Self) [phone] (M) Mabe, Sherie Don   Reason for Disposition  Cough with cold symptoms (e.g., runny nose, postnasal drip, throat clearing)  Answer Assessment - Initial Assessment Questions 1. ONSET: "When did the cough begin?"      Runny nose mostly.  Coughing up yellow mucus.    I feel great.   I have head cold.   I'm at work now.   I don't feel bad.    2. SEVERITY: "How bad is the cough today?"      I'm mostly blowing my nose a lot.   Cough is not too bad.     3. SPUTUM: "Describe the color of your sputum" (none, dry cough; clear, white, yellow, green)     Yellow This weekend I started feeling like I had another cold.   4. HEMOPTYSIS: "Are you coughing up any blood?" If so ask: "How much?" (flecks, streaks, tablespoons, etc.)     Not asked 5. DIFFICULTY BREATHING: "Are you having difficulty breathing?" If Yes, ask: "How bad is it?" (e.g., mild, moderate, severe)    - MILD: No SOB at rest, mild SOB with walking, speaks normally in sentences, can lie down, no retractions, pulse < 100.    - MODERATE: SOB at rest, SOB with minimal exertion and prefers to sit, cannot lie down flat, speaks in phrases, mild retractions, audible wheezing, pulse 100-120.    - SEVERE: Very SOB at rest, speaks in single words, struggling to breathe, sitting hunched forward, retractions, pulse > 120      I'm taking Tylenol and Mucinex that is helping.     6. FEVER: "Do you have a fever?" If Yes, ask: "What is your temperature, how was it measured, and when did it start?"     No 7. CARDIAC HISTORY: "Do you have any history of heart disease?" (e.g., heart attack, congestive  heart failure)      Not asked 8. LUNG HISTORY: "Do you have any history of lung disease?"  (e.g., pulmonary embolus, asthma, emphysema)     Not asked 9. PE RISK FACTORS: "Do you have a history of blood clots?" (or: recent major surgery, recent prolonged travel, bedridden)     Not asked 10. OTHER SYMPTOMS: "Do you have any other symptoms?" (e.g., runny nose, wheezing, chest pain)       Just a runny nose and mild cough with a little yellow mucus.   I feel fine.  I just have an old head cold.   I'm at work today and feel fine. 11. PREGNANCY: "Is there any chance you are pregnant?" "When was your last menstrual period?"       N/A 12. TRAVEL: "Have you traveled out of the country in the last month?" (e.g., travel history, exposures)       N/A The line got disconnected.   I attempted to call him back but got his voicemail.    He then called back on another line.  Protocols used: Cough - Acute Productive-A-AH

## 2022-08-01 NOTE — Telephone Encounter (Signed)
  Chief Complaint: Head congestion and a mild cough Symptoms: "Blowing my nose a lot".   Mild cough with a little yellow mucus.   "I feel fine"   "I think I just have an old head cold".   Frequency: Started about 4 days ago. Pertinent Negatives: Patient denies sore throat, fever. Disposition: [] ED /[] Urgent Care (no appt availability in office) / [] Appointment(In office/virtual)/ []  [facility]/ [x] Home Care/ [] Refused Recommended Disposition /[]  Mobile Bus/ []  Follow-up with PCP Additional Notes: I went over the Home Care advice with him.   Instructed him to call back if not better in 3-4 days or he starts getting worse, spiking a fever or coughing up green mucus.   He is taking Mucinex which he said is helping with the symptoms.   Just didn't know if he needed an antibiotic.   I let him know the reason he didn't need an antibiotic at this point due to his symptoms.   He was agreeable to calling back if he got worse.

## 2022-08-24 ENCOUNTER — Ambulatory Visit (INDEPENDENT_AMBULATORY_CARE_PROVIDER_SITE_OTHER): Payer: Medicare HMO | Admitting: Family Medicine

## 2022-08-24 ENCOUNTER — Encounter: Payer: Self-pay | Admitting: Family Medicine

## 2022-08-24 VITALS — BP 129/70 | HR 56 | Temp 98.2°F | Ht 69.0 in | Wt 189.9 lb

## 2022-08-24 DIAGNOSIS — E1169 Type 2 diabetes mellitus with other specified complication: Secondary | ICD-10-CM | POA: Diagnosis not present

## 2022-08-24 DIAGNOSIS — I152 Hypertension secondary to endocrine disorders: Secondary | ICD-10-CM | POA: Diagnosis not present

## 2022-08-24 DIAGNOSIS — D692 Other nonthrombocytopenic purpura: Secondary | ICD-10-CM | POA: Diagnosis not present

## 2022-08-24 DIAGNOSIS — E785 Hyperlipidemia, unspecified: Secondary | ICD-10-CM

## 2022-08-24 DIAGNOSIS — Z0000 Encounter for general adult medical examination without abnormal findings: Secondary | ICD-10-CM | POA: Diagnosis not present

## 2022-08-24 DIAGNOSIS — E1159 Type 2 diabetes mellitus with other circulatory complications: Secondary | ICD-10-CM | POA: Diagnosis not present

## 2022-08-24 DIAGNOSIS — Z7189 Other specified counseling: Secondary | ICD-10-CM | POA: Insufficient documentation

## 2022-08-24 DIAGNOSIS — Z1211 Encounter for screening for malignant neoplasm of colon: Secondary | ICD-10-CM

## 2022-08-24 DIAGNOSIS — N138 Other obstructive and reflux uropathy: Secondary | ICD-10-CM | POA: Diagnosis not present

## 2022-08-24 DIAGNOSIS — Z7984 Long term (current) use of oral hypoglycemic drugs: Secondary | ICD-10-CM | POA: Diagnosis not present

## 2022-08-24 DIAGNOSIS — E119 Type 2 diabetes mellitus without complications: Secondary | ICD-10-CM

## 2022-08-24 DIAGNOSIS — Z136 Encounter for screening for cardiovascular disorders: Secondary | ICD-10-CM

## 2022-08-24 DIAGNOSIS — N401 Enlarged prostate with lower urinary tract symptoms: Secondary | ICD-10-CM

## 2022-08-24 MED ORDER — ATORVASTATIN CALCIUM 40 MG PO TABS: 40.0000 mg | ORAL_TABLET | Freq: Every day | ORAL | 1 refills | Status: DC

## 2022-08-24 MED ORDER — ALBUTEROL SULFATE HFA 108 (90 BASE) MCG/ACT IN AERS: 2.0000 | INHALATION_SPRAY | Freq: Four times a day (QID) | RESPIRATORY_TRACT | 0 refills | Status: DC | PRN

## 2022-08-24 MED ORDER — METFORMIN HCL ER 750 MG PO TB24: 750.0000 mg | ORAL_TABLET | Freq: Two times a day (BID) | ORAL | 1 refills | Status: DC

## 2022-08-24 MED ORDER — FINASTERIDE 5 MG PO TABS: 5.0000 mg | ORAL_TABLET | Freq: Every day | ORAL | 1 refills | Status: DC

## 2022-08-24 MED ORDER — BENAZEPRIL HCL 40 MG PO TABS: 40.0000 mg | ORAL_TABLET | Freq: Every day | ORAL | 1 refills | Status: DC

## 2022-08-24 MED ORDER — TAMSULOSIN HCL 0.4 MG PO CAPS: 0.4000 mg | ORAL_CAPSULE | Freq: Every day | ORAL | 1 refills | Status: DC

## 2022-08-24 NOTE — Patient Instructions (Signed)
Preventative Services:  Screening for AAA: N/A Health Risk Assessment and Personalized Prevention Plan: Done today Bone Mass Measurements: N/A CVD Screening: Done today Colon Cancer Screening: will do cologuard Depression Screening: Done today Diabetes Screening: Done today Glaucoma Screening: See your eye doctor Hepatitis B vaccine: N/A Hepatitis C screening: up to date HIV Screening: Up to date Flu Vaccine:up to date Lung cancer Screening: N/A Obesity Screening: Done today Pneumonia Vaccines (2): up to date STI Screening: N/A PSA screening: done today

## 2022-08-24 NOTE — Assessment & Plan Note (Signed)
Under good control on current regimen. Continue current regimen. Continue to monitor. Call with any concerns. Refills given. Labs drawn today.   

## 2022-08-24 NOTE — Progress Notes (Signed)
Interpreted by me today. Sinus bradycardia at 57bpm, no ST segment changes

## 2022-08-24 NOTE — Assessment & Plan Note (Signed)
A voluntary discussion about advance care planning including the explanation and discussion of advance directives was extensively discussed  with the patient for 3 minutes with patient present.  Explanation about the health care proxy and Living will was reviewed and packet with forms with explanation of how to fill them out was given.  During this discussion, the patient was not able to identify a health care proxy and plans to fill out the paperwork required.  Patient was offered a separate [place] visit for further assistance with forms.

## 2022-08-24 NOTE — Progress Notes (Signed)
BP 129/70   Pulse (!) 56   Temp 98.2 F (36.8 C) (Oral)   Ht 5' 9"$  (1.753 m)   Wt 189 lb 14.4 oz (86.1 kg)   SpO2 99%   BMI 28.04 kg/m    Subjective:    Patient ID: Scott Hester, male    DOB: Dec 06, 1955, 67 y.o.   MRN: [id number]  HPI: Scott Hester is a 67 y.o. male presenting on 08/24/2022 for comprehensive medical examination. Current medical complaints include:  DIABETES Hypoglycemic episodes:no Polydipsia/polyuria: no Visual disturbance: no Chest pain: no Paresthesias: no Glucose Monitoring: no  Accucheck frequency: Not Checking Taking Insulin?: no Blood Pressure Monitoring: not checking Retinal Examination: Up to Date Foot Exam: Up to Date Diabetic Education: Completed Pneumovax: Up to Date Influenza: Up to Date Aspirin: no  HYPERTENSION / [place] Satisfied with current treatment? yes Duration of hypertension: chronic BP monitoring frequency: not checking BP medication side effects: no Past BP meds: benazepril Duration of hyperlipidemia: chronic Cholesterol medication side effects: no Cholesterol supplements: none Past cholesterol medications: atorvastatin Medication compliance: excellent compliance Aspirin: no Recent stressors: no Recurrent headaches: no Visual changes: no Palpitations: no Dyspnea: no Chest pain: no Lower extremity edema: no Dizzy/lightheaded: no  Interim Problems from his last visit: no  Functional Status Survey: Is the patient deaf or have difficulty hearing?: No Does the patient have difficulty seeing, even when wearing glasses/contacts?: No Does the patient have difficulty concentrating, remembering, or making decisions?: No Does the patient have difficulty walking or climbing stairs?: No Does the patient have difficulty dressing or bathing?: No Does the patient have difficulty doing errands alone such as visiting a doctor's office or shopping?: No  FALL RISK:    08/24/2022    9:11 AM 04/25/2022    8:21 AM  03/28/2022    9:18 AM 02/21/2022    9:19 AM 11/21/2021    9:28 AM  [place] in the past year? 0 0 0 0 0  Number falls in past yr: 0 0 0 0 0  Injury with Fall? 0 0 0 0 0  Risk for fall due to : No Fall Risks No Fall Risks No Fall Risks No Fall Risks No Fall Risks  Follow up Falls evaluation completed Falls evaluation completed Falls evaluation completed Falls evaluation completed Falls evaluation completed    Depression Screen    08/24/2022    9:11 AM 04/25/2022    8:21 AM 03/28/2022    9:17 AM 02/21/2022    9:18 AM 11/21/2021    9:28 AM  Depression screen PHQ 2/9  Decreased Interest 0 0 0 0 0  Down, Depressed, Hopeless 0 0 0 0 0  PHQ - 2 Score 0 0 0 0 0  Altered sleeping 0 0 0 0 0  Tired, decreased energy 0 0 0 0 0  Change in appetite 0 0 0 0 0  Feeling bad or failure about yourself  0 0 0 0 0  Trouble concentrating 0 0 0 0 0  Moving slowly or fidgety/restless 0 0 0 0 0  Suicidal thoughts 0 0 0 0 0  PHQ-9 Score 0 0 0 0 0  Difficult doing work/chores Not difficult at all Not difficult at all Not difficult at all Not difficult at all Not difficult at all    Advanced Directives Does patient have a HCPOA?    no Does patient have a living will or MOST form?  no  Past Medical History:  Past Medical  History:  Diagnosis Date   BPH (benign prostatic hyperplasia)    Hypertension     Surgical History:  History reviewed. No pertinent surgical history.  Medications:  No current outpatient medications on file prior to visit.   No current facility-administered medications on file prior to visit.    Allergies:  No Known Allergies  Social History:  Social History   Socioeconomic History   Marital status: Single    Spouse name: Not on file   Number of children: Not on file   Years of education: Not on file   Highest education level: Not on file  Occupational History   Not on file  Tobacco Use   Smoking status: Never   Smokeless tobacco: Never  Vaping Use    Vaping Use: Never used  Substance and Sexual Activity   Alcohol use: No   Drug use: No   Sexual activity: Yes  Other Topics Concern   Not on file  Social History Narrative   Not on file   Social Determinants of Health   Financial Resource Strain: Not on file  Food Insecurity: Not on file  Transportation Needs: Not on file  Physical Activity: Sufficiently Active (11/14/2018)   Exercise Vital Sign    Days of Exercise per Week: 7 days    Minutes of Exercise per Session: 30 min  Stress: No Stress Concern Present (11/14/2018)   [place]    Feeling of Stress : Not at all  Social Connections: Unknown (11/14/2018)   Social Connection and Isolation Panel [NHANES]    Frequency of Communication with Friends and Family: More than three times a week    Frequency of Social Gatherings with Friends and Family: Patient refused    Attends Religious Services: Patient refused    Marine scientist or Organizations: No    Attends Archivist Meetings: Never    Marital Status: Married  Human resources officer Violence: Not At Risk (11/14/2018)   Humiliation, Afraid, Rape, and Kick questionnaire    Fear of Current or Ex-Partner: No    Emotionally Abused: No    Physically Abused: No    Sexually Abused: No   Social History   Tobacco Use  Smoking Status Never  Smokeless Tobacco Never   Social History   Substance and Sexual Activity  Alcohol Use No    Family History:  Family History  Problem Relation Age of Onset   Hypertension Mother    Hypertension Father    Diabetes Father    Hypertension Maternal Grandmother    Diabetes Maternal Grandmother     Past medical history, surgical history, medications, allergies, family history and social history reviewed with patient today and changes made to appropriate areas of the chart.   Review of Systems  Constitutional: Negative.   HENT:  Positive for ear pain. Negative for  congestion, ear discharge, hearing loss, nosebleeds, sinus pain, sore throat and tinnitus.   Eyes: Negative.   Respiratory: Negative.  Negative for stridor.   Cardiovascular: Negative.   Gastrointestinal: Negative.   Genitourinary: Negative.   Musculoskeletal: Negative.   Skin: Negative.   Neurological: Negative.   Endo/Heme/Allergies:  Negative for environmental allergies and polydipsia. Bruises/bleeds easily.  Psychiatric/Behavioral: Negative.     All other ROS negative except what is listed above and in the HPI.      Objective:    BP 129/70   Pulse (!) 56   Temp 98.2 F (36.8 C) (Oral)  Ht 5' 9"$  (1.753 m)   Wt 189 lb 14.4 oz (86.1 kg)   SpO2 99%   BMI 28.04 kg/m   Wt Readings from Last 3 Encounters:  08/24/22 189 lb 14.4 oz (86.1 kg)  05/26/22 191 lb 9.6 oz (86.9 kg)  04/25/22 188 lb 12.8 oz (85.6 kg)    Vision Screening   Right eye Left eye Both eyes  Without correction 20/30 20/25 20/25 $  With correction       Physical Exam Vitals and nursing note reviewed.  Constitutional:      General: He is not in acute distress.    Appearance: Normal appearance. He is obese. He is not ill-appearing, toxic-appearing or diaphoretic.  HENT:     Head: Normocephalic and atraumatic.     Right Ear: Tympanic membrane, ear canal and external ear normal. There is no impacted cerumen.     Left Ear: Tympanic membrane, ear canal and external ear normal. There is no impacted cerumen.     Nose: Nose normal. No congestion or rhinorrhea.     Mouth/Throat:     Mouth: Mucous membranes are moist.     Pharynx: Oropharynx is clear. No oropharyngeal exudate or posterior oropharyngeal erythema.  Eyes:     General: No scleral icterus.       Right eye: No discharge.        Left eye: No discharge.     Extraocular Movements: Extraocular movements intact.     Conjunctiva/sclera: Conjunctivae normal.     Pupils: Pupils are equal, round, and reactive to light.  Neck:     Vascular: No carotid  bruit.  Cardiovascular:     Rate and Rhythm: Normal rate and regular rhythm.     Pulses: Normal pulses.     Heart sounds: No murmur heard.    No friction rub. No gallop.  Pulmonary:     Effort: Pulmonary effort is normal. No respiratory distress.     Breath sounds: Normal breath sounds. No stridor. No wheezing, rhonchi or rales.  Chest:     Chest wall: No tenderness.  Abdominal:     General: Abdomen is flat. Bowel sounds are normal. There is no distension.     Palpations: Abdomen is soft. There is no mass.     Tenderness: There is no abdominal tenderness. There is no right CVA tenderness, left CVA tenderness, guarding or rebound.     Hernia: No hernia is present.  Genitourinary:    Comments: Genital exam deferred with shared decision making Musculoskeletal:        General: No swelling, tenderness, deformity or signs of injury.     Cervical back: Normal range of motion and neck supple. No rigidity. No muscular tenderness.     Right lower leg: No edema.     Left lower leg: No edema.  Lymphadenopathy:     Cervical: No cervical adenopathy.  Skin:    General: Skin is warm and dry.     Capillary Refill: Capillary refill takes less than 2 seconds.     Coloration: Skin is not jaundiced or pale.     Findings: No bruising, erythema, lesion or rash.  Neurological:     General: No focal deficit present.     Mental Status: He is alert and oriented to person, place, and time.     Cranial Nerves: No cranial nerve deficit.     Sensory: No sensory deficit.     Motor: No weakness.     Coordination: Coordination normal.  Gait: Gait normal.     Deep Tendon Reflexes: Reflexes normal.  Psychiatric:        Mood and Affect: Mood normal.        Behavior: Behavior normal.        Thought Content: Thought content normal.        Judgment: Judgment normal.        08/24/2022    9:52 AM  6CIT Screen  What Year? 0 points  What month? 0 points  What time? 0 points  Count back from 20 0 points   Months in reverse 0 points  Repeat phrase 0 points  Total Score 0 points    Results for orders placed or performed in visit on 05/26/22  Novel Coronavirus, NAA ([facility])   Specimen: Saline  Result Value Ref Range   SARS-CoV-2, NAA Not Detected Not Detected  Veritor Flu A/B Waived  Result Value Ref Range   Influenza A Negative Negative   Influenza B Negative Negative      Assessment & Plan:   Problem List Items Addressed This Visit       Cardiovascular and Mediastinum   Hypertension associated with diabetes ([place])    Under good control on current regimen. Continue current regimen. Continue to monitor. Call with any concerns. Refills given. Labs drawn today.       Relevant Medications   atorvastatin (LIPITOR) 40 MG tablet   benazepril (LOTENSIN) 40 MG tablet   metFORMIN (GLUCOPHAGE-XR) 750 MG 24 hr tablet   Other Relevant Orders   Comprehensive metabolic panel   CBC with Differential/Platelet   TSH   Urinalysis, Routine w reflex microscopic   Senile purpura ([place])    Reassured patient. Continue to monitor.       Relevant Medications   atorvastatin (LIPITOR) 40 MG tablet   benazepril (LOTENSIN) 40 MG tablet     Endocrine   Controlled type 2 diabetes mellitus without complication, without long-term current use of insulin ([place])    Rechecking labs today. Await results. Treat as needed.       Relevant Medications   atorvastatin (LIPITOR) 40 MG tablet   benazepril (LOTENSIN) 40 MG tablet   metFORMIN (GLUCOPHAGE-XR) 750 MG 24 hr tablet   Other Relevant Orders   Comprehensive metabolic panel   CBC with Differential/Platelet   Urinalysis, Routine w reflex microscopic   Urine Microalbumin w/creat. ratio   Hgb A1c w/o eAG   Hyperlipidemia associated with type 2 diabetes mellitus ([place])    Under good control on current regimen. Continue current regimen. Continue to monitor. Call with any concerns. Refills given. Labs drawn today.      Relevant Medications    atorvastatin (LIPITOR) 40 MG tablet   benazepril (LOTENSIN) 40 MG tablet   metFORMIN (GLUCOPHAGE-XR) 750 MG 24 hr tablet   Other Relevant Orders   Comprehensive metabolic panel   CBC with Differential/Platelet   Lipid Panel w/o Chol/HDL Ratio     Genitourinary   BPH with obstruction/lower urinary tract symptoms    Under good control on current regimen. Continue current regimen. Continue to monitor. Call with any concerns. Refills given. Labs drawn today.      Relevant Medications   finasteride (PROSCAR) 5 MG tablet   tamsulosin (FLOMAX) 0.4 MG CAPS capsule   Other Relevant Orders   Comprehensive metabolic panel   CBC with Differential/Platelet   PSA     Other   Advance care planning    A voluntary discussion about advance care planning including the  explanation and discussion of advance directives was extensively discussed  with the patient for 3 minutes with patient present.  Explanation about the health care proxy and Living will was reviewed and packet with forms with explanation of how to fill them out was given.  During this discussion, the patient was not able to identify a health care proxy and plans to fill out the paperwork required.  Patient was offered a separate [place] visit for further assistance with forms.         Other Visit Diagnoses     Welcome to Medicare preventive visit    -  Primary   Preventative care discussed today as below   Relevant Orders   EKG 12-Lead (Completed)   Screening for cardiovascular condition       EKG normal except for bradycardia. Continue to monitor.   Relevant Orders   EKG 12-Lead (Completed)   Screening for colon cancer       Cologuard ordered today.   Relevant Orders   Cologuard        Preventative Services:  Screening for AAA: N/A Health Risk Assessment and Personalized Prevention Plan: Done today Bone Mass Measurements: N/A CVD Screening: Done today Colon Cancer Screening: will do cologuard Depression  Screening: Done today Diabetes Screening: Done today Glaucoma Screening: See your eye doctor Hepatitis B vaccine: N/A Hepatitis C screening: up to date HIV Screening: Up to date Flu Vaccine:up to date Lung cancer Screening: N/A Obesity Screening: Done today Pneumonia Vaccines (2): up to date STI Screening: N/A PSA screening: done today  Discussed aspirin prophylaxis for myocardial infarction prevention and decision was it was not indicated  LABORATORY TESTING:  Health maintenance labs ordered today as discussed above.   The natural history of prostate cancer and ongoing controversy regarding screening and potential treatment outcomes of prostate cancer has been discussed with the patient. The meaning of a false positive PSA and a false negative PSA has been discussed. He indicates understanding of the limitations of this screening test and wishes to proceed with screening PSA testing.   IMMUNIZATIONS:   - Tdap: Tetanus vaccination status reviewed: last tetanus booster within 10 years. - Influenza: Up to date - Pneumovax: Up to date - Prevnar: Up to date - Zostavax vaccine: Up to date  SCREENING: - Colonoscopy: Ordered today  Discussed with patient purpose of the colonoscopy is to detect colon cancer at curable precancerous or early stages   - AAA Screening: Not applicable  -Hearing Test: Ordered today   PATIENT COUNSELING:    Sexuality: Discussed sexually transmitted diseases, partner selection, use of condoms, avoidance of unintended pregnancy  and contraceptive alternatives.   Advised to avoid cigarette smoking.  I discussed with the patient that most people either abstain from alcohol or drink within safe limits (<=14/week and <=4 drinks/occasion for males, <=7/weeks and <= 3 drinks/occasion for females) and that the risk for alcohol disorders and other health effects rises proportionally with the number of drinks per week and how often a drinker exceeds daily  limits.  Discussed cessation/primary prevention of drug use and availability of treatment for abuse.   Diet: Encouraged to adjust caloric intake to maintain  or achieve ideal body weight, to reduce intake of dietary saturated fat and total fat, to limit sodium intake by avoiding high sodium foods and not adding table salt, and to maintain adequate dietary potassium and calcium preferably from fresh fruits, vegetables, and low-fat dairy products.    stressed the importance of regular  exercise  Injury prevention: Discussed safety belts, safety helmets, smoke detector, smoking near bedding or upholstery.   Dental health: Discussed importance of regular tooth brushing, flossing, and dental visits.   Follow up plan: NEXT PREVENTATIVE PHYSICAL DUE IN 1 YEAR. Return in about 6 months (around 02/22/2023).

## 2022-08-24 NOTE — Assessment & Plan Note (Signed)
Reassured patient. Continue to monitor.  

## 2022-08-24 NOTE — Assessment & Plan Note (Signed)
Rechecking labs today. Await results. Treat as needed.  °

## 2022-08-25 LAB — MICROALBUMIN / CREATININE URINE RATIO
Creatinine, Urine: 93.2 mg/dL
Microalb/Creat Ratio: 10 mg/g creat (ref 0–29)
Microalbumin, Urine: 9 ug/mL

## 2022-08-25 LAB — CBC WITH DIFFERENTIAL/PLATELET
Basophils Absolute: 0 10*3/uL (ref 0.0–0.2)
Basos: 1 %
EOS (ABSOLUTE): 0.3 10*3/uL (ref 0.0–0.4)
Eos: 5 %
Hematocrit: 37.7 % (ref 37.5–51.0)
Hemoglobin: 13.2 g/dL (ref 13.0–17.7)
Immature Grans (Abs): 0 10*3/uL (ref 0.0–0.1)
Immature Granulocytes: 0 %
Lymphocytes Absolute: 1.5 10*3/uL (ref 0.7–3.1)
Lymphs: 29 %
MCH: 30.6 pg (ref 26.6–33.0)
MCHC: 35 g/dL (ref 31.5–35.7)
MCV: 87 fL (ref 79–97)
Monocytes Absolute: 0.4 10*3/uL (ref 0.1–0.9)
Monocytes: 8 %
Neutrophils Absolute: 3.1 10*3/uL (ref 1.4–7.0)
Neutrophils: 57 %
Platelets: 175 10*3/uL (ref 150–450)
RBC: 4.32 x10E6/uL (ref 4.14–5.80)
RDW: 12.1 % (ref 11.6–15.4)
WBC: 5.4 10*3/uL (ref 3.4–10.8)

## 2022-08-25 LAB — URINALYSIS, ROUTINE W REFLEX MICROSCOPIC
Bilirubin, UA: NEGATIVE
Glucose, UA: NEGATIVE
Ketones, UA: NEGATIVE
Leukocytes,UA: NEGATIVE
Nitrite, UA: NEGATIVE
Protein,UA: NEGATIVE
RBC, UA: NEGATIVE
Specific Gravity, UA: 1.017 (ref 1.005–1.030)
Urobilinogen, Ur: 0.2 mg/dL (ref 0.2–1.0)
pH, UA: 7 (ref 5.0–7.5)

## 2022-08-25 LAB — COMPREHENSIVE METABOLIC PANEL
ALT: 24 IU/L (ref 0–44)
AST: 17 IU/L (ref 0–40)
Albumin/Globulin Ratio: 2.1 (ref 1.2–2.2)
Albumin: 4.4 g/dL (ref 3.9–4.9)
Alkaline Phosphatase: 53 IU/L (ref 44–121)
BUN/Creatinine Ratio: 16 (ref 10–24)
BUN: 12 mg/dL (ref 8–27)
Bilirubin Total: 1.2 mg/dL (ref 0.0–1.2)
CO2: 23 mmol/L (ref 20–29)
Calcium: 9.6 mg/dL (ref 8.6–10.2)
Chloride: 102 mmol/L (ref 96–106)
Creatinine, Ser: 0.73 mg/dL — ABNORMAL LOW (ref 0.76–1.27)
Globulin, Total: 2.1 g/dL (ref 1.5–4.5)
Glucose: 145 mg/dL — ABNORMAL HIGH (ref 70–99)
Potassium: 4 mmol/L (ref 3.5–5.2)
Sodium: 140 mmol/L (ref 134–144)
Total Protein: 6.5 g/dL (ref 6.0–8.5)
eGFR: 101 mL/min/{1.73_m2} (ref >59)

## 2022-08-25 LAB — LIPID PANEL W/O CHOL/HDL RATIO
Cholesterol, Total: 126 mg/dL (ref 100–199)
HDL: 48 mg/dL (ref >39)
LDL Chol Calc (NIH): 66 mg/dL (ref 0–99)
Triglycerides: 53 mg/dL (ref 0–149)
VLDL Cholesterol Cal: 12 mg/dL (ref 5–40)

## 2022-08-25 LAB — PSA: Prostate Specific Ag, Serum: 0.4 ng/mL (ref 0.0–4.0)

## 2022-08-25 LAB — TSH: TSH: 1.07 u[IU]/mL (ref 0.450–4.500)

## 2022-08-25 LAB — HGB A1C W/O EAG: Hgb A1c MFr Bld: 6.9 % — ABNORMAL HIGH (ref 4.8–5.6)

## 2022-08-28 ENCOUNTER — Ambulatory Visit: Payer: Medicare HMO | Admitting: Family Medicine

## 2022-09-04 ENCOUNTER — Encounter: Payer: Self-pay | Admitting: Family Medicine

## 2023-02-14 ENCOUNTER — Other Ambulatory Visit: Payer: Self-pay | Admitting: Family Medicine

## 2023-02-14 DIAGNOSIS — Z1211 Encounter for screening for malignant neoplasm of colon: Secondary | ICD-10-CM

## 2023-02-17 ENCOUNTER — Other Ambulatory Visit: Payer: Self-pay | Admitting: Family Medicine

## 2023-02-19 NOTE — Telephone Encounter (Signed)
Requested Prescriptions  Pending Prescriptions Disp Refills   metFORMIN (GLUCOPHAGE-XR) 750 MG 24 hr tablet [Pharmacy Med Name: METFORMIN HCL ER 750MG  TB24] 180 tablet 0    Sig: TAKE ONE TABLET BY MOUTH TWICE A DAY     Endocrinology:  Diabetes - Biguanides Failed - 02/17/2023 10:38 AM      Failed - Cr in normal range and within 360 days    Creatinine, Ser  Date Value Ref Range Status  08/24/2022 0.73 (L) 0.76 - 1.27 mg/dL Final         Failed - B12 Level in normal range and within 720 days    No results found for: "VITAMINB12"       Passed - HBA1C is between 0 and 7.9 and within 180 days    HB A1C (BAYER DCA - WAIVED)  Date Value Ref Range Status  02/21/2022 6.5 (H) 4.8 - 5.6 % Final    Comment:             Prediabetes: 5.7 - 6.4          Diabetes: >6.4          Glycemic control for adults with diabetes: <7.0    Hgb A1c MFr Bld  Date Value Ref Range Status  08/24/2022 6.9 (H) 4.8 - 5.6 % Final    Comment:             Prediabetes: 5.7 - 6.4          Diabetes: >6.4          Glycemic control for adults with diabetes: <7.0          Passed - eGFR in normal range and within 360 days    GFR calc Af Amer  Date Value Ref Range Status  07/15/2020 115 >59 mL/min/1.73 Final    Comment:    **In accordance with recommendations from the NKF-ASN Task force,**   [facility] is in the process of updating its eGFR calculation to the   2021 CKD-EPI creatinine equation that estimates kidney function   without a race variable.    GFR, Estimated  Date Value Ref Range Status  06/24/2020 >60 >60 mL/min Final    Comment:    (NOTE) Calculated using the CKD-EPI Creatinine Equation (2021)    GFR calc non Af Amer  Date Value Ref Range Status  07/15/2020 99 >59 mL/min/1.73 Final   eGFR  Date Value Ref Range Status  08/24/2022 101 >59 mL/min/1.73 Final         Passed - Valid encounter within last 6 months    Recent Outpatient Visits           5 months ago Welcome to [employer] preventive  visit   [place] [facility] [place], Megan P, DO   8 months ago Acute cough   [place] [facility] [place], Jakin T, NP   10 months ago Essential hypertension   [place] [facility] [place], Megan P, DO   10 months ago Essential hypertension   [place] [facility] [place], Megan P, DO   12 months ago Controlled type 2 diabetes mellitus without complication, without long-term current use of insulin (HCC)   [place] [facility] [place], Oralia Rud, DO       Future Appointments             In 3 days [place], Oralia Rud, DO [place] [facility]  Passed - CBC within normal limits and completed in the last 12 months    WBC  Date Value Ref Range Status  08/24/2022 5.4 3.4 - 10.8 x10E3/uL Final  11/17/2018 7.7 4.0 - 10.5 K/uL Final   RBC  Date Value Ref Range Status  08/24/2022 4.32 4.14 - 5.80 x10E6/uL Final  11/17/2018 4.03 (L) 4.22 - 5.81 MIL/uL Final   Hemoglobin  Date Value Ref Range Status  08/24/2022 13.2 13.0 - 17.7 g/dL Final   Hematocrit  Date Value Ref Range Status  08/24/2022 37.7 37.5 - 51.0 % Final   MCHC  Date Value Ref Range Status  08/24/2022 35.0 31.5 - 35.7 g/dL Final  56/38/7564 33.2 30.0 - 36.0 g/dL Final   [facility]  Date Value Ref Range Status  08/24/2022 30.6 26.6 - 33.0 pg Final  11/17/2018 29.5 26.0 - 34.0 pg Final   MCV  Date Value Ref Range Status  08/24/2022 87 79 - 97 fL Final   No results found for: "PLTCOUNTKUC", "LABPLAT", "POCPLA" RDW  Date Value Ref Range Status  08/24/2022 12.1 11.6 - 15.4 % Final

## 2023-02-22 ENCOUNTER — Ambulatory Visit (INDEPENDENT_AMBULATORY_CARE_PROVIDER_SITE_OTHER): Payer: Medicare HMO | Admitting: Family Medicine

## 2023-02-22 ENCOUNTER — Encounter: Payer: Self-pay | Admitting: Family Medicine

## 2023-02-22 VITALS — BP 120/70 | HR 59 | Temp 97.9°F | Wt 185.6 lb

## 2023-02-22 DIAGNOSIS — I152 Hypertension secondary to endocrine disorders: Secondary | ICD-10-CM

## 2023-02-22 DIAGNOSIS — N138 Other obstructive and reflux uropathy: Secondary | ICD-10-CM

## 2023-02-22 DIAGNOSIS — E1169 Type 2 diabetes mellitus with other specified complication: Secondary | ICD-10-CM | POA: Diagnosis not present

## 2023-02-22 DIAGNOSIS — E1159 Type 2 diabetes mellitus with other circulatory complications: Secondary | ICD-10-CM | POA: Diagnosis not present

## 2023-02-22 DIAGNOSIS — Z1211 Encounter for screening for malignant neoplasm of colon: Secondary | ICD-10-CM

## 2023-02-22 DIAGNOSIS — N401 Enlarged prostate with lower urinary tract symptoms: Secondary | ICD-10-CM

## 2023-02-22 DIAGNOSIS — Z7984 Long term (current) use of oral hypoglycemic drugs: Secondary | ICD-10-CM | POA: Diagnosis not present

## 2023-02-22 DIAGNOSIS — E119 Type 2 diabetes mellitus without complications: Secondary | ICD-10-CM

## 2023-02-22 DIAGNOSIS — E785 Hyperlipidemia, unspecified: Secondary | ICD-10-CM

## 2023-02-22 LAB — BAYER DCA HB A1C WAIVED: HB A1C (BAYER DCA - WAIVED): 6.8 % — ABNORMAL HIGH (ref 4.8–5.6)

## 2023-02-22 MED ORDER — ALBUTEROL SULFATE HFA 108 (90 BASE) MCG/ACT IN AERS: 2.0000 | INHALATION_SPRAY | Freq: Four times a day (QID) | RESPIRATORY_TRACT | 0 refills | Status: DC | PRN

## 2023-02-22 MED ORDER — BENAZEPRIL HCL 40 MG PO TABS
40.0000 mg | ORAL_TABLET | Freq: Every day | ORAL | 1 refills | Status: DC
Start: 2023-02-22 — End: 2023-10-09

## 2023-02-22 MED ORDER — TAMSULOSIN HCL 0.4 MG PO CAPS: 0.4000 mg | ORAL_CAPSULE | Freq: Every day | ORAL | 1 refills | Status: DC

## 2023-02-22 MED ORDER — FINASTERIDE 5 MG PO TABS
5.0000 mg | ORAL_TABLET | Freq: Every day | ORAL | 1 refills | Status: DC
Start: 2023-02-22 — End: 2023-10-15

## 2023-02-22 MED ORDER — METFORMIN HCL ER 750 MG PO TB24: 750.0000 mg | ORAL_TABLET | Freq: Two times a day (BID) | ORAL | 1 refills | Status: DC

## 2023-02-22 MED ORDER — ATORVASTATIN CALCIUM 40 MG PO TABS: 40.0000 mg | ORAL_TABLET | Freq: Every day | ORAL | 1 refills | Status: DC

## 2023-02-22 NOTE — Assessment & Plan Note (Signed)
Rechecking labs today. Await results. Call with any concerns.  

## 2023-02-22 NOTE — Assessment & Plan Note (Signed)
Under good control on current regimen. Continue current regimen. Continue to monitor. Call with any concerns. Refills given. Labs drawn today.   

## 2023-02-22 NOTE — Progress Notes (Signed)
BP 120/70   Pulse (!) 59   Temp 97.9 F (36.6 C) (Oral)   Wt 185 lb 9.6 oz (84.2 kg)   SpO2 99%   BMI 27.41 kg/m    Subjective:    Patient ID: Scott Hester, male    DOB: 08/02/1955, 67 y.o.   MRN: [id number]  HPI: Scott Hester is a 67 y.o. male  Chief Complaint  Patient presents with   Hypertension   Diabetes    Appointment is scheduled for the end of this month   DIABETES Hypoglycemic episodes:no Polydipsia/polyuria: no Visual disturbance: no Chest pain: no Paresthesias: no Glucose Monitoring: yes Taking Insulin?: no Blood Pressure Monitoring: not checking Retinal Examination: End of the Month- Dr. Janett Billow Exam: Up to Date Diabetic Education: Completed Pneumovax: Up to Date Influenza:  Will get next time Aspirin: no  HYPERTENSION / HYPERLIPIDEMIA Satisfied with current treatment? yes Duration of hypertension: chronic BP monitoring frequency: not checking BP medication side effects: no Past BP meds: benazepril Duration of hyperlipidemia: chronic Cholesterol medication side effects: no Cholesterol supplements: none Past cholesterol medications: atorvastatin Medication compliance: excellent compliance Aspirin: no Recent stressors: no Recurrent headaches: no Visual changes: no Palpitations: no Dyspnea: no Chest pain: no Lower extremity edema: no Dizzy/lightheaded: no  Relevant past medical, surgical, family and social history reviewed and updated as indicated. Interim medical history since our last visit reviewed. Allergies and medications reviewed and updated.  Review of Systems  Constitutional: Negative.   Respiratory: Negative.    Cardiovascular: Negative.   Gastrointestinal: Negative.   Musculoskeletal: Negative.   Neurological: Negative.   Psychiatric/Behavioral: Negative.      Per HPI unless specifically indicated above     Objective:    BP 120/70   Pulse (!) 59   Temp 97.9 F (36.6 C) (Oral)   Wt 185 lb 9.6 oz (84.2 kg)    SpO2 99%   BMI 27.41 kg/m   Wt Readings from Last 3 Encounters:  02/22/23 185 lb 9.6 oz (84.2 kg)  08/24/22 189 lb 14.4 oz (86.1 kg)  05/26/22 191 lb 9.6 oz (86.9 kg)    Physical Exam Vitals and nursing note reviewed.  Constitutional:      General: He is not in acute distress.    Appearance: Normal appearance. He is not ill-appearing, toxic-appearing or diaphoretic.  HENT:     Head: Normocephalic and atraumatic.     Right Ear: External ear normal.     Left Ear: External ear normal.     Nose: Nose normal.     Mouth/Throat:     Mouth: Mucous membranes are moist.     Pharynx: Oropharynx is clear.  Eyes:     General: No scleral icterus.       Right eye: No discharge.        Left eye: No discharge.     Extraocular Movements: Extraocular movements intact.     Conjunctiva/sclera: Conjunctivae normal.     Pupils: Pupils are equal, round, and reactive to light.  Cardiovascular:     Rate and Rhythm: Normal rate and regular rhythm.     Pulses: Normal pulses.     Heart sounds: Normal heart sounds. No murmur heard.    No friction rub. No gallop.  Pulmonary:     Effort: Pulmonary effort is normal. No respiratory distress.     Breath sounds: Normal breath sounds. No stridor. No wheezing, rhonchi or rales.  Chest:     Chest wall: No tenderness.  Musculoskeletal:  General: Normal range of motion.     Cervical back: Normal range of motion and neck supple.  Skin:    General: Skin is warm and dry.     Capillary Refill: Capillary refill takes less than 2 seconds.     Coloration: Skin is not jaundiced or pale.     Findings: No bruising, erythema, lesion or rash.  Neurological:     General: No focal deficit present.     Mental Status: He is alert and oriented to person, place, and time. Mental status is at baseline.  Psychiatric:        Mood and Affect: Mood normal.        Behavior: Behavior normal.        Thought Content: Thought content normal.        Judgment: Judgment  normal.     Results for orders placed or performed in visit on 08/24/22  Comprehensive metabolic panel  Result Value Ref Range   Glucose 145 (H) 70 - 99 mg/dL   BUN 12 8 - 27 mg/dL   Creatinine, Ser 1.61 (L) 0.76 - 1.27 mg/dL   eGFR 096 >04 VW/UJW/1.19   BUN/Creatinine Ratio 16 10 - 24   Sodium 140 134 - 144 mmol/L   Potassium 4.0 3.5 - 5.2 mmol/L   Chloride 102 96 - 106 mmol/L   CO2 23 20 - 29 mmol/L   Calcium 9.6 8.6 - 10.2 mg/dL   Total Protein 6.5 6.0 - 8.5 g/dL   Albumin 4.4 3.9 - 4.9 g/dL   Globulin, Total 2.1 1.5 - 4.5 g/dL   Albumin/Globulin Ratio 2.1 1.2 - 2.2   Bilirubin Total 1.2 0.0 - 1.2 mg/dL   Alkaline Phosphatase 53 44 - 121 IU/L   AST 17 0 - 40 IU/L   ALT 24 0 - 44 IU/L  CBC with Differential/Platelet  Result Value Ref Range   WBC 5.4 3.4 - 10.8 x10E3/uL   RBC 4.32 4.14 - 5.80 x10E6/uL   Hemoglobin 13.2 13.0 - 17.7 g/dL   Hematocrit 14.7 82.9 - 51.0 %   MCV 87 79 - 97 fL   MCH 30.6 26.6 - 33.0 pg   MCHC 35.0 31.5 - 35.7 g/dL   RDW 56.2 13.0 - 86.5 %   Platelets 175 150 - 450 x10E3/uL   Neutrophils 57 Not Estab. %   Lymphs 29 Not Estab. %   Monocytes 8 Not Estab. %   Eos 5 Not Estab. %   Basos 1 Not Estab. %   Neutrophils Absolute 3.1 1.4 - 7.0 x10E3/uL   Lymphocytes Absolute 1.5 0.7 - 3.1 x10E3/uL   Monocytes Absolute 0.4 0.1 - 0.9 x10E3/uL   EOS (ABSOLUTE) 0.3 0.0 - 0.4 x10E3/uL   Basophils Absolute 0.0 0.0 - 0.2 x10E3/uL   Immature Granulocytes 0 Not Estab. %   Immature Grans (Abs) 0.0 0.0 - 0.1 x10E3/uL  Lipid Panel w/o Chol/HDL Ratio  Result Value Ref Range   Cholesterol, Total 126 100 - 199 mg/dL   Triglycerides 53 0 - 149 mg/dL   HDL 48 >78 mg/dL   VLDL Cholesterol Cal 12 5 - 40 mg/dL   LDL Chol Calc (NIH) 66 0 - 99 mg/dL  PSA  Result Value Ref Range   Prostate Specific Ag, Serum 0.4 0.0 - 4.0 ng/mL  TSH  Result Value Ref Range   TSH 1.070 0.450 - 4.500 uIU/mL  Urinalysis, Routine w reflex microscopic  Result Value Ref Range    Specific Gravity, UA 1.017  1.005 - 1.030   pH, UA 7.0 5.0 - 7.5   Color, UA Yellow Yellow   Appearance Ur Clear Clear   Leukocytes,UA Negative Negative   Protein,UA Negative Negative/Trace   Glucose, UA Negative Negative   Ketones, UA Negative Negative   RBC, UA Negative Negative   Bilirubin, UA Negative Negative   Urobilinogen, Ur 0.2 0.2 - 1.0 mg/dL   Nitrite, UA Negative Negative   Microscopic Examination Comment   Urine Microalbumin w/creat. ratio  Result Value Ref Range   Creatinine, Urine 93.2 Not Estab. mg/dL   Microalbumin, Urine 9.0 Not Estab. ug/mL   Microalb/Creat Ratio 10 0 - 29 mg/g creat  Hgb A1c w/o eAG  Result Value Ref Range   Hgb A1c MFr Bld 6.9 (H) 4.8 - 5.6 %      Assessment & Plan:   Problem List Items Addressed This Visit       Cardiovascular and Mediastinum   Hypertension associated with diabetes (HCC)    Under good control on current regimen. Continue current regimen. Continue to monitor. Call with any concerns. Refills given. Labs drawn today.        Relevant Medications   atorvastatin (LIPITOR) 40 MG tablet   benazepril (LOTENSIN) 40 MG tablet   metFORMIN (GLUCOPHAGE-XR) 750 MG 24 hr tablet     Endocrine   Controlled type 2 diabetes mellitus without complication, without long-term current use of insulin (HCC) - Primary    Rechecking labs today. Await results. Call with any concerns.       Relevant Medications   atorvastatin (LIPITOR) 40 MG tablet   benazepril (LOTENSIN) 40 MG tablet   metFORMIN (GLUCOPHAGE-XR) 750 MG 24 hr tablet   Other Relevant Orders   Bayer DCA Hb A1c Waived   CBC with Differential/Platelet   Comprehensive metabolic panel   Lipid Panel w/o Chol/HDL Ratio   Hyperlipidemia associated with type 2 diabetes mellitus (HCC)    Under good control on current regimen. Continue current regimen. Continue to monitor. Call with any concerns. Refills given. Labs drawn today.       Relevant Medications   atorvastatin (LIPITOR)  40 MG tablet   benazepril (LOTENSIN) 40 MG tablet   metFORMIN (GLUCOPHAGE-XR) 750 MG 24 hr tablet     Genitourinary   BPH with obstruction/lower urinary tract symptoms    Under good control on current regimen. Continue current regimen. Continue to monitor. Call with any concerns. Refills given. Labs drawn today.       Relevant Medications   finasteride (PROSCAR) 5 MG tablet   tamsulosin (FLOMAX) 0.4 MG CAPS capsule   Other Visit Diagnoses     Screening for colon cancer       Cologuard given today.   Relevant Orders   Cologuard        Follow up plan: Return in about 3 months (around 05/25/2023).

## 2023-02-23 ENCOUNTER — Other Ambulatory Visit: Payer: Self-pay | Admitting: Family Medicine

## 2023-02-23 DIAGNOSIS — D649 Anemia, unspecified: Secondary | ICD-10-CM

## 2023-02-23 LAB — CBC WITH DIFFERENTIAL/PLATELET
Basophils Absolute: 0 10*3/uL (ref 0.0–0.2)
Basos: 1 %
EOS (ABSOLUTE): 0.3 10*3/uL (ref 0.0–0.4)
Eos: 4 %
Hematocrit: 36.6 % — ABNORMAL LOW (ref 37.5–51.0)
Hemoglobin: 12.1 g/dL — ABNORMAL LOW (ref 13.0–17.7)
Immature Grans (Abs): 0 10*3/uL (ref 0.0–0.1)
Immature Granulocytes: 0 %
Lymphocytes Absolute: 1.5 10*3/uL (ref 0.7–3.1)
Lymphs: 27 %
MCH: 29.7 pg (ref 26.6–33.0)
MCHC: 33.1 g/dL (ref 31.5–35.7)
MCV: 90 fL (ref 79–97)
Monocytes Absolute: 0.4 10*3/uL (ref 0.1–0.9)
Monocytes: 7 %
Neutrophils Absolute: 3.5 10*3/uL (ref 1.4–7.0)
Neutrophils: 61 %
Platelets: 180 10*3/uL (ref 150–450)
RBC: 4.08 x10E6/uL — ABNORMAL LOW (ref 4.14–5.80)
RDW: 12.1 % (ref 11.6–15.4)
WBC: 5.7 10*3/uL (ref 3.4–10.8)

## 2023-02-23 LAB — COMPREHENSIVE METABOLIC PANEL
ALT: 21 IU/L (ref 0–44)
AST: 15 IU/L (ref 0–40)
Albumin: 4.3 g/dL (ref 3.9–4.9)
Alkaline Phosphatase: 53 IU/L (ref 44–121)
BUN/Creatinine Ratio: 21 (ref 10–24)
BUN: 16 mg/dL (ref 8–27)
Bilirubin Total: 1 mg/dL (ref 0.0–1.2)
CO2: 25 mmol/L (ref 20–29)
Calcium: 9.7 mg/dL (ref 8.6–10.2)
Chloride: 102 mmol/L (ref 96–106)
Creatinine, Ser: 0.78 mg/dL (ref 0.76–1.27)
Globulin, Total: 2.1 g/dL (ref 1.5–4.5)
Glucose: 146 mg/dL — ABNORMAL HIGH (ref 70–99)
Potassium: 5 mmol/L (ref 3.5–5.2)
Sodium: 140 mmol/L (ref 134–144)
Total Protein: 6.4 g/dL (ref 6.0–8.5)
eGFR: 98 mL/min/{1.73_m2} (ref >59)

## 2023-02-23 LAB — LIPID PANEL W/O CHOL/HDL RATIO
Cholesterol, Total: 127 mg/dL (ref 100–199)
HDL: 47 mg/dL (ref >39)
LDL Chol Calc (NIH): 68 mg/dL (ref 0–99)
Triglycerides: 56 mg/dL (ref 0–149)
VLDL Cholesterol Cal: 12 mg/dL (ref 5–40)

## 2023-02-28 ENCOUNTER — Ambulatory Visit: Payer: Self-pay

## 2023-02-28 NOTE — Telephone Encounter (Signed)
Chief Complaint: Need to know why I have to come in tomorrow for labs when I just had them done, is there anything wrong? Disposition: [] ED /[] Urgent Care (no appt availability in office) / [] Appointment(In office/virtual)/ []  [facility]/ [x] Home Care/ [] Refused Recommended Disposition /[] [facility]/ []  Follow-up with PCP Additional Notes: Advised based on the results, provider is concerned due to being a little anemic so she wants additional tests done to determine why. Then she will know how to treat if anything is wrong. He asks does he need to hold off on eating. Advised he can eat if he wants, it's not fasting labs. He verbalized understanding.    Summary: questions about labs   Patient called and wanted to know why he has to do more bloodwork tomorrow, he wanted to know was something found & also want to know if he needs to be fasting?  Patient callback # [phone]         Reason for Disposition  Health Information question, no triage required and triager able to answer question  Answer Assessment - Initial Assessment Questions 1. REASON FOR CALL or QUESTION: "What is your reason for calling today?" or "How can I best help you?" or "What question do you have that I can help answer?"     Just need to know why do I have to come in tomorrow morning for labs  Protocols used: Information Only Call - No Triage-A-AH

## 2023-02-28 NOTE — Telephone Encounter (Signed)
Patient called, no answer, recording VM not set up yet.   Summary: questions about labs   Patient called and wanted to know why he has to do more bloodwork tomorrow, he wanted to know was something found & also want to know if he needs to be fasting?  Patient callback # [phone]

## 2023-03-01 ENCOUNTER — Other Ambulatory Visit: Payer: Medicare HMO

## 2023-03-01 DIAGNOSIS — D649 Anemia, unspecified: Secondary | ICD-10-CM

## 2023-03-02 LAB — CBC WITH DIFFERENTIAL/PLATELET
Basophils Absolute: 0 10*3/uL (ref 0.0–0.2)
Basos: 1 %
EOS (ABSOLUTE): 0.3 10*3/uL (ref 0.0–0.4)
Eos: 5 %
Hematocrit: 36.3 % — ABNORMAL LOW (ref 37.5–51.0)
Hemoglobin: 12.2 g/dL — ABNORMAL LOW (ref 13.0–17.7)
Immature Grans (Abs): 0 10*3/uL (ref 0.0–0.1)
Immature Granulocytes: 0 %
Lymphocytes Absolute: 1.5 10*3/uL (ref 0.7–3.1)
Lymphs: 28 %
MCH: 30.3 pg (ref 26.6–33.0)
MCHC: 33.6 g/dL (ref 31.5–35.7)
MCV: 90 fL (ref 79–97)
Monocytes Absolute: 0.4 10*3/uL (ref 0.1–0.9)
Monocytes: 7 %
Neutrophils Absolute: 3.2 10*3/uL (ref 1.4–7.0)
Neutrophils: 59 %
Platelets: 174 10*3/uL (ref 150–450)
RBC: 4.02 x10E6/uL — ABNORMAL LOW (ref 4.14–5.80)
RDW: 12.1 % (ref 11.6–15.4)
WBC: 5.4 10*3/uL (ref 3.4–10.8)

## 2023-03-02 LAB — IRON AND TIBC
Iron Saturation: 30 % (ref 15–55)
Iron: 73 ug/dL (ref 38–169)
Total Iron Binding Capacity: 247 ug/dL — ABNORMAL LOW (ref 250–450)
UIBC: 174 ug/dL (ref 111–343)

## 2023-03-02 LAB — VITAMIN B12: Vitamin B-12: 294 pg/mL (ref 232–1245)

## 2023-03-02 LAB — FERRITIN: Ferritin: 485 ng/mL — ABNORMAL HIGH (ref 30–400)

## 2023-03-04 ENCOUNTER — Encounter: Payer: Self-pay | Admitting: Family Medicine

## 2023-03-04 ENCOUNTER — Other Ambulatory Visit: Payer: Self-pay | Admitting: Family Medicine

## 2023-03-04 MED ORDER — IRON (FERROUS SULFATE) 325 (65 FE) MG PO TABS: 325.0000 mg | ORAL_TABLET | Freq: Every day | ORAL | 1 refills | Status: DC

## 2023-03-06 DIAGNOSIS — H5213 Myopia, bilateral: Secondary | ICD-10-CM | POA: Diagnosis not present

## 2023-03-06 DIAGNOSIS — H2513 Age-related nuclear cataract, bilateral: Secondary | ICD-10-CM | POA: Diagnosis not present

## 2023-03-06 DIAGNOSIS — D649 Anemia, unspecified: Secondary | ICD-10-CM | POA: Diagnosis not present

## 2023-03-06 DIAGNOSIS — E119 Type 2 diabetes mellitus without complications: Secondary | ICD-10-CM | POA: Diagnosis not present

## 2023-03-06 DIAGNOSIS — Z0100 Encounter for examination of eyes and vision without abnormal findings: Secondary | ICD-10-CM | POA: Diagnosis not present

## 2023-03-06 LAB — HM DIABETES EYE EXAM

## 2023-03-07 NOTE — Progress Notes (Signed)
Confirmed with Iris that she did mail these out.

## 2023-03-08 LAB — FECAL OCCULT BLOOD, IMMUNOCHEMICAL: Fecal Occult Bld: NEGATIVE

## 2023-03-08 NOTE — Progress Notes (Signed)
Good morning, please let Scott Hester know his stool sample returned negative for blood.  Great news!!  Have a wonderful day!!

## 2023-03-28 ENCOUNTER — Encounter: Payer: Self-pay | Admitting: Family Medicine

## 2023-05-25 ENCOUNTER — Encounter: Payer: Self-pay | Admitting: Family Medicine

## 2023-05-25 ENCOUNTER — Ambulatory Visit (INDEPENDENT_AMBULATORY_CARE_PROVIDER_SITE_OTHER): Payer: Medicare HMO | Admitting: Family Medicine

## 2023-05-25 VITALS — BP 125/65 | HR 62 | Ht 69.0 in | Wt 190.6 lb

## 2023-05-25 DIAGNOSIS — Z23 Encounter for immunization: Secondary | ICD-10-CM | POA: Diagnosis not present

## 2023-05-25 DIAGNOSIS — Z7984 Long term (current) use of oral hypoglycemic drugs: Secondary | ICD-10-CM | POA: Diagnosis not present

## 2023-05-25 DIAGNOSIS — E119 Type 2 diabetes mellitus without complications: Secondary | ICD-10-CM | POA: Diagnosis not present

## 2023-05-25 LAB — BAYER DCA HB A1C WAIVED: HB A1C (BAYER DCA - WAIVED): 6.6 % — ABNORMAL HIGH (ref 4.8–5.6)

## 2023-05-25 NOTE — Progress Notes (Signed)
BP 125/65   Pulse 62   Ht 5\' 9"  (1.753 m)   Wt 190 lb 9.6 oz (86.5 kg)   SpO2 98%   BMI 28.15 kg/m    Subjective:    Patient ID: Scott Hester, male    DOB: 08-Apr-1956, 67 y.o.   MRN: [id number]  HPI: Scott Hester is a 67 y.o. male  Chief Complaint  Patient presents with   Diabetes   DIABETES Hypoglycemic episodes:no Polydipsia/polyuria: no Visual disturbance: no Chest pain: no Paresthesias: no Glucose Monitoring: yes  Accucheck frequency: Not Checking Taking Insulin?: no Blood Pressure Monitoring: not checking Retinal Examination: Up to Date Foot Exam: Up to Date Diabetic Education: Completed Pneumovax: Up to Date Influenza: Up to Date Aspirin: no   Relevant past medical, surgical, family and social history reviewed and updated as indicated. Interim medical history since our last visit reviewed. Allergies and medications reviewed and updated.  Review of Systems  Constitutional: Negative.   Respiratory: Negative.    Cardiovascular: Negative.   Musculoskeletal: Negative.   Skin: Negative.   Psychiatric/Behavioral: Negative.      Per HPI unless specifically indicated above     Objective:    BP 125/65   Pulse 62   Ht 5\' 9"  (1.753 m)   Wt 190 lb 9.6 oz (86.5 kg)   SpO2 98%   BMI 28.15 kg/m   Wt Readings from Last 3 Encounters:  05/25/23 190 lb 9.6 oz (86.5 kg)  02/22/23 185 lb 9.6 oz (84.2 kg)  08/24/22 189 lb 14.4 oz (86.1 kg)    Physical Exam Vitals and nursing note reviewed.  Constitutional:      General: He is not in acute distress.    Appearance: Normal appearance. He is not ill-appearing, toxic-appearing or diaphoretic.  HENT:     Head: Normocephalic and atraumatic.     Right Ear: External ear normal.     Left Ear: External ear normal.     Nose: Nose normal.     Mouth/Throat:     Mouth: Mucous membranes are moist.     Pharynx: Oropharynx is clear.  Eyes:     General: No scleral icterus.       Right eye: No discharge.        Left  eye: No discharge.     Extraocular Movements: Extraocular movements intact.     Conjunctiva/sclera: Conjunctivae normal.     Pupils: Pupils are equal, round, and reactive to light.  Cardiovascular:     Rate and Rhythm: Normal rate and regular rhythm.     Pulses: Normal pulses.     Heart sounds: Normal heart sounds. No murmur heard.    No friction rub. No gallop.  Pulmonary:     Effort: Pulmonary effort is normal. No respiratory distress.     Breath sounds: Normal breath sounds. No stridor. No wheezing, rhonchi or rales.  Chest:     Chest wall: No tenderness.  Musculoskeletal:        General: Normal range of motion.     Cervical back: Normal range of motion and neck supple.  Skin:    General: Skin is warm and dry.     Capillary Refill: Capillary refill takes less than 2 seconds.     Coloration: Skin is not jaundiced or pale.     Findings: No bruising, erythema, lesion or rash.  Neurological:     General: No focal deficit present.     Mental Status: He is alert and oriented  to person, place, and time. Mental status is at baseline.  Psychiatric:        Mood and Affect: Mood normal.        Behavior: Behavior normal.        Thought Content: Thought content normal.        Judgment: Judgment normal.     Results for orders placed or performed in visit on 03/28/23  HM DIABETES EYE EXAM  Result Value Ref Range   HM Diabetic Eye Exam No Retinopathy No Retinopathy      Assessment & Plan:   Problem List Items Addressed This Visit       Endocrine   Controlled type 2 diabetes mellitus without complication, without long-term current use of insulin (HCC)   Relevant Orders   Bayer DCA Hb A1c Waived   Other Visit Diagnoses     Needs flu shot    -  Primary   Relevant Orders   Flu Vaccine Trivalent High Dose (Fluad) (Completed)        Follow up plan: Return in about 3 months (around 08/25/2023) for physical.

## 2023-06-11 ENCOUNTER — Ambulatory Visit: Payer: Medicare HMO

## 2023-08-28 ENCOUNTER — Ambulatory Visit: Payer: Medicare HMO | Admitting: Family Medicine

## 2023-08-28 ENCOUNTER — Encounter: Payer: Self-pay | Admitting: Family Medicine

## 2023-08-28 VITALS — BP 118/65 | HR 62 | Temp 98.4°F | Resp 15 | Ht 70.12 in | Wt 196.0 lb

## 2023-08-28 DIAGNOSIS — E1169 Type 2 diabetes mellitus with other specified complication: Secondary | ICD-10-CM

## 2023-08-28 DIAGNOSIS — Z1211 Encounter for screening for malignant neoplasm of colon: Secondary | ICD-10-CM | POA: Diagnosis not present

## 2023-08-28 DIAGNOSIS — E785 Hyperlipidemia, unspecified: Secondary | ICD-10-CM

## 2023-08-28 DIAGNOSIS — I152 Hypertension secondary to endocrine disorders: Secondary | ICD-10-CM | POA: Diagnosis not present

## 2023-08-28 DIAGNOSIS — E1159 Type 2 diabetes mellitus with other circulatory complications: Secondary | ICD-10-CM

## 2023-08-28 DIAGNOSIS — N138 Other obstructive and reflux uropathy: Secondary | ICD-10-CM

## 2023-08-28 DIAGNOSIS — Z0000 Encounter for general adult medical examination without abnormal findings: Secondary | ICD-10-CM | POA: Diagnosis not present

## 2023-08-28 DIAGNOSIS — D692 Other nonthrombocytopenic purpura: Secondary | ICD-10-CM

## 2023-08-28 DIAGNOSIS — E119 Type 2 diabetes mellitus without complications: Secondary | ICD-10-CM

## 2023-08-28 DIAGNOSIS — Z23 Encounter for immunization: Secondary | ICD-10-CM | POA: Diagnosis not present

## 2023-08-28 DIAGNOSIS — N401 Enlarged prostate with lower urinary tract symptoms: Secondary | ICD-10-CM

## 2023-08-28 LAB — BAYER DCA HB A1C WAIVED: HB A1C (BAYER DCA - WAIVED): 6.9 % — ABNORMAL HIGH (ref 4.8–5.6)

## 2023-08-28 LAB — MICROALBUMIN, URINE WAIVED
Creatinine, Urine Waived: 100 mg/dL (ref 10–300)
Microalb, Ur Waived: 80 mg/L — ABNORMAL HIGH (ref 0–19)

## 2023-08-28 NOTE — Assessment & Plan Note (Signed)
 Under good control on current regimen. Continue current regimen. Continue to monitor. Call with any concerns. Refills given. Labs drawn today.

## 2023-08-28 NOTE — Assessment & Plan Note (Signed)
A1c up slightly at 6.9 from 6.6. Will work on diet and exercise and recheck in 3 months. Will increase metformin if goes >7. Call with any concerns.

## 2023-08-28 NOTE — Patient Instructions (Signed)
Preventative Services:  Health Risk Assessment and Personalized Prevention Plan: Done today Bone Mass Measurements: N/A CVD Screening: Done today Colon Cancer Screening: Ordered today Depression Screening: done today Diabetes Screening: done today Glaucoma Screening: see your eye doctor Hepatitis B vaccine: N/A Hepatitis C screening: up to date HIV Screening: up to date Flu Vaccine: up to date Lung cancer Screening: N/A Obesity Screening: done today Pneumonia Vaccines (2): up to date STI Screening: N/A PSA screening: Done today

## 2023-08-28 NOTE — Progress Notes (Signed)
Hearing Screen Results  20 dB HL   Right Ear Left Ear  500 Hz Pass [x]  Fail []  Pass [x]  Fail []   1,000 Hz Pass [x]  Fail []  Pass [x]  Fail []   2,000 Hz Pass [x]  Fail []  Pass [x]  Fail []   4,000 Hz Pass [x]  Fail []  Pass [x]  Fail []     25 dB HL   Right Ear Left Ear  500 Hz Pass [x]  Fail []  Pass [x]  Fail []   1,000 Hz Pass [x]  Fail []  Pass [x]  Fail []   2,000 Hz Pass [x]  Fail []  Pass [x]  Fail []   4,000 Hz Pass [x]  Fail []  Pass [x]  Fail []     40 dB HL   Right Ear Left Ear  500 Hz Pass [x]  Fail []  Pass [x]  Fail []   1,000 Hz Pass [x]  Fail []  Pass [x]  Fail []   2,000 Hz Pass [x]  Fail []  Pass [x]  Fail []   4,000 Hz Pass [x]  Fail []  Pass [x]  Fail []    Comments:

## 2023-08-28 NOTE — Progress Notes (Signed)
BP 118/65 (BP Location: Left Arm, Patient Position: Sitting, Cuff Size: Large)   Pulse 62   Temp 98.4 F (36.9 C) (Oral)   Resp 15   Ht 5' 10.12" (1.781 m)   Wt 196 lb (88.9 kg)   SpO2 100%   BMI 28.03 kg/m    Subjective:    Patient ID: Scott Hester, male    DOB: 15-Feb-1956, 68 y.o.   MRN: [id number]  HPI: Scott Hester is a 68 y.o. male presenting on 08/28/2023 for comprehensive medical examination. Current medical complaints include:  DIABETES Hypoglycemic episodes:no Polydipsia/polyuria: no Visual disturbance: no Chest pain: no Paresthesias: no Glucose Monitoring: no  Accucheck frequency: Not Checking Taking Insulin?: no Blood Pressure Monitoring: not checking Retinal Examination: Up to Date Foot Exam: Up to Date Diabetic Education: Completed Pneumovax: Up to Date Influenza: Up to Date Aspirin: no  HYPERTENSION / HYPERLIPIDEMIA Satisfied with current treatment? yes Duration of hypertension: chronic BP monitoring frequency: not checking BP medication side effects: no Past BP meds: benazepril Duration of hyperlipidemia: chronic Cholesterol medication side effects: no Cholesterol supplements: none Past cholesterol medications: atorvastatin Medication compliance: excellent compliance Aspirin: no Recent stressors: no Recurrent headaches: no Visual changes: no Palpitations: no Dyspnea: no Chest pain: no Lower extremity edema: no Dizzy/lightheaded: no  BPH BPH status: controlled Satisfied with current treatment?: no Medication side effects: no Medication compliance: excellent compliance Duration: chronic Nocturia: 1-2x per night Urinary frequency:no Incomplete voiding: no Urgency: no Weak urinary stream: no Straining to start stream: no Dysuria: no Onset: gradual Severity: mild  He currently lives with: wife Interim Problems from his last visit: no  Functional Status Survey: Is the patient deaf or have difficulty hearing?: Yes Does the  patient have difficulty seeing, even when wearing glasses/contacts?: Yes Does the patient have difficulty concentrating, remembering, or making decisions?: No Does the patient have difficulty walking or climbing stairs?: No Does the patient have difficulty dressing or bathing?: No Does the patient have difficulty doing errands alone such as visiting a doctor's office or shopping?: No  FALL RISK:    08/28/2023    8:13 AM 05/25/2023   10:10 AM 02/22/2023    9:02 AM 08/24/2022    9:11 AM 04/25/2022    8:21 AM  Fall Risk   Falls in the past year? 0 0 0 0 0  Number falls in past yr: 0 0 0 0 0  Injury with Fall? 0 0 0 0 0  Risk for fall due to : No Fall Risks No Fall Risks No Fall Risks No Fall Risks No Fall Risks  Follow up Falls evaluation completed Falls evaluation completed Falls evaluation completed Falls evaluation completed Falls evaluation completed    Depression Screen    08/28/2023    8:13 AM 05/25/2023   10:11 AM 02/22/2023    9:02 AM 08/24/2022    9:11 AM 04/25/2022    8:21 AM  Depression screen PHQ 2/9  Decreased Interest 0 0 0 0 0  Down, Depressed, Hopeless 0 0 0 0 0  PHQ - 2 Score 0 0 0 0 0  Altered sleeping 0 0 0 0 0  Tired, decreased energy 0 0 0 0 0  Change in appetite 0 0 0 0 0  Feeling bad or failure about yourself  0 0 0 0 0  Trouble concentrating 0 0 0 0 0  Moving slowly or fidgety/restless 0 0 0 0 0  Suicidal thoughts 0 0 0 0 0  PHQ-9 Score 0  0 0 0 0  Difficult doing work/chores Not difficult at all Not difficult at all Not difficult at all Not difficult at all Not difficult at all    Advanced Directives Does patient have a HCPOA?    no If yes, name and contact information:  Does patient have a living will or MOST form?  no  Past Medical History:  Past Medical History:  Diagnosis Date   BPH (benign prostatic hyperplasia)    Hypertension     Surgical History:  History reviewed. No pertinent surgical history.  Medications:  Current Outpatient  Medications on File Prior to Visit  Medication Sig   atorvastatin (LIPITOR) 40 MG tablet Take 1 tablet (40 mg total) by mouth daily.   benazepril (LOTENSIN) 40 MG tablet Take 1 tablet (40 mg total) by mouth daily.   finasteride (PROSCAR) 5 MG tablet Take 1 tablet (5 mg total) by mouth daily.   Iron, Ferrous Sulfate, 325 (65 Fe) MG TABS Take 325 mg by mouth daily.   metFORMIN (GLUCOPHAGE-XR) 750 MG 24 hr tablet Take 1 tablet (750 mg total) by mouth 2 (two) times daily.   tamsulosin (FLOMAX) 0.4 MG CAPS capsule Take 1 capsule (0.4 mg total) by mouth daily.   No current facility-administered medications on file prior to visit.    Allergies:  No Known Allergies  Social History:  Social History   Socioeconomic History   Marital status: Married    Spouse name: Gavin Pound   Number of children: 0   Years of education: Not on file   Highest education level: Not on file  Occupational History   Not on file  Tobacco Use   Smoking status: Never   Smokeless tobacco: Never  Vaping Use   Vaping status: Never Used  Substance and Sexual Activity   Alcohol use: No   Drug use: No   Sexual activity: Yes  Other Topics Concern   Not on file  Social History Narrative   Not on file   Social Drivers of Health   Financial Resource Strain: Low Risk  (08/28/2023)   Overall Financial Resource Strain (CARDIA)    Difficulty of Paying Living Expenses: Not hard at all  Food Insecurity: No Food Insecurity (08/28/2023)   Hunger Vital Sign    Worried About Running Out of Food in the Last Year: Never true    Ran Out of Food in the Last Year: Never true  Transportation Needs: No Transportation Needs (08/28/2023)   PRAPARE - Administrator, Civil Service (Medical): No    Lack of Transportation (Non-Medical): No  Physical Activity: Sufficiently Active (08/28/2023)   Exercise Vital Sign    Days of Exercise per Week: 5 days    Minutes of Exercise per Session: 60 min  Stress: No Stress Concern  Present (08/28/2023)   [employer] of Occupational Health - Occupational Stress Questionnaire    Feeling of Stress : Not at all  Social Connections: Moderately Integrated (08/28/2023)   Social Connection and Isolation Panel [NHANES]    Frequency of Communication with Friends and Family: More than three times a week    Frequency of Social Gatherings with Friends and Family: Never    Attends Religious Services: More than 4 times per year    Active Member of [employer] or Organizations: No    Attends Banker Meetings: Never    Marital Status: Married  Catering manager Violence: Not At Risk (08/28/2023)   Humiliation, Afraid, Rape, and Kick questionnaire  Fear of Current or Ex-Partner: No    Emotionally Abused: No    Physically Abused: No    Sexually Abused: No   Social History   Tobacco Use  Smoking Status Never  Smokeless Tobacco Never   Social History   Substance and Sexual Activity  Alcohol Use No    Family History:  Family History  Problem Relation Age of Onset   Hypertension Mother    Hypertension Father    Diabetes Father    Hypertension Maternal Grandmother    Diabetes Maternal Grandmother     Past medical history, surgical history, medications, allergies, family history and social history reviewed with patient today and changes made to appropriate areas of the chart.   Review of Systems  Constitutional: Negative.   HENT: Negative.    Eyes: Negative.   Respiratory: Negative.    Cardiovascular: Negative.   Gastrointestinal: Negative.   Genitourinary: Negative.   Musculoskeletal:  Positive for myalgias. Negative for back pain, falls, joint pain and neck pain.  Skin:  Positive for rash. Negative for itching.  Neurological: Negative.   Endo/Heme/Allergies:  Negative for environmental allergies and polydipsia. Bruises/bleeds easily.  Psychiatric/Behavioral: Negative.     All other ROS negative except what is listed above and in the HPI.       Objective:    BP 118/65 (BP Location: Left Arm, Patient Position: Sitting, Cuff Size: Large)   Pulse 62   Temp 98.4 F (36.9 C) (Oral)   Resp 15   Ht 5' 10.12" (1.781 m)   Wt 196 lb (88.9 kg)   SpO2 100%   BMI 28.03 kg/m   Wt Readings from Last 3 Encounters:  08/28/23 196 lb (88.9 kg)  05/25/23 190 lb 9.6 oz (86.5 kg)  02/22/23 185 lb 9.6 oz (84.2 kg)    Physical Exam Vitals and nursing note reviewed.  Constitutional:      General: He is not in acute distress.    Appearance: Normal appearance. He is not ill-appearing, toxic-appearing or diaphoretic.  HENT:     Head: Normocephalic and atraumatic.     Right Ear: Tympanic membrane, ear canal and external ear normal. There is no impacted cerumen.     Left Ear: Tympanic membrane, ear canal and external ear normal. There is no impacted cerumen.     Nose: Nose normal. No congestion or rhinorrhea.     Mouth/Throat:     Mouth: Mucous membranes are moist.     Pharynx: Oropharynx is clear. No oropharyngeal exudate or posterior oropharyngeal erythema.  Eyes:     General: No scleral icterus.       Right eye: No discharge.        Left eye: No discharge.     Extraocular Movements: Extraocular movements intact.     Conjunctiva/sclera: Conjunctivae normal.     Pupils: Pupils are equal, round, and reactive to light.  Neck:     Vascular: No carotid bruit.  Cardiovascular:     Rate and Rhythm: Normal rate and regular rhythm.     Pulses: Normal pulses.     Heart sounds: No murmur heard.    No friction rub. No gallop.  Pulmonary:     Effort: Pulmonary effort is normal. No respiratory distress.     Breath sounds: Normal breath sounds. No stridor. No wheezing, rhonchi or rales.  Chest:     Chest wall: No tenderness.  Abdominal:     General: Abdomen is flat. Bowel sounds are normal. There is no distension.  Palpations: Abdomen is soft. There is no mass.     Tenderness: There is no abdominal tenderness. There is no right CVA  tenderness, left CVA tenderness, guarding or rebound.     Hernia: No hernia is present.  Genitourinary:    Comments: Genital exam deferred with shared decision making Musculoskeletal:        General: No swelling, tenderness, deformity or signs of injury.     Cervical back: Normal range of motion and neck supple. No rigidity. No muscular tenderness.     Right lower leg: No edema.     Left lower leg: No edema.  Lymphadenopathy:     Cervical: No cervical adenopathy.  Skin:    General: Skin is warm and dry.     Capillary Refill: Capillary refill takes less than 2 seconds.     Coloration: Skin is not jaundiced or pale.     Findings: No bruising, erythema, lesion or rash.  Neurological:     General: No focal deficit present.     Mental Status: He is alert and oriented to person, place, and time.     Cranial Nerves: No cranial nerve deficit.     Sensory: No sensory deficit.     Motor: No weakness.     Coordination: Coordination normal.     Gait: Gait normal.     Deep Tendon Reflexes: Reflexes normal.  Psychiatric:        Mood and Affect: Mood normal.        Behavior: Behavior normal.        Thought Content: Thought content normal.        Judgment: Judgment normal.        08/28/2023    8:24 AM 08/24/2022    9:52 AM  6CIT Screen  What Year? 0 points 0 points  What month? 0 points 0 points  What time? 0 points 0 points  Count back from 20 0 points 0 points  Months in reverse 0 points 0 points  Repeat phrase 4 points 0 points  Total Score 4 points 0 points   Results for orders placed or performed in visit on 05/25/23  Bayer DCA Hb A1c Waived   Collection Time: 05/25/23 10:16 AM  Result Value Ref Range   HB A1C (BAYER DCA - WAIVED) 6.6 (H) 4.8 - 5.6 %      Assessment & Plan:   Problem List Items Addressed This Visit       Cardiovascular and Mediastinum   Hypertension associated with diabetes (HCC)   Under good control on current regimen. Continue current regimen.  Continue to monitor. Call with any concerns. Refills given. Labs drawn today.        Senile purpura (HCC)   Reassured patient. Stable.         Endocrine   Controlled type 2 diabetes mellitus without complication, without long-term current use of insulin (HCC)   A1c up slightly at 6.9 from 6.6. Will work on diet and exercise and recheck in 3 months. Will increase metformin if goes >7. Call with any concerns.       Relevant Orders   Bayer DCA Hb A1c Waived   Comprehensive metabolic panel   CBC with Differential/Platelet   Lipid Panel w/o Chol/HDL Ratio   TSH   Microalbumin, Urine Waived   Hyperlipidemia associated with type 2 diabetes mellitus (HCC)   Under good control on current regimen. Continue current regimen. Continue to monitor. Call with any concerns. Refills given. Labs drawn today.  Genitourinary   BPH with obstruction/lower urinary tract symptoms   Under good control on current regimen. Continue current regimen. Continue to monitor. Call with any concerns. Refills given. Labs drawn today.       Relevant Orders   PSA   Other Visit Diagnoses       Encounter for annual wellness exam in Medicare patient    -  Primary   Preventative care discussed today as below. Call with any concerns.     Routine general medical examination at a health care facility       Vaccines up to date. Screening labs checked today. Cologuard ordered. Continue diet and exercise. Call with any concerns.     Screening for colon cancer       Cologuard ordered today.   Relevant Orders   Cologuard     Need for COVID-19 vaccine       Covid vaccine given today.   Relevant Orders   Civil engineer, contracting Covid -19 Vaccine 70yrs and older        Preventative Services:  Health Risk Assessment and Personalized Prevention Plan: Done today Bone Mass Measurements: N/A CVD Screening: Done today Colon Cancer Screening: Ordered today Depression Screening: done today Diabetes Screening: done  today Glaucoma Screening: see your eye doctor Hepatitis B vaccine: N/A Hepatitis C screening: up to date HIV Screening: up to date Flu Vaccine: up to date Lung cancer Screening: N/A Obesity Screening: done today Pneumonia Vaccines (2): up to date STI Screening: N/A PSA screening: Done today  LABORATORY TESTING:  Health maintenance labs ordered today as discussed above.   The natural history of prostate cancer and ongoing controversy regarding screening and potential treatment outcomes of prostate cancer has been discussed with the patient. The meaning of a false positive PSA and a false negative PSA has been discussed. He indicates understanding of the limitations of this screening test and wishes to proceed with screening PSA testing.   IMMUNIZATIONS:   - Tdap: Tetanus vaccination status reviewed: last tetanus booster within 10 years. - Influenza: Up to date - Pneumovax: Up to date - Prevnar: Up to date - Zostavax vaccine: Up to date  SCREENING: - Colonoscopy: Ordered today  Discussed with patient purpose of the colonoscopy is to detect colon cancer at curable precancerous or early stages   PATIENT COUNSELING:    Sexuality: Discussed sexually transmitted diseases, partner selection, use of condoms, avoidance of unintended pregnancy  and contraceptive alternatives.   Advised to avoid cigarette smoking.  I discussed with the patient that most people either abstain from alcohol or drink within safe limits (<=14/week and <=4 drinks/occasion for males, <=7/weeks and <= 3 drinks/occasion for females) and that the risk for alcohol disorders and other health effects rises proportionally with the number of drinks per week and how often a drinker exceeds daily limits.  Discussed cessation/primary prevention of drug use and availability of treatment for abuse.   Diet: Encouraged to adjust caloric intake to maintain  or achieve ideal body weight, to reduce intake of dietary saturated fat  and total fat, to limit sodium intake by avoiding high sodium foods and not adding table salt, and to maintain adequate dietary potassium and calcium preferably from fresh fruits, vegetables, and low-fat dairy products.    stressed the importance of regular exercise  Injury prevention: Discussed safety belts, safety helmets, smoke detector, smoking near bedding or upholstery.   Dental health: Discussed importance of regular tooth brushing, flossing, and dental visits.   Follow up plan:  NEXT PREVENTATIVE PHYSICAL DUE IN 1 YEAR. Return in about 3 months (around 11/25/2023).

## 2023-08-28 NOTE — Assessment & Plan Note (Signed)
 Reassured patient. Stable.

## 2023-08-29 LAB — COMPREHENSIVE METABOLIC PANEL
ALT: 27 [IU]/L (ref 0–44)
AST: 18 [IU]/L (ref 0–40)
Albumin: 4.4 g/dL (ref 3.9–4.9)
Alkaline Phosphatase: 55 [IU]/L (ref 44–121)
BUN/Creatinine Ratio: 17 (ref 10–24)
BUN: 15 mg/dL (ref 8–27)
Bilirubin Total: 0.7 mg/dL (ref 0.0–1.2)
CO2: 26 mmol/L (ref 20–29)
Calcium: 9.5 mg/dL (ref 8.6–10.2)
Chloride: 102 mmol/L (ref 96–106)
Creatinine, Ser: 0.87 mg/dL (ref 0.76–1.27)
Globulin, Total: 2.2 g/dL (ref 1.5–4.5)
Glucose: 149 mg/dL — ABNORMAL HIGH (ref 70–99)
Potassium: 4.9 mmol/L (ref 3.5–5.2)
Sodium: 141 mmol/L (ref 134–144)
Total Protein: 6.6 g/dL (ref 6.0–8.5)
eGFR: 95 mL/min/{1.73_m2} (ref >59)

## 2023-08-29 LAB — CBC WITH DIFFERENTIAL/PLATELET
Basophils Absolute: 0.1 10*3/uL (ref 0.0–0.2)
Basos: 1 %
EOS (ABSOLUTE): 0.4 10*3/uL (ref 0.0–0.4)
Eos: 6 %
Hematocrit: 38.4 % (ref 37.5–51.0)
Hemoglobin: 12.8 g/dL — ABNORMAL LOW (ref 13.0–17.7)
Immature Grans (Abs): 0 10*3/uL (ref 0.0–0.1)
Immature Granulocytes: 0 %
Lymphocytes Absolute: 1.5 10*3/uL (ref 0.7–3.1)
Lymphs: 24 %
MCH: 30.2 pg (ref 26.6–33.0)
MCHC: 33.3 g/dL (ref 31.5–35.7)
MCV: 91 fL (ref 79–97)
Monocytes Absolute: 0.5 10*3/uL (ref 0.1–0.9)
Monocytes: 7 %
Neutrophils Absolute: 4 10*3/uL (ref 1.4–7.0)
Neutrophils: 62 %
Platelets: 173 10*3/uL (ref 150–450)
RBC: 4.24 x10E6/uL (ref 4.14–5.80)
RDW: 12.6 % (ref 11.6–15.4)
WBC: 6.4 10*3/uL (ref 3.4–10.8)

## 2023-08-29 LAB — LIPID PANEL W/O CHOL/HDL RATIO
Cholesterol, Total: 129 mg/dL (ref 100–199)
HDL: 46 mg/dL (ref >39)
LDL Chol Calc (NIH): 68 mg/dL (ref 0–99)
Triglycerides: 75 mg/dL (ref 0–149)
VLDL Cholesterol Cal: 15 mg/dL (ref 5–40)

## 2023-08-29 LAB — TSH: TSH: 1.68 u[IU]/mL (ref 0.450–4.500)

## 2023-08-29 LAB — PSA: Prostate Specific Ag, Serum: 0.5 ng/mL (ref 0.0–4.0)

## 2023-09-02 ENCOUNTER — Encounter: Payer: Self-pay | Admitting: Family Medicine

## 2023-09-03 NOTE — Progress Notes (Signed)
Results printed and mailed.   

## 2023-09-06 NOTE — Progress Notes (Signed)
 Letter printed and mailed to patient.

## 2023-10-06 ENCOUNTER — Other Ambulatory Visit: Payer: Self-pay | Admitting: Family Medicine

## 2023-10-06 DIAGNOSIS — E1159 Type 2 diabetes mellitus with other circulatory complications: Secondary | ICD-10-CM

## 2023-10-06 DIAGNOSIS — I152 Hypertension secondary to endocrine disorders: Secondary | ICD-10-CM

## 2023-10-09 NOTE — Telephone Encounter (Signed)
 Requested Prescriptions  Pending Prescriptions Disp Refills   benazepril (LOTENSIN) 40 MG tablet [Pharmacy Med Name: BENAZEPRIL HCL 40MG  TABS] 90 tablet 0    Sig: TAKE ONE TABLET BY MOUTH EVERY DAY     Cardiovascular:  ACE Inhibitors Passed - 10/09/2023 10:45 AM      Passed - Cr in normal range and within 180 days    Creatinine, Ser  Date Value Ref Range Status  08/28/2023 0.87 0.76 - 1.27 mg/dL Final         Passed - K in normal range and within 180 days    Potassium  Date Value Ref Range Status  08/28/2023 4.9 3.5 - 5.2 mmol/L Final         Passed - Patient is not pregnant      Passed - Last BP in normal range    BP Readings from Last 1 Encounters:  08/28/23 118/65         Passed - Valid encounter within last 6 months    Recent Outpatient Visits           1 month ago Encounter for annual wellness exam in Medicare patient   [place] [facility] [place], Oralia Rud, DO       Future Appointments             In 1 month Laural Benes, Oralia Rud, DO [place] [facility]

## 2023-10-13 ENCOUNTER — Other Ambulatory Visit: Payer: Self-pay | Admitting: Family Medicine

## 2023-10-13 DIAGNOSIS — N138 Other obstructive and reflux uropathy: Secondary | ICD-10-CM

## 2023-10-13 DIAGNOSIS — N401 Enlarged prostate with lower urinary tract symptoms: Secondary | ICD-10-CM

## 2023-10-15 NOTE — Telephone Encounter (Signed)
 Requested Prescriptions  Pending Prescriptions Disp Refills   finasteride (PROSCAR) 5 MG tablet [Pharmacy Med Name: FINASTERIDE 5MG  TABS] 90 tablet 1    Sig: TAKE ONE TABLET BY MOUTH EVERY DAY     Urology: 5-alpha Reductase Inhibitors Passed - 10/15/2023  3:06 PM      Passed - PSA in normal range and within 360 days    Prostate Specific Ag, Serum  Date Value Ref Range Status  08/28/2023 0.5 0.0 - 4.0 ng/mL Final    Comment:    Roche ECLIA methodology. According to the American Urological Association, Serum PSA should decrease and remain at undetectable levels after radical prostatectomy. The AUA defines biochemical recurrence as an initial PSA value 0.2 ng/mL or greater followed by a subsequent confirmatory PSA value 0.2 ng/mL or greater. Values obtained with different assay methods or kits cannot be used interchangeably. Results cannot be interpreted as absolute evidence of the presence or absence of malignant disease.          Passed - Valid encounter within last 12 months    Recent Outpatient Visits           1 month ago Encounter for annual wellness exam in Medicare patient   [place] [facility], Oralia Rud, DO       Future Appointments             In 1 month Laural Benes, Oralia Rud, DO [place] [facility], [facility]             tamsulosin (FLOMAX) 0.4 MG CAPS capsule [Pharmacy Med Name: TAMSULOSIN HCL 0.4MG  CAPS] 90 capsule 1    Sig: TAKE ONE CAPSULE BY MOUTH EVERY DAY     Urology: Alpha-Adrenergic Blocker Passed - 10/15/2023  3:06 PM      Passed - PSA in normal range and within 360 days    Prostate Specific Ag, Serum  Date Value Ref Range Status  08/28/2023 0.5 0.0 - 4.0 ng/mL Final    Comment:    Roche ECLIA methodology. According to the American Urological Association, Serum PSA should decrease and remain at undetectable levels after radical prostatectomy. The AUA defines biochemical recurrence as an initial PSA value 0.2 ng/mL or  greater followed by a subsequent confirmatory PSA value 0.2 ng/mL or greater. Values obtained with different assay methods or kits cannot be used interchangeably. Results cannot be interpreted as absolute evidence of the presence or absence of malignant disease.          Passed - Last BP in normal range    BP Readings from Last 1 Encounters:  08/28/23 118/65         Passed - Valid encounter within last 12 months    Recent Outpatient Visits           1 month ago Encounter for annual wellness exam in Medicare patient   [place] [facility] Dorcas Carrow, DO       Future Appointments             In 1 month Laural Benes, Oralia Rud, DO [place] [facility], [facility]

## 2023-11-17 ENCOUNTER — Other Ambulatory Visit: Payer: Self-pay | Admitting: Family Medicine

## 2023-11-20 NOTE — Telephone Encounter (Signed)
 Requested Prescriptions  Pending Prescriptions Disp Refills   metFORMIN  (GLUCOPHAGE -XR) 750 MG 24 hr tablet [Pharmacy Med Name: METFORMIN  HCL ER 750MG  TB24] 180 tablet 1    Sig: TAKE ONE TABLET BY MOUTH TWICE A DAY     Endocrinology:  Diabetes - Biguanides Passed - 11/20/2023  9:15 AM      Passed - Cr in normal range and within 360 days    Creatinine, Ser  Date Value Ref Range Status  08/28/2023 0.87 0.76 - 1.27 mg/dL Final         Passed - HBA1C is between 0 and 7.9 and within 180 days    HB A1C (BAYER DCA - WAIVED)  Date Value Ref Range Status  08/28/2023 6.9 (H) 4.8 - 5.6 % Final    Comment:             Prediabetes: 5.7 - 6.4          Diabetes: >6.4          Glycemic control for adults with diabetes: <7.0          Passed - eGFR in normal range and within 360 days    GFR calc Af Amer  Date Value Ref Range Status  07/15/2020 115 >59 mL/min/1.73 Final    Comment:    **In accordance with recommendations from the NKF-ASN Task force,**   [facility] is in the process of updating its eGFR calculation to the   2021 CKD-EPI creatinine equation that estimates kidney function   without a race variable.    GFR, Estimated  Date Value Ref Range Status  06/24/2020 >60 >60 mL/min Final    Comment:    (NOTE) Calculated using the CKD-EPI Creatinine Equation (2021)    GFR calc non Af Amer  Date Value Ref Range Status  07/15/2020 99 >59 mL/min/1.73 Final   eGFR  Date Value Ref Range Status  08/28/2023 95 >59 mL/min/1.73 Final         Passed - B12 Level in normal range and within 720 days    Vitamin B-12  Date Value Ref Range Status  03/01/2023 294 232 - 1,245 pg/mL Final         Passed - Valid encounter within last 6 months    Recent Outpatient Visits           2 months ago Encounter for annual wellness exam in Medicare patient   [place] [facility] [place], Jerilee Montane, DO       Future Appointments             In 1 week Lincoln Renshaw, Jerilee Montane, DO [place]  [facility]            Passed - CBC within normal limits and completed in the last 12 months    WBC  Date Value Ref Range Status  08/28/2023 6.4 3.4 - 10.8 x10E3/uL Final  11/17/2018 7.7 4.0 - 10.5 K/uL Final   RBC  Date Value Ref Range Status  08/28/2023 4.24 4.14 - 5.80 x10E6/uL Final  11/17/2018 4.03 (L) 4.22 - 5.81 MIL/uL Final   Hemoglobin  Date Value Ref Range Status  08/28/2023 12.8 (L) 13.0 - 17.7 g/dL Final   Hematocrit  Date Value Ref Range Status  08/28/2023 38.4 37.5 - 51.0 % Final   MCHC  Date Value Ref Range Status  08/28/2023 33.3 31.5 - 35.7 g/dL Final  72/53/6644 03.4 30.0 - 36.0 g/dL Final   [facility]  Date Value Ref Range  Status  08/28/2023 30.2 26.6 - 33.0 pg Final  11/17/2018 29.5 26.0 - 34.0 pg Final   MCV  Date Value Ref Range Status  08/28/2023 91 79 - 97 fL Final   No results found for: "PLTCOUNTKUC", "LABPLAT", "POCPLA" RDW  Date Value Ref Range Status  08/28/2023 12.6 11.6 - 15.4 % Final

## 2023-11-29 ENCOUNTER — Ambulatory Visit: Payer: Medicare HMO | Admitting: Family Medicine

## 2023-11-29 ENCOUNTER — Encounter: Payer: Self-pay | Admitting: Family Medicine

## 2023-11-29 VITALS — BP 125/79 | HR 65 | Ht 70.0 in | Wt 185.4 lb

## 2023-11-29 DIAGNOSIS — E1159 Type 2 diabetes mellitus with other circulatory complications: Secondary | ICD-10-CM | POA: Diagnosis not present

## 2023-11-29 DIAGNOSIS — I152 Hypertension secondary to endocrine disorders: Secondary | ICD-10-CM | POA: Diagnosis not present

## 2023-11-29 DIAGNOSIS — Z7984 Long term (current) use of oral hypoglycemic drugs: Secondary | ICD-10-CM | POA: Diagnosis not present

## 2023-11-29 DIAGNOSIS — E119 Type 2 diabetes mellitus without complications: Secondary | ICD-10-CM | POA: Diagnosis not present

## 2023-11-29 LAB — BAYER DCA HB A1C WAIVED: HB A1C (BAYER DCA - WAIVED): 6.7 % — ABNORMAL HIGH (ref 4.8–5.6)

## 2023-11-29 MED ORDER — ATORVASTATIN CALCIUM 40 MG PO TABS: 40.0000 mg | ORAL_TABLET | Freq: Every day | ORAL | 1 refills | Status: DC

## 2023-11-29 MED ORDER — DICLOFENAC SODIUM 1 % EX GEL: 4.0000 g | Freq: Four times a day (QID) | CUTANEOUS | 2 refills | Status: AC

## 2023-11-29 MED ORDER — BENAZEPRIL HCL 40 MG PO TABS
40.0000 mg | ORAL_TABLET | Freq: Every day | ORAL | 1 refills | Status: DC
Start: 2023-11-29 — End: 2024-03-03

## 2023-11-29 NOTE — Progress Notes (Signed)
 BP 125/79 (BP Location: Left Arm, Patient Position: Sitting, Cuff Size: Normal)   Pulse 65   Ht 5\' 10"  (1.778 m)   Wt 196 lb (88.9 kg)   SpO2 98%   BMI 28.12 kg/m    Subjective:    Patient ID: Scott Hester, male    DOB: April 20, 1956, 68 y.o.   MRN: [id number]  HPI: Scott Hester is a 68 y.o. male  Chief Complaint  Patient presents with   Diabetes   DIABETES Hypoglycemic episodes:no Polydipsia/polyuria: no Visual disturbance: no Chest pain: no Paresthesias: no Glucose Monitoring: no  Accucheck frequency: Not Checking Taking Insulin ?: no Blood Pressure Monitoring: rarely Retinal Examination: Up to Date Foot Exam: Up to Date Diabetic Education: Completed Pneumovax: Up to Date Influenza: Up to Date Aspirin: no  Has been having some popping in his L thumb. Not hurting.   Relevant past medical, surgical, family and social history reviewed and updated as indicated. Interim medical history since our last visit reviewed. Allergies and medications reviewed and updated.  Review of Systems  Constitutional: Negative.   Respiratory: Negative.    Cardiovascular: Negative.   Musculoskeletal: Negative.   Neurological: Negative.   Psychiatric/Behavioral: Negative.      Per HPI unless specifically indicated above     Objective:     BP 125/79 (BP Location: Left Arm, Patient Position: Sitting, Cuff Size: Normal)   Pulse 65   Ht 5\' 10"  (1.778 m)   Wt 196 lb (88.9 kg)   SpO2 98%   BMI 28.12 kg/m   Wt Readings from Last 3 Encounters:  11/29/23 196 lb (88.9 kg)  08/28/23 196 lb (88.9 kg)  05/25/23 190 lb 9.6 oz (86.5 kg)    Physical Exam Vitals and nursing note reviewed.  Constitutional:      General: He is not in acute distress.    Appearance: Normal appearance. He is not ill-appearing, toxic-appearing or diaphoretic.  HENT:     Head: Normocephalic and atraumatic.     Right Ear: External ear normal.     Left Ear: External ear normal.     Nose: Nose normal.      Mouth/Throat:     Mouth: Mucous membranes are moist.     Pharynx: Oropharynx is clear.  Eyes:     General: No scleral icterus.       Right eye: No discharge.        Left eye: No discharge.     Extraocular Movements: Extraocular movements intact.     Conjunctiva/sclera: Conjunctivae normal.     Pupils: Pupils are equal, round, and reactive to light.  Cardiovascular:     Rate and Rhythm: Normal rate and regular rhythm.     Pulses: Normal pulses.     Heart sounds: Normal heart sounds. No murmur heard.    No friction rub. No gallop.  Pulmonary:     Effort: Pulmonary effort is normal. No respiratory distress.     Breath sounds: Normal breath sounds. No stridor. No wheezing, rhonchi or rales.  Chest:     Chest wall: No tenderness.  Musculoskeletal:        General: Normal range of motion.     Cervical back: Normal range of motion and neck supple.  Skin:    General: Skin is warm and dry.     Capillary Refill: Capillary refill takes less than 2 seconds.     Coloration: Skin is not jaundiced or pale.     Findings: No bruising, erythema, lesion  or rash.  Neurological:     General: No focal deficit present.     Mental Status: He is alert and oriented to person, place, and time. Mental status is at baseline.  Psychiatric:        Mood and Affect: Mood normal.        Behavior: Behavior normal.        Thought Content: Thought content normal.        Judgment: Judgment normal.     Results for orders placed or performed in visit on 08/28/23  Bayer DCA Hb A1c Waived   Collection Time: 08/28/23  8:44 AM  Result Value Ref Range   HB A1C (BAYER DCA - WAIVED) 6.9 (H) 4.8 - 5.6 %  Microalbumin, Urine Waived   Collection Time: 08/28/23  8:44 AM  Result Value Ref Range   Microalb, Ur Waived 80 (H) 0 - 19 mg/L   Creatinine, Urine Waived 100 10 - 300 mg/dL   Microalb/Creat Ratio 30-300 (H) <30 mg/g  Comprehensive metabolic panel   Collection Time: 08/28/23  8:45 AM  Result Value Ref Range    Glucose 149 (H) 70 - 99 mg/dL   BUN 15 8 - 27 mg/dL   Creatinine, Ser 2.95 0.76 - 1.27 mg/dL   eGFR 95 >28 UX/LKG/4.01   BUN/Creatinine Ratio 17 10 - 24   Sodium 141 134 - 144 mmol/L   Potassium 4.9 3.5 - 5.2 mmol/L   Chloride 102 96 - 106 mmol/L   CO2 26 20 - 29 mmol/L   Calcium  9.5 8.6 - 10.2 mg/dL   Total Protein 6.6 6.0 - 8.5 g/dL   Albumin 4.4 3.9 - 4.9 g/dL   Globulin, Total 2.2 1.5 - 4.5 g/dL   Bilirubin Total 0.7 0.0 - 1.2 mg/dL   Alkaline Phosphatase 55 44 - 121 IU/L   AST 18 0 - 40 IU/L   ALT 27 0 - 44 IU/L  CBC with Differential/Platelet   Collection Time: 08/28/23  8:45 AM  Result Value Ref Range   WBC 6.4 3.4 - 10.8 x10E3/uL   RBC 4.24 4.14 - 5.80 x10E6/uL   Hemoglobin 12.8 (L) 13.0 - 17.7 g/dL   Hematocrit 02.7 25.3 - 51.0 %   MCV 91 79 - 97 fL   MCH 30.2 26.6 - 33.0 pg   MCHC 33.3 31.5 - 35.7 g/dL   RDW 66.4 40.3 - 47.4 %   Platelets 173 150 - 450 x10E3/uL   Neutrophils 62 Not Estab. %   Lymphs 24 Not Estab. %   Monocytes 7 Not Estab. %   Eos 6 Not Estab. %   Basos 1 Not Estab. %   Neutrophils Absolute 4.0 1.4 - 7.0 x10E3/uL   Lymphocytes Absolute 1.5 0.7 - 3.1 x10E3/uL   Monocytes Absolute 0.5 0.1 - 0.9 x10E3/uL   EOS (ABSOLUTE) 0.4 0.0 - 0.4 x10E3/uL   Basophils Absolute 0.1 0.0 - 0.2 x10E3/uL   Immature Granulocytes 0 Not Estab. %   Immature Grans (Abs) 0.0 0.0 - 0.1 x10E3/uL  Lipid Panel w/o Chol/HDL Ratio   Collection Time: 08/28/23  8:45 AM  Result Value Ref Range   Cholesterol, Total 129 100 - 199 mg/dL   Triglycerides 75 0 - 149 mg/dL   HDL 46 >25 mg/dL   VLDL Cholesterol Cal 15 5 - 40 mg/dL   LDL Chol Calc (NIH) 68 0 - 99 mg/dL  PSA   Collection Time: 08/28/23  8:45 AM  Result Value Ref Range   Prostate  Specific Ag, Serum 0.5 0.0 - 4.0 ng/mL  TSH   Collection Time: 08/28/23  8:45 AM  Result Value Ref Range   TSH 1.680 0.450 - 4.500 uIU/mL      Assessment & Plan:   Problem List Items Addressed This Visit       Cardiovascular and  Mediastinum   Hypertension associated with diabetes (HCC)   Relevant Medications   atorvastatin  (LIPITOR) 40 MG tablet   benazepril  (LOTENSIN ) 40 MG tablet     Endocrine   Controlled type 2 diabetes mellitus without complication, without long-term current use of insulin  (HCC) - Primary   Doing great with A1c of 6.7. Continue current regimen. Continue to monitor. Call with any concerns.       Relevant Medications   atorvastatin  (LIPITOR) 40 MG tablet   benazepril  (LOTENSIN ) 40 MG tablet   Other Relevant Orders   Bayer DCA Hb A1c Waived     Follow up plan: Return in about 3 months (around 02/29/2024).

## 2023-11-29 NOTE — Assessment & Plan Note (Signed)
Doing great with A1c of 6.7. Continue current regimen. Continue to monitor. Call with any concerns.  

## 2023-12-21 ENCOUNTER — Ambulatory Visit: Payer: Self-pay

## 2023-12-21 NOTE — Telephone Encounter (Signed)
 FYI Only or Action Required?: Action required by provider  Patient was last seen in primary care on 11/29/2023 by Terre Ferri P, DO. Called Nurse Triage reporting Thumb pain. Symptoms began about a month ago. Interventions attempted: Prescription medications: Voltaren  Gel and Rest, hydration, or home remedies. Symptoms are: unchanged.  Triage Disposition: See PCP When Office is Open (Within 3 Days)-patient is wanting recommendations to help with thumb pain.   Patient/caregiver understands and will follow disposition?: No, wishes to speak with PCP  Copied from CRM [id number]. Topic: Clinical - Red Word Triage >> Dec 21, 2023  4:14 PM Zipporah Him wrote: Red Word that prompted transfer to Nurse Triage: Patient has been putting ointment from Dr Lincoln Renshaw on this thumb and he is almost out. He is stating that is not working. He experiencing pain in his left thumb that keeps him awake at night. States he cannot bend it. Reason for Disposition  [1] MODERATE pain (e.g., interferes with normal activities) AND [2] present > 3 days  Answer Assessment - Initial Assessment Questions 1. ONSET: When did the pain start?      Started about a month ago 2. LOCATION and RADIATION: Where is the pain located?  (e.g., fingertip, around nail, joint, entire  finger)      Left thumb-pain around the joint 3. SEVERITY: How bad is the pain? What does it keep you from doing?   (Scale 1-10; or mild, moderate, severe)  - MILD (1-3): doesn't interfere with normal activities.   - MODERATE (4-7): interferes with normal activities or awakens from sleep.  - SEVERE (8-10): excruciating pain, unable to hold a glass of water or bend finger even a little.     No pain currently but pain comes in episodes.  4. APPEARANCE: What does the finger look like? (e.g., redness, swelling, bruising, pallor)     No swelling, bruising 5. WORK OR EXERCISE: Has there been any recent work or exercise that involved this part (i.e., fingers or  hand) of the body?     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  6. CAUSE: What do you think is causing the pain?     unsure 7. AGGRAVATING FACTORS: What makes the pain worse? (e.g., using computer)     Opening cans 8. OTHER SYMPTOMS: Do you have any other symptoms? (e.g., fever, neck pain, numbness)     Numbness and tingling  Patient reports that left thumb won't bend. Patient reports using tylenol  and ibuprofen at times. Patient endorses using the gel that was ordered at his last appointment. Patient states he hasn't seen any difference in pain. Patient is asking for recommendations to help with the pain in his thumb.  Protocols used: Finger Pain-A-AH

## 2023-12-21 NOTE — Telephone Encounter (Signed)
 Forwarding to provider for additional recommendations.

## 2023-12-23 NOTE — Telephone Encounter (Signed)
Appt please

## 2023-12-25 ENCOUNTER — Ambulatory Visit (INDEPENDENT_AMBULATORY_CARE_PROVIDER_SITE_OTHER): Admitting: Family Medicine

## 2023-12-25 ENCOUNTER — Encounter: Payer: Self-pay | Admitting: Family Medicine

## 2023-12-25 VITALS — BP 118/70 | HR 62 | Temp 98.0°F | Ht 70.0 in | Wt 189.8 lb

## 2023-12-25 DIAGNOSIS — M79645 Pain in left finger(s): Secondary | ICD-10-CM

## 2023-12-25 DIAGNOSIS — M65312 Trigger thumb, left thumb: Secondary | ICD-10-CM | POA: Diagnosis not present

## 2023-12-25 DIAGNOSIS — M1812 Unilateral primary osteoarthritis of first carpometacarpal joint, left hand: Secondary | ICD-10-CM | POA: Diagnosis not present

## 2023-12-25 DIAGNOSIS — I7090 Unspecified atherosclerosis: Secondary | ICD-10-CM | POA: Diagnosis not present

## 2023-12-25 DIAGNOSIS — M1990 Unspecified osteoarthritis, unspecified site: Secondary | ICD-10-CM | POA: Diagnosis not present

## 2023-12-25 DIAGNOSIS — M720 Palmar fascial fibromatosis [Dupuytren]: Secondary | ICD-10-CM | POA: Diagnosis not present

## 2023-12-25 DIAGNOSIS — E119 Type 2 diabetes mellitus without complications: Secondary | ICD-10-CM | POA: Diagnosis not present

## 2023-12-25 NOTE — Telephone Encounter (Signed)
 Appointment has been made

## 2023-12-25 NOTE — Progress Notes (Signed)
 BP 118/70 (BP Location: Left Arm, Patient Position: Sitting, Cuff Size: Normal)   Pulse 62   Temp 98 F (36.7 C) (Oral)   Ht 5' 10 (1.778 m)   Wt 189 lb 12.8 oz (86.1 kg)   SpO2 98%   BMI 27.23 kg/m    Subjective:    Patient ID: Scott Hester, male    DOB: May 29, 1956, 68 y.o.   MRN: [id number]  HPI: Scott Hester is a 68 y.o. male  Chief Complaint  Patient presents with   THUMB PAIN    Left pain.    THUMB PAIN Duration: about a month Involved hand: left Mechanism of injury: unknown Location: MCP Onset: sudden Severity: severe  Quality: burning, aching, sore Frequency: constant Radiation: no Aggravating factors: movement Alleviating factors: voltaren  a little Treatments attempted: voltaren , rest Relief with NSAIDs?: mild Weakness: no Numbness: yes Redness: no Swelling:yes Bruising: no Fevers: no  Relevant past medical, surgical, family and social history reviewed and updated as indicated. Interim medical history since our last visit reviewed. Allergies and medications reviewed and updated.  Review of Systems  Constitutional: Negative.   Respiratory: Negative.    Cardiovascular: Negative.   Musculoskeletal:  Positive for arthralgias and joint swelling. Negative for back pain, gait problem, myalgias, neck pain and neck stiffness.  Skin: Negative.   Neurological: Negative.   Psychiatric/Behavioral: Negative.      Per HPI unless specifically indicated above     Objective:    BP 118/70 (BP Location: Left Arm, Patient Position: Sitting, Cuff Size: Normal)   Pulse 62   Temp 98 F (36.7 C) (Oral)   Ht 5' 10 (1.778 m)   Wt 189 lb 12.8 oz (86.1 kg)   SpO2 98%   BMI 27.23 kg/m   Wt Readings from Last 3 Encounters:  12/25/23 189 lb 12.8 oz (86.1 kg)  11/29/23 185 lb 6.4 oz (84.1 kg)  08/28/23 196 lb (88.9 kg)    Physical Exam Vitals and nursing note reviewed.  Constitutional:      General: He is not in acute distress.    Appearance: Normal  appearance. He is well-developed.  HENT:     Head: Normocephalic and atraumatic.     Right Ear: Hearing and external ear normal.     Left Ear: Hearing and external ear normal.     Nose: Nose normal.     Mouth/Throat:     Mouth: Mucous membranes are moist.     Pharynx: Oropharynx is clear.   Eyes:     General: Lids are normal. No scleral icterus.       Right eye: No discharge.        Left eye: No discharge.     Conjunctiva/sclera: Conjunctivae normal.   Pulmonary:     Effort: Pulmonary effort is normal. No respiratory distress.   Musculoskeletal:        General: Swelling and tenderness present.     Comments: Mild swelling L 1st MCP, decreased range of motion, tenderness to palpation of 1st MCP, no tenderness to palpation of tendons, unable to get thumb into hand for finkelstein's test, but no pain with ulnar deviation   Skin:    Coloration: Skin is not jaundiced or pale.     Findings: No bruising, erythema, lesion or rash.   Neurological:     General: No focal deficit present.     Mental Status: He is alert and oriented to person, place, and time. Mental status is at baseline.  Psychiatric:        Mood and Affect: Mood normal.        Speech: Speech normal.        Behavior: Behavior normal.        Thought Content: Thought content normal.        Judgment: Judgment normal.     Results for orders placed or performed in visit on 11/29/23  Bayer DCA Hb A1c Waived   Collection Time: 11/29/23  8:23 AM  Result Value Ref Range   HB A1C (BAYER DCA - WAIVED) 6.7 (H) 4.8 - 5.6 %      Assessment & Plan:   Problem List Items Addressed This Visit   None Visit Diagnoses       Pain of left thumb    -  Primary   Will get him into ortho for evaluation. Likely arthritis. Await their input.        Follow up plan: Return for As scheduled.

## 2024-03-03 ENCOUNTER — Encounter: Payer: Self-pay | Admitting: Family Medicine

## 2024-03-03 ENCOUNTER — Ambulatory Visit (INDEPENDENT_AMBULATORY_CARE_PROVIDER_SITE_OTHER): Admitting: Family Medicine

## 2024-03-03 VITALS — BP 109/61 | HR 61 | Temp 98.0°F | Ht 70.0 in | Wt 186.6 lb

## 2024-03-03 DIAGNOSIS — N401 Enlarged prostate with lower urinary tract symptoms: Secondary | ICD-10-CM

## 2024-03-03 DIAGNOSIS — E1159 Type 2 diabetes mellitus with other circulatory complications: Secondary | ICD-10-CM | POA: Diagnosis not present

## 2024-03-03 DIAGNOSIS — N138 Other obstructive and reflux uropathy: Secondary | ICD-10-CM

## 2024-03-03 DIAGNOSIS — E1169 Type 2 diabetes mellitus with other specified complication: Secondary | ICD-10-CM | POA: Diagnosis not present

## 2024-03-03 DIAGNOSIS — E119 Type 2 diabetes mellitus without complications: Secondary | ICD-10-CM | POA: Diagnosis not present

## 2024-03-03 DIAGNOSIS — I152 Hypertension secondary to endocrine disorders: Secondary | ICD-10-CM

## 2024-03-03 DIAGNOSIS — E785 Hyperlipidemia, unspecified: Secondary | ICD-10-CM | POA: Diagnosis not present

## 2024-03-03 DIAGNOSIS — D692 Other nonthrombocytopenic purpura: Secondary | ICD-10-CM

## 2024-03-03 LAB — BAYER DCA HB A1C WAIVED: HB A1C (BAYER DCA - WAIVED): 6.7 % — ABNORMAL HIGH (ref 4.8–5.6)

## 2024-03-03 MED ORDER — BENAZEPRIL HCL 40 MG PO TABS: 40.0000 mg | ORAL_TABLET | Freq: Every day | ORAL | 1 refills | Status: AC

## 2024-03-03 MED ORDER — ATORVASTATIN CALCIUM 40 MG PO TABS: 40.0000 mg | ORAL_TABLET | Freq: Every day | ORAL | 1 refills | Status: AC

## 2024-03-03 MED ORDER — IRON (FERROUS SULFATE) 325 (65 FE) MG PO TABS: 325.0000 mg | ORAL_TABLET | Freq: Every day | ORAL | 1 refills | Status: AC

## 2024-03-03 MED ORDER — TAMSULOSIN HCL 0.4 MG PO CAPS: 0.4000 mg | ORAL_CAPSULE | Freq: Every day | ORAL | 1 refills | Status: AC

## 2024-03-03 MED ORDER — FINASTERIDE 5 MG PO TABS: 5.0000 mg | ORAL_TABLET | Freq: Every day | ORAL | 1 refills | Status: AC

## 2024-03-03 MED ORDER — METFORMIN HCL ER 750 MG PO TB24: 750.0000 mg | ORAL_TABLET | Freq: Two times a day (BID) | ORAL | 1 refills | Status: AC

## 2024-03-03 NOTE — Assessment & Plan Note (Signed)
 Under good control on current regimen. Continue current regimen. Continue to monitor. Call with any concerns. Refills given. Labs drawn today.

## 2024-03-03 NOTE — Progress Notes (Signed)
 BP 109/61   Pulse 61   Temp 98 F (36.7 C) (Oral)   Ht 5' 10 (1.778 m)   Wt 186 lb 9.6 oz (84.6 kg)   SpO2 99%   BMI 26.77 kg/m    Subjective:    Patient ID: Scott Hester, male    DOB: Sep 23, 1955, 68 y.o.   MRN: [id number]  HPI: Scott Hester is a 68 y.o. male  Chief Complaint  Patient presents with   Diabetes   DIABETES Hypoglycemic episodes:no Polydipsia/polyuria: no Visual disturbance: no Chest pain: no Paresthesias: no Glucose Monitoring: no Taking Insulin ?: no Blood Pressure Monitoring: not checking Retinal Examination: Up to Date Foot Exam: Up to Date Diabetic Education: Completed Pneumovax: Up to Date Influenza: Not up to Date Aspirin: no  HYPERTENSION / HYPERLIPIDEMIA Satisfied with current treatment? yes Duration of hypertension: chronic BP monitoring frequency: not checking BP medication side effects: no Past BP meds: benazepril  Duration of hyperlipidemia: chronic Cholesterol medication side effects: no Cholesterol supplements: none Past cholesterol medications: atorvastatin  Medication compliance: excellent compliance Aspirin: no Recent stressors: no Recurrent headaches: no Visual changes: no Palpitations: no Dyspnea: no Chest pain: no Lower extremity edema: no Dizzy/lightheaded: no  BPH BPH status: controlled Satisfied with current treatment?: yes Medication side effects: no Medication compliance: excellent compliance Duration: chronic Nocturia: 1-2x per night Urinary frequency:no Incomplete voiding: no Urgency: no Weak urinary stream: no Straining to start stream: no Dysuria: no Onset: gradual Severity: mild   Relevant past medical, surgical, family and social history reviewed and updated as indicated. Interim medical history since our last visit reviewed. Allergies and medications reviewed and updated.  Review of Systems  Constitutional: Negative.   Respiratory: Negative.    Cardiovascular: Negative.    Gastrointestinal: Negative.   Musculoskeletal: Negative.   Neurological: Negative.   Psychiatric/Behavioral: Negative.      Per HPI unless specifically indicated above     Objective:    BP 109/61   Pulse 61   Temp 98 F (36.7 C) (Oral)   Ht 5' 10 (1.778 m)   Wt 186 lb 9.6 oz (84.6 kg)   SpO2 99%   BMI 26.77 kg/m   Wt Readings from Last 3 Encounters:  03/03/24 186 lb 9.6 oz (84.6 kg)  12/25/23 189 lb 12.8 oz (86.1 kg)  11/29/23 185 lb 6.4 oz (84.1 kg)    Physical Exam Vitals and nursing note reviewed.  Constitutional:      General: He is not in acute distress.    Appearance: Normal appearance. He is not ill-appearing, toxic-appearing or diaphoretic.  HENT:     Head: Normocephalic and atraumatic.     Right Ear: External ear normal.     Left Ear: External ear normal.     Nose: Nose normal.     Mouth/Throat:     Mouth: Mucous membranes are moist.     Pharynx: Oropharynx is clear.  Eyes:     General: No scleral icterus.       Right eye: No discharge.        Left eye: No discharge.     Extraocular Movements: Extraocular movements intact.     Conjunctiva/sclera: Conjunctivae normal.     Pupils: Pupils are equal, round, and reactive to light.  Cardiovascular:     Rate and Rhythm: Normal rate and regular rhythm.     Pulses: Normal pulses.     Heart sounds: Normal heart sounds. No murmur heard.    No friction rub. No gallop.  Pulmonary:  Effort: Pulmonary effort is normal. No respiratory distress.     Breath sounds: Normal breath sounds. No stridor. No wheezing, rhonchi or rales.  Chest:     Chest wall: No tenderness.  Musculoskeletal:        General: Normal range of motion.     Cervical back: Normal range of motion and neck supple.  Skin:    General: Skin is warm and dry.     Capillary Refill: Capillary refill takes less than 2 seconds.     Coloration: Skin is not jaundiced or pale.     Findings: No bruising, erythema, lesion or rash.  Neurological:      General: No focal deficit present.     Mental Status: He is alert and oriented to person, place, and time. Mental status is at baseline.  Psychiatric:        Mood and Affect: Mood normal.        Behavior: Behavior normal.        Thought Content: Thought content normal.        Judgment: Judgment normal.     Results for orders placed or performed in visit on 11/29/23  Bayer DCA Hb A1c Waived   Collection Time: 11/29/23  8:23 AM  Result Value Ref Range   HB A1C (BAYER DCA - WAIVED) 6.7 (H) 4.8 - 5.6 %      Assessment & Plan:   Problem List Items Addressed This Visit       Cardiovascular and Mediastinum   Hypertension associated with diabetes (HCC)   Under good control on current regimen. Continue current regimen. Continue to monitor. Call with any concerns. Refills given. Labs drawn today.        Relevant Medications   atorvastatin  (LIPITOR) 40 MG tablet   benazepril  (LOTENSIN ) 40 MG tablet   metFORMIN  (GLUCOPHAGE -XR) 750 MG 24 hr tablet   Other Relevant Orders   CBC with Differential/Platelet   Comprehensive metabolic panel with GFR   Senile purpura (HCC)   Reassured patient. Continue to monitor.       Relevant Medications   atorvastatin  (LIPITOR) 40 MG tablet   benazepril  (LOTENSIN ) 40 MG tablet     Endocrine   Controlled type 2 diabetes mellitus without complication, without long-term current use of insulin  (HCC) - Primary   Doing well with A1c stable at 6.7. Continue current regimen. Continue to monitor. Call with any concerns.       Relevant Medications   atorvastatin  (LIPITOR) 40 MG tablet   benazepril  (LOTENSIN ) 40 MG tablet   metFORMIN  (GLUCOPHAGE -XR) 750 MG 24 hr tablet   Other Relevant Orders   Bayer DCA Hb A1c Waived   CBC with Differential/Platelet   Comprehensive metabolic panel with GFR   Hyperlipidemia associated with type 2 diabetes mellitus (HCC)   Under good control on current regimen. Continue current regimen. Continue to monitor. Call with any  concerns. Refills given. Labs drawn today.       Relevant Medications   atorvastatin  (LIPITOR) 40 MG tablet   benazepril  (LOTENSIN ) 40 MG tablet   metFORMIN  (GLUCOPHAGE -XR) 750 MG 24 hr tablet   Other Relevant Orders   CBC with Differential/Platelet   Lipid Panel w/o Chol/HDL Ratio   Comprehensive metabolic panel with GFR     Genitourinary   BPH with obstruction/lower urinary tract symptoms   Under good control on current regimen. Continue current regimen. Continue to monitor. Call with any concerns. Refills given. Labs drawn today.       Relevant  Medications   finasteride  (PROSCAR ) 5 MG tablet   tamsulosin  (FLOMAX ) 0.4 MG CAPS capsule   Other Relevant Orders   CBC with Differential/Platelet   Comprehensive metabolic panel with GFR   PSA     Follow up plan: Return in about 3 months (around 06/03/2024).

## 2024-03-03 NOTE — Assessment & Plan Note (Signed)
 Doing well with A1c stable at 6.7. Continue current regimen. Continue to monitor. Call with any concerns.

## 2024-03-03 NOTE — Assessment & Plan Note (Signed)
 Reassured patient. Continue to monitor.

## 2024-03-04 LAB — CBC WITH DIFFERENTIAL/PLATELET
Basophils Absolute: 0.1 x10E3/uL (ref 0.0–0.2)
Basos: 1 %
EOS (ABSOLUTE): 0.4 x10E3/uL (ref 0.0–0.4)
Eos: 7 %
Hematocrit: 39.3 % (ref 37.5–51.0)
Hemoglobin: 13 g/dL (ref 13.0–17.7)
Immature Grans (Abs): 0 x10E3/uL (ref 0.0–0.1)
Immature Granulocytes: 0 %
Lymphocytes Absolute: 1.5 x10E3/uL (ref 0.7–3.1)
Lymphs: 23 %
MCH: 30.4 pg (ref 26.6–33.0)
MCHC: 33.1 g/dL (ref 31.5–35.7)
MCV: 92 fL (ref 79–97)
Monocytes Absolute: 0.4 x10E3/uL (ref 0.1–0.9)
Monocytes: 6 %
Neutrophils Absolute: 4.2 x10E3/uL (ref 1.4–7.0)
Neutrophils: 63 %
Platelets: 205 x10E3/uL (ref 150–450)
RBC: 4.27 x10E6/uL (ref 4.14–5.80)
RDW: 12.3 % (ref 11.6–15.4)
WBC: 6.5 x10E3/uL (ref 3.4–10.8)

## 2024-03-04 LAB — COMPREHENSIVE METABOLIC PANEL WITH GFR
ALT: 19 IU/L (ref 0–44)
AST: 21 IU/L (ref 0–40)
Albumin: 4.3 g/dL (ref 3.9–4.9)
Alkaline Phosphatase: 68 IU/L (ref 44–121)
BUN/Creatinine Ratio: 24 (ref 10–24)
BUN: 20 mg/dL (ref 8–27)
Bilirubin Total: 0.7 mg/dL (ref 0.0–1.2)
CO2: 17 mmol/L — ABNORMAL LOW (ref 20–29)
Calcium: 9.5 mg/dL (ref 8.6–10.2)
Chloride: 103 mmol/L (ref 96–106)
Creatinine, Ser: 0.84 mg/dL (ref 0.76–1.27)
Globulin, Total: 2.7 g/dL (ref 1.5–4.5)
Glucose: 142 mg/dL — ABNORMAL HIGH (ref 70–99)
Potassium: 5.1 mmol/L (ref 3.5–5.2)
Sodium: 140 mmol/L (ref 134–144)
Total Protein: 7 g/dL (ref 6.0–8.5)
eGFR: 96 mL/min/1.73 (ref >59)

## 2024-03-04 LAB — PSA: Prostate Specific Ag, Serum: 0.5 ng/mL (ref 0.0–4.0)

## 2024-03-04 LAB — LIPID PANEL W/O CHOL/HDL RATIO
Cholesterol, Total: 131 mg/dL (ref 100–199)
HDL: 44 mg/dL (ref >39)
LDL Chol Calc (NIH): 71 mg/dL (ref 0–99)
Triglycerides: 84 mg/dL (ref 0–149)
VLDL Cholesterol Cal: 16 mg/dL (ref 5–40)

## 2024-03-05 ENCOUNTER — Ambulatory Visit: Payer: Self-pay | Admitting: Family Medicine

## 2024-03-06 DIAGNOSIS — E119 Type 2 diabetes mellitus without complications: Secondary | ICD-10-CM | POA: Diagnosis not present

## 2024-03-06 DIAGNOSIS — H2513 Age-related nuclear cataract, bilateral: Secondary | ICD-10-CM | POA: Diagnosis not present

## 2024-03-06 LAB — HM DIABETES EYE EXAM

## 2024-06-03 ENCOUNTER — Ambulatory Visit (INDEPENDENT_AMBULATORY_CARE_PROVIDER_SITE_OTHER): Admitting: Family Medicine

## 2024-06-03 ENCOUNTER — Encounter: Payer: Self-pay | Admitting: Family Medicine

## 2024-06-03 VITALS — BP 121/66 | HR 57 | Temp 98.0°F | Ht 70.0 in | Wt 192.8 lb

## 2024-06-03 DIAGNOSIS — E1129 Type 2 diabetes mellitus with other diabetic kidney complication: Secondary | ICD-10-CM

## 2024-06-03 DIAGNOSIS — Z7984 Long term (current) use of oral hypoglycemic drugs: Secondary | ICD-10-CM

## 2024-06-03 DIAGNOSIS — E119 Type 2 diabetes mellitus without complications: Secondary | ICD-10-CM | POA: Diagnosis not present

## 2024-06-03 DIAGNOSIS — Z23 Encounter for immunization: Secondary | ICD-10-CM | POA: Diagnosis not present

## 2024-06-03 DIAGNOSIS — R809 Proteinuria, unspecified: Secondary | ICD-10-CM

## 2024-06-03 LAB — BAYER DCA HB A1C WAIVED: HB A1C (BAYER DCA - WAIVED): 6.3 % — ABNORMAL HIGH (ref 4.8–5.6)

## 2024-06-03 NOTE — Progress Notes (Signed)
 BP 121/66   Pulse (!) 57   Temp 98 F (36.7 C) (Oral)   Ht 5' 10 (1.778 m)   Wt 192 lb 12.8 oz (87.5 kg)   SpO2 96%   BMI 27.66 kg/m    Subjective:    Patient ID: Scott Hester, male    DOB: [birth year], 68 y.o.   MRN: [id number]  HPI: Scott Hester is a 68 y.o. male  Chief Complaint  Patient presents with   Diabetes   DIABETES Hypoglycemic episodes:no Polydipsia/polyuria: no Visual disturbance: no Chest pain: no Paresthesias: no Glucose Monitoring: yes  Accucheck frequency: occasionally Taking Insulin ?: no Blood Pressure Monitoring: not checking Retinal Examination: Up to Date Foot Exam: Up to Date Diabetic Education: Completed Pneumovax: Up to Date Influenza: Up to Date Aspirin: yes   Relevant past medical, surgical, family and social history reviewed and updated as indicated. Interim medical history since our last visit reviewed. Allergies and medications reviewed and updated.  Review of Systems  Constitutional: Negative.   Respiratory: Negative.    Cardiovascular: Negative.   Gastrointestinal: Negative.   Musculoskeletal: Negative.   Psychiatric/Behavioral: Negative.      Per HPI unless specifically indicated above     Objective:    BP 121/66   Pulse (!) 57   Temp 98 F (36.7 C) (Oral)   Ht 5' 10 (1.778 m)   Wt 192 lb 12.8 oz (87.5 kg)   SpO2 96%   BMI 27.66 kg/m   Wt Readings from Last 3 Encounters:  06/03/24 192 lb 12.8 oz (87.5 kg)  03/03/24 186 lb 9.6 oz (84.6 kg)  12/25/23 189 lb 12.8 oz (86.1 kg)    Physical Exam Vitals and nursing note reviewed.  Constitutional:      General: He is not in acute distress.    Appearance: Normal appearance. He is not ill-appearing, toxic-appearing or diaphoretic.  HENT:     Head: Normocephalic and atraumatic.     Right Ear: External ear normal.     Left Ear: External ear normal.     Nose: Nose normal.     Mouth/Throat:     Mouth: Mucous membranes are moist.     Pharynx: Oropharynx is  clear.  Eyes:     General: No scleral icterus.       Right eye: No discharge.        Left eye: No discharge.     Extraocular Movements: Extraocular movements intact.     Conjunctiva/sclera: Conjunctivae normal.     Pupils: Pupils are equal, round, and reactive to light.  Cardiovascular:     Rate and Rhythm: Normal rate and regular rhythm.     Pulses: Normal pulses.     Heart sounds: Normal heart sounds. No murmur heard.    No friction rub. No gallop.  Pulmonary:     Effort: Pulmonary effort is normal. No respiratory distress.     Breath sounds: Normal breath sounds. No stridor. No wheezing, rhonchi or rales.  Chest:     Chest wall: No tenderness.  Musculoskeletal:        General: Normal range of motion.     Cervical back: Normal range of motion and neck supple.  Skin:    General: Skin is warm and dry.     Capillary Refill: Capillary refill takes less than 2 seconds.     Coloration: Skin is not jaundiced or pale.     Findings: No bruising, erythema, lesion or rash.  Neurological:  General: No focal deficit present.     Mental Status: He is alert and oriented to person, place, and time. Mental status is at baseline.  Psychiatric:        Mood and Affect: Mood normal.        Behavior: Behavior normal.        Thought Content: Thought content normal.        Judgment: Judgment normal.     Results for orders placed or performed in visit on 03/11/24  HM DIABETES EYE EXAM   Collection Time: 03/06/24  3:00 PM  Result Value Ref Range   HM Diabetic Eye Exam No Retinopathy No Retinopathy      Assessment & Plan:   Problem List Items Addressed This Visit       Endocrine   Controlled type 2 diabetes mellitus with microalbuminuria, without long-term current use of insulin  (HCC) - Primary   Doing great with A1c of 6.4. Continue current regimen. Continue to monitor. Call with any concerns. Recheck 3 months.       Relevant Orders   Bayer DCA Hb A1c Waived   Other Visit  Diagnoses       Needs flu shot       Relevant Orders   Flu vaccine HIGH DOSE PF(Fluzone Trivalent) (Completed)        Follow up plan: Return in about 3 months (around 09/03/2024) for physical.

## 2024-06-03 NOTE — Assessment & Plan Note (Signed)
 Doing great with A1c of 6.4. Continue current regimen. Continue to monitor. Call with any concerns. Recheck 3 months.

## 2024-09-04 ENCOUNTER — Encounter: Admitting: Family Medicine
